# Patient Record
Sex: Female | Born: 1935 | Race: White | Hispanic: No | Smoking: Never smoker
Health system: Southern US, Community
[De-identification: ages and names within clinical notes are randomized; demographics above are authoritative.]

## PROBLEM LIST (undated history)

## (undated) DIAGNOSIS — S32592A Other specified fracture of left pubis, initial encounter for closed fracture: Secondary | ICD-10-CM

## (undated) DIAGNOSIS — H3530 Unspecified macular degeneration: Secondary | ICD-10-CM

## (undated) DIAGNOSIS — N390 Urinary tract infection, site not specified: Secondary | ICD-10-CM

## (undated) DIAGNOSIS — M1990 Unspecified osteoarthritis, unspecified site: Secondary | ICD-10-CM

## (undated) DIAGNOSIS — F419 Anxiety disorder, unspecified: Secondary | ICD-10-CM

## (undated) DIAGNOSIS — I10 Essential (primary) hypertension: Secondary | ICD-10-CM

## (undated) DIAGNOSIS — S43006A Unspecified dislocation of unspecified shoulder joint, initial encounter: Secondary | ICD-10-CM

## (undated) DIAGNOSIS — F40240 Claustrophobia: Secondary | ICD-10-CM

## (undated) DIAGNOSIS — B029 Zoster without complications: Secondary | ICD-10-CM

## (undated) DIAGNOSIS — E039 Hypothyroidism, unspecified: Secondary | ICD-10-CM

## (undated) HISTORY — PX: FRACTURE SURGERY: SHX138

## (undated) HISTORY — PX: TONSILLECTOMY: SUR1361

## (undated) HISTORY — PX: APPENDECTOMY: SHX54

## (undated) HISTORY — PX: THYROIDECTOMY: SHX17

## (undated) HISTORY — PX: COLONOSCOPY: SHX174

## (undated) HISTORY — PX: CATARACT EXTRACTION W/ INTRAOCULAR LENS  IMPLANT, BILATERAL: SHX1307

## (undated) HISTORY — PX: CATARACT EXTRACTION, BILATERAL: SHX1313

## (undated) HISTORY — PX: CHOLECYSTECTOMY: SHX55

---

## 1997-11-23 ENCOUNTER — Ambulatory Visit (HOSPITAL_COMMUNITY): Admission: RE | Admit: 1997-11-23 | Discharge: 1997-11-23 | Payer: Self-pay | Admitting: Endocrinology

## 1998-05-06 ENCOUNTER — Other Ambulatory Visit: Admission: RE | Admit: 1998-05-06 | Discharge: 1998-05-06 | Payer: Self-pay | Admitting: Endocrinology

## 1999-05-19 ENCOUNTER — Other Ambulatory Visit: Admission: RE | Admit: 1999-05-19 | Discharge: 1999-05-19 | Payer: Self-pay | Admitting: Endocrinology

## 2000-05-24 ENCOUNTER — Other Ambulatory Visit: Admission: RE | Admit: 2000-05-24 | Discharge: 2000-05-24 | Payer: Self-pay | Admitting: Endocrinology

## 2001-10-27 HISTORY — PX: HIP FRACTURE SURGERY: SHX118

## 2001-10-30 ENCOUNTER — Encounter: Payer: Self-pay | Admitting: Emergency Medicine

## 2001-10-30 ENCOUNTER — Inpatient Hospital Stay (HOSPITAL_COMMUNITY): Admission: EM | Admit: 2001-10-30 | Discharge: 2001-11-04 | Payer: Self-pay | Admitting: Emergency Medicine

## 2001-10-30 ENCOUNTER — Encounter: Payer: Self-pay | Admitting: Orthopedic Surgery

## 2001-11-04 ENCOUNTER — Inpatient Hospital Stay
Admission: AD | Admit: 2001-11-04 | Discharge: 2001-11-15 | Payer: Self-pay | Admitting: Physical Medicine & Rehabilitation

## 2002-06-26 ENCOUNTER — Other Ambulatory Visit: Admission: RE | Admit: 2002-06-26 | Discharge: 2002-06-26 | Payer: Self-pay | Admitting: Endocrinology

## 2002-12-09 ENCOUNTER — Ambulatory Visit (HOSPITAL_COMMUNITY): Admission: RE | Admit: 2002-12-09 | Discharge: 2002-12-09 | Payer: Self-pay | Admitting: *Deleted

## 2002-12-09 ENCOUNTER — Encounter (INDEPENDENT_AMBULATORY_CARE_PROVIDER_SITE_OTHER): Payer: Self-pay | Admitting: Specialist

## 2006-04-12 ENCOUNTER — Ambulatory Visit (HOSPITAL_COMMUNITY): Admission: RE | Admit: 2006-04-12 | Discharge: 2006-04-12 | Payer: Self-pay | Admitting: *Deleted

## 2007-07-13 ENCOUNTER — Emergency Department (HOSPITAL_COMMUNITY): Admission: EM | Admit: 2007-07-13 | Discharge: 2007-07-13 | Payer: Self-pay | Admitting: Emergency Medicine

## 2008-10-05 ENCOUNTER — Emergency Department (HOSPITAL_COMMUNITY): Admission: EM | Admit: 2008-10-05 | Discharge: 2008-10-05 | Payer: Self-pay | Admitting: Emergency Medicine

## 2010-08-14 ENCOUNTER — Emergency Department (HOSPITAL_COMMUNITY)
Admission: EM | Admit: 2010-08-14 | Discharge: 2010-08-14 | Disposition: A | Discharge status: Home / Self Care | Payer: Medicare Other | Attending: Emergency Medicine | Admitting: Emergency Medicine

## 2010-08-14 ENCOUNTER — Emergency Department (HOSPITAL_COMMUNITY): Payer: Medicare Other

## 2010-08-14 DIAGNOSIS — S42213A Unspecified displaced fracture of surgical neck of unspecified humerus, initial encounter for closed fracture: Secondary | ICD-10-CM | POA: Insufficient documentation

## 2010-08-14 DIAGNOSIS — E039 Hypothyroidism, unspecified: Secondary | ICD-10-CM | POA: Insufficient documentation

## 2010-08-14 DIAGNOSIS — W1809XA Striking against other object with subsequent fall, initial encounter: Secondary | ICD-10-CM | POA: Insufficient documentation

## 2010-08-14 DIAGNOSIS — M25519 Pain in unspecified shoulder: Secondary | ICD-10-CM | POA: Insufficient documentation

## 2010-08-14 DIAGNOSIS — I10 Essential (primary) hypertension: Secondary | ICD-10-CM | POA: Insufficient documentation

## 2010-10-14 NOTE — Discharge Summary (Signed)
[place]. [facility]  Patient:    Misty Shah, Misty Shah Visit Number: [id number] MRN: [id number]          Service Type: ECR Location: SACU 4524 01 Attending Physician:  Herold Harms Dictated by:   Jamelle Rushing, P.A. Admit Date:  11/04/2001 Discharge Date: 11/15/2001                             Discharge Summary  ADMISSION DIAGNOSIS: 1. Right intertrochanteric femur fracture. 2. Hypertension. 3. Osteoporosis. 4. Hypothyroidism. 5. Anxiety, claustrophobia. 6. Diverticulosis. 7. Hemorrhoids.  DISCHARGE DIAGNOSES: 1. Right hip intertrochanteric fracture repair with compression screw and    plate. 2. Postoperative blood loss anemia. 3. Hypertension. 4. Anxiety, claustrophobia. 5. Diverticulosis. 6. Osteoporosis.  HISTORY OF PRESENT ILLNESS:  The patient is an 77 year old white female who was sleeping on the couch on the evening of the injury and tried to get up. She lost her balance and fell, landing on her right side. The patient complained of immediate right groin pain. She denied any chest pain, shortness of breath, vertigo or loss of consciousness. She was unable to stand. She crawled to get help via the phone. She was brought to the emergency room by EMS for evaluation.  ALLERGIES:  NOVOCAINE WITH EPINEPHRINE CAUSING PALPITATIONS AND HOT FLASHES.  CURRENT MEDICATIONS:  1. Demodex 20 mg p.o. q.d.  2. Lotrel 10/20 mg p.o. q.d.  3. Synthroid 100 mcg p.o. q.d.  4. Toprol XL 100 mg p.o. q.d.  5. Aspirin 81 mg p.o. q.d.  6. Vitamin C 500 mg p.o. q.d.  7. Multivitamins 1 tablet p.o. q.d.  8. Valium 2 mg p.o. q.d. p.r.n.  9. Allegra 180 mg p.o. q.d. p.r.n. 10. Hydrocortisone cream p.r. p.r.n.  SURGICAL PROCEDURE:  On October 30, 2001, the patient was taken to the operating room by Dr. Jonny Ruiz L. Rendall, assisted by Maple Hudson, P.A.-S. Under general anesthesia the patient underwent a manipulation and compression screw fixation of her right hip  fracture. The patient tolerated the procedure well. The operative time was approximately 52 minutes with approximately 250 cc of blood loss. The patient tolerated the procedure well and was returned to the recovery room in good condition.  CONSULTS: 1. Routine physical therapy. 2. Occupational therapy. 3. Case Production designer, theatre/television/film. 4. Rehabilitation consult was requested. 5. Medical consult with Dr. Renne Crigler was requested for evaluation of the    patients hypertension status.  HOSPITAL COURSE:  On October 30, 2001, the patient was admitted to [facility] through the emergency room and was taken to the operating room for a manipulation and compression screw fixation of her right intertrochanteric fracture. The patient tolerated the procedure well and was transferred to the PACU and then to the orthopedic floor for routine postoperative care. The patient then incurred some postoperative blood loss anemia with low blood pressures and feeling tired and worn out. She was transfused 2 units of packed red blood cells and her hemoglobin and hematocrit jumped from a 7.6 and 21.9 to a 10.7 and 31.0. The patient received a total of 2 units of packed red blood cells.  The patients blood pressure medications were held and she still continued to have low 130s to 100s systolic pressure, so the patients primary care physician was consulted for evaluation of her blood pressure medications. This was done. The medications were adjusted and continued follow up on an outpatient basis was recommended.  Otherwise the patients wound  remained benign. Her other vital signs remained stable. Her labs also remained stable. She worked well with physical therapy and it was felt that due to her progression and her needs postoperatively that a stay on the subacute care unit would be beneficial prior to being discharged to home. Recommendations were made and she was in fact accepted on postoperative day #5 and  transferred.  LABORATORY DATA:  Electrocardiogram on admission was normal sinus rhythm, ST abnormality, possible digitalis effect at 85 beats per minute, PRT axis 77, 10 and 66. Chest x-ray showed COPD, no acute disease.  A CBC on November 03, 2001, WBCs 9.5, hemoglobin 10.1, hematocrit 29.4, platelets 184. November 01, 2001, routine chemistries, sodium 137, potassium 3.4, glucose 137 down from a high of 219, BUN 11, creatinine 0.8.  DISCHARGE MEDICATIONS:  On discharge to the subacute care unit.  1. Lotensin 20 mg p.o. q.d.  2. Synthroid 100 mcg p.o. q.d.  3. Toprol XL 100 mg p.o. q.d.  4. Multivitamins with minerals 1 capsule p.o. q.d.  5. Ascorbic acid 500 mg p.o. q.d.  6. Arixtra 2.5 mg subcu q.24h.  7. Colace 100 mg p.o. b.i.d.  8. Reglan 10 mg p.o. q.4-6h. p.r.n.  9. Valium 2 mg p.o. q.d. p.r.n. 10. Claritin 10 mg p.o. q.d. p.r.n. 11. Anusol p.r.n. 12. Laxative or enema or choice p.r.n. 13. Tylenol 325 mg p.o. q.4-6h. p.r.n. 14. Percocet 1 to 2 p.o. q.4-6h. p.r.n. 15. Robaxin 500 mg p.o. q.4-6h. p.r.n. 16. Ambien 5 to 10 mg p.o. q.h.s. p.r.n. 17. Vicodin 1 to 2 tablets q.4-6h. p.r.n.  DISCHARGE INSTRUCTIONS:  On discharge to the SACU.  Medications, the patient is to continue medications as dispensed on the orthopedics floor. Arixtra is to be continued for a total of 10 days. Diet no restrictions. Activity, the patient may be weightbearing as tolerated with close supervision and the use of a walker. Wound care, the patient should have her wound checked daily for any signs of infection. Staples are to be removed on postoperative day #14.  FOLLOWUP:  The patient should have a followup appointment in one week from the day of discharge from the rehabilitation unit with Dr. Priscille Kluver.  The patient is to call for an appointment.  CONDITION ON DISCHARGE:  On discharge to the subacute care unit is listed as good. Dictated by:   Jamelle Rushing, P.A. Attending Physician:  Herold Harms DD:  12/01/01 TD:  12/03/01 Job: [id number]

## 2010-10-14 NOTE — Op Note (Signed)
   NAME:  Misty Shah, Misty Shah                            ACCOUNT NO.:  [id number]   MEDICAL RECORD NO.:  [id number]                   PATIENT TYPE:  AMB   LOCATION:  ENDO                                 FACILITY:  [facility]   PHYSICIAN:  Georgiana Spinner, M.D.                 DATE OF BIRTH:  21-Jan-1916   DATE OF PROCEDURE:  DATE OF DISCHARGE:                                 OPERATIVE REPORT   PROCEDURE:  Colonoscopy with biopsy.   INDICATIONS:  Polyp.   ANESTHESIA:  Fentanyl 50 micrograms, Versed 5 mg.   DESCRIPTION OF PROCEDURE:  With the patient mildly sedated in the left  lateral decubitus position, the Olympus videoscopic colonoscope was inserted  in the rectum and passed under direct vision to the cecum, identified by the  crow's foot of the cecum and ileocecal valve, both of which were  photographed.  Near the cecum, was an area that was raised that might have  bee a flat polyp.  It was photographed and was removed using the hot biopsy  forceps technique, setting of 20:20 blended current.  The endoscope then was  withdrawn taking circumferential views of the remaining colonic mucosa  stopping to photograph diverticula along the way, and a very tortuous type  sigmoid colon until it reached the rectum which appeared normal on direct  and showed hemorrhoids on retroflexed view.  The endoscope was straightened  and withdrawn.  The patient's vital signs and pulse oximeter remained  stable.  The patient tolerated the procedure well with no apparent  complications.   FINDINGS:  Diverticulosis of the sigmoid colon, small internal hemorrhoids,  question of polyp at the cecum.   PLAN:  Await pathology report.  The patient will call in for results and  follow up with me as an outpatient.                                               Georgiana Spinner, M.D.    GMO/MEDQ  D:  12/09/2002  T:  12/09/2002  Job:  [id number]

## 2010-10-14 NOTE — Discharge Summary (Signed)
[place]. [facility]  Patient:    Misty Shah, Misty Shah Visit Number: [id number] MRN: [id number]          Service Type: ECR Location: SACU 4524 01 Attending Physician:  Herold Harms Dictated by:   Mcarthur Rossetti. Angiulli, P.A. Admit Date:  11/04/2001 Discharge Date: 11/15/2001   CC:         Misty Shah, M.D.  Misty Shah, M.D.  Misty Shah, M.D.   Discharge Summary  DISCHARGE DIAGNOSES: 1. Right displaced intertrochanteric hip fracture, status post compression    screw fixation, October 30, 2001. 2. Anemia, orthostatic blood pressure resolved. 3. Anxiety. 4. Hypothyroidism. 5. Diverticulosis.  HISTORY OF PRESENT ILLNESS:  [name] admitted on June 4, after a fall without loss of consciousness, sustaining a right displaced intertrochanteric hip fracture.  Underwent compression screw fixation on June 4, per Dr. Priscille Kluver.  Placed on Arixtra for deep venous thrombosis prophylaxis and 50% partial weightbearing.  Postoperative anemia and transfused.  Foley catheter tube removed on November 02, 2001.  Blood pressures with some decrease in changes, orthostatic.  Her Norvasc and Demadex were held per Dr. Juleen China. There was no chest pain or shortness of breath, minimal assistance for ambulation. Latest hemoglobin 10.1, chemistries unremarkable.  Admitted for comprehensive rehabilitation program.  PAST MEDICAL HISTORY:  See discharge diagnoses.  PAST SURGICAL HISTORY:  Cholecystectomy, thyroid surgery, bladder tacking, tonsillectomy, cataract surgery.  ALLERGIES:  NOVOCAINE, EPINEPHRINE.  PRIMARY M.D.:  W. Adela Lank, M.D.  SOCIAL HISTORY:  No alcohol or tobacco.  MEDICATIONS:  Prior to admission medications were Demadex, Lotrelle, Synthroid, Toprol XL, aspirin, vitamin C, Valium, and Allegra.  SOCIAL HISTORY:  Widowed, lives alone in two-level home, one to two steps to entry.  Bedroom is downstairs.  Family in area.  HOSPITAL  COURSE:  The patient did well while on rehabilitation services with therapies initiated daily.  The following issues were followed during the patients rehabilitation course.  Pertaining to [name] right hip intertrochanteric hip fracture, she had undergone compression screw fixation on October 30, 2001, per Dr. Priscille Kluver.  She was 50% partial weightbearing, surgical site healing nicely and no signs of infection.  She had completed her course of Arixtra for deep venous thrombosis prophylaxis.  Venous Doppler studies were negative.  Postoperative anemia is stable at 9.4, hematocrit 27.  Early on hospital course with orthostatic blood pressure changes, her Norvasc and Demadex were held.  She now continued on her Toprol as prior to hospital admission as well as Lotensin.  The patient had been on Lotrel prior to hospital admission and all details had been discussed with the patient.  She would follow up with her primary M.D.  Her blood pressures had remained well controlled with no signs of fluid overload and no headache or dizziness. Diastolic pressure is 55 to 61.  She had a long history of anxiety.  She used Valium as needed on a very limited basis.  Hormone supplement ongoing for hypothyroidism.  She had no gastrointestinal complaints.  She had been on aspirin prior to hospital admission.  This has been resumed at the time of discharge.  Overall for her functional mobility, she was standby assistance for transfers, minimal assistance for steps, ambulating 150 feet at supervision level, simple setup for upper body activities of daily living and lower body dressing.  Home Health physical and occupational therapy would be ongoing.  Latest labs showed a sodium of 135, potassium 4.0, BUN 8, creatinine  0.7, hemoglobin 9.4, hematocrit 27.7.  DISCHARGE MEDICATIONS: 1. Lotensin 20 mg daily. 2. Synthroid 100 mcg daily. 3. Toprol XL 100 mg daily. 4. Multivitamin daily. 5. Vitamin C 500 mg daily. 6.  Aspirin 81 mg daily. 7. Trinsicon twice daily. 8. Valium 2 mg daily as needed. 9. Tylox as needed for pain.  ACTIVITY:  Partial weightbearing with walker.  DIET:  Regular.  WOUND CARE:  Cleanse incision daily with warm soap and water.  DISCHARGE INSTRUCTIONS:  Home Health physical and occupational therapy.  FOLLOW-UP:  The patient should follow up with Dr. Juleen China for medical management, adjustments in blood pressure medications at his discretion. Dictated by:   Mcarthur Rossetti. Angiulli, P.A. Attending Physician:  Herold Harms DD:  11/14/01 TD:  11/15/01 Job: [id number]

## 2010-10-14 NOTE — Op Note (Signed)
[place]. [facility]  Patient:    Misty Shah, DEARMOND Visit Number: [id number] MRN: [id number]          Service Type: MED Location: [id number] Attending Physician:  Carolan Shiver Ii Dictated by:   Carlisle Beers. Dorothyann Gibbs, M.D. Proc. Date: 10/30/01 Admit Date:  10/30/2001                             Operative Report  PREOPERATIVE DIAGNOSIS:  Intertrochanteric fracture, right hip, displaced.  POSTOPERATIVE DIAGNOSIS:  Intertrochanteric fracture, right hip, displaced.  PROCEDURE:  Manipulation and compression screw fixation, right hip fracture.  SURGEON:  John L. Dorothyann Gibbs, M.D.  Threasa HeadsLorin Picket, P.A.-S.  ANESTHESIA:  General.  PATHOLOGY:  The patient is an 77 year old with a displaced intertrochanteric fracture, right hip, a relatively valgus hip.  DESCRIPTION OF PROCEDURE:  Under general anesthesia, the patient is placed on the [facility] table and the right hip is brought into traction and gently manipulated.  C-arm reveals much improved position and alignment.  The hip is then prepared with Duraprep and draped with a vertical hanging plastic field. A guidewire is placed along the femoral shaft for assistance in positioning the skin incision with the C-arm, and then a six-inch lateral hip incision is then made vertically.  Dissection is carried through subcutaneous tissues, splitting the IT band in the line of its fibers.  The vastus lateralis is released posteriorly and elevated from the cortex of the lateral femur, exposing the area just beneath the greater trochanter.  A guidewire is placed up the femoral neck into the femoral head and on the second try, good position and alignment were obtained on the AP and lateral views.  This was then measured for depth and an 85 mm reaming was then done and an 85 mm, five-hole, 145 degree side plate was attached to the femoral shaft.  It was approximated to the shaft with a [place] claw, and five screws were  placed.  AP and lateral x-rays revealed excellent fit and alignment of the device in place.  The wound was then irrigated and closed in layers with #1 Vicryl, 0 Vicryl, and 2-0 Vicryl and skin clips.  Operative time approximately 52 minutes.  Blood loss estimated at 250 cc.  A sterile compression bandage was applied, and the patient returned to recovery in good condition. Dictated by:   Carlisle Beers. Dorothyann Gibbs, M.D. Attending Physician:  Carolan Shiver Ii DD:  10/30/01 TD:  11/01/01 Job: [id number]

## 2010-10-14 NOTE — Op Note (Signed)
NAMEKIELY, COUSAR                  ACCOUNT NO.:  [id number]   MEDICAL RECORD NO.:  [id number]          PATIENT TYPE:  AMB   LOCATION:  ENDO                         FACILITY:  [facility]   PHYSICIAN:  Georgiana Spinner, M.D.    DATE OF BIRTH:  11-07-1915   DATE OF PROCEDURE:  DATE OF DISCHARGE:                                 OPERATIVE REPORT   PROCEDURE:  Colonoscopy.   INDICATIONS:  Rectal bleeding.   ANESTHESIA:  Fentanyl 60 mg, Versed 6 mg.   DESCRIPTION OF PROCEDURE:  With patient mildly sedated in the left lateral  decubitus position, the Olympus video colonoscope was inserted into the  rectum and passed under direct digital pressure, whereupon we reached the  cecum, identified by ileocecal valve and base of cecum, both of which were  photographed.  From this point, the colonoscope was slowly withdrawn, taking  circumferential views of colonic mucosa, stopping in the rectum which  appeared normal and showed hemorrhoids on retroflexed view.  The endoscope  was straightened and withdrawn.  The patient's vital signs and pulse  oximeter remained stable.  The patient tolerated the procedure well with no  apparent complications.   FINDINGS:  Occasional diverticula and internal hemorrhoids, otherwise  unremarkable exam.   PLAN:  Have the patient follow up with me on as needed basis only.           ______________________________  Georgiana Spinner, M.D.     GMO/MEDQ  D:  04/12/2006  T:  04/13/2006  Job:  [id number]

## 2011-09-25 ENCOUNTER — Inpatient Hospital Stay (HOSPITAL_COMMUNITY)
Admission: EM | Admit: 2011-09-25 | Discharge: 2011-09-28 | DRG: 481 | Disposition: A | Discharge status: Rehabilitation Facility | Payer: Medicare Other | Attending: Internal Medicine | Admitting: Internal Medicine

## 2011-09-25 ENCOUNTER — Inpatient Hospital Stay (HOSPITAL_COMMUNITY): Payer: Medicare Other | Admitting: Anesthesiology

## 2011-09-25 ENCOUNTER — Emergency Department (HOSPITAL_COMMUNITY): Payer: Medicare Other

## 2011-09-25 ENCOUNTER — Inpatient Hospital Stay (HOSPITAL_COMMUNITY): Payer: Medicare Other

## 2011-09-25 ENCOUNTER — Encounter (HOSPITAL_COMMUNITY): Payer: Self-pay | Admitting: Anesthesiology

## 2011-09-25 ENCOUNTER — Encounter (HOSPITAL_COMMUNITY): Payer: Self-pay | Admitting: *Deleted

## 2011-09-25 ENCOUNTER — Encounter (HOSPITAL_COMMUNITY)
Admission: EM | Disposition: A | Discharge status: Rehabilitation Facility | Payer: Self-pay | Source: Home / Self Care | Attending: Internal Medicine

## 2011-09-25 DIAGNOSIS — E039 Hypothyroidism, unspecified: Secondary | ICD-10-CM | POA: Diagnosis present

## 2011-09-25 DIAGNOSIS — S72143A Displaced intertrochanteric fracture of unspecified femur, initial encounter for closed fracture: Principal | ICD-10-CM | POA: Diagnosis present

## 2011-09-25 DIAGNOSIS — B961 Klebsiella pneumoniae [K. pneumoniae] as the cause of diseases classified elsewhere: Secondary | ICD-10-CM | POA: Diagnosis present

## 2011-09-25 DIAGNOSIS — S72009A Fracture of unspecified part of neck of unspecified femur, initial encounter for closed fracture: Secondary | ICD-10-CM

## 2011-09-25 DIAGNOSIS — Z9181 History of falling: Secondary | ICD-10-CM

## 2011-09-25 DIAGNOSIS — N390 Urinary tract infection, site not specified: Secondary | ICD-10-CM | POA: Diagnosis present

## 2011-09-25 DIAGNOSIS — E876 Hypokalemia: Secondary | ICD-10-CM | POA: Diagnosis not present

## 2011-09-25 DIAGNOSIS — W010XXA Fall on same level from slipping, tripping and stumbling without subsequent striking against object, initial encounter: Secondary | ICD-10-CM | POA: Diagnosis present

## 2011-09-25 DIAGNOSIS — I10 Essential (primary) hypertension: Secondary | ICD-10-CM | POA: Diagnosis present

## 2011-09-25 DIAGNOSIS — M810 Age-related osteoporosis without current pathological fracture: Secondary | ICD-10-CM | POA: Diagnosis present

## 2011-09-25 DIAGNOSIS — Y92009 Unspecified place in unspecified non-institutional (private) residence as the place of occurrence of the external cause: Secondary | ICD-10-CM

## 2011-09-25 DIAGNOSIS — D62 Acute posthemorrhagic anemia: Secondary | ICD-10-CM | POA: Diagnosis not present

## 2011-09-25 HISTORY — DX: Anxiety disorder, unspecified: F41.9

## 2011-09-25 HISTORY — DX: Claustrophobia: F40.240

## 2011-09-25 HISTORY — PX: FEMUR IM NAIL: SHX1597

## 2011-09-25 HISTORY — DX: Urinary tract infection, site not specified: N39.0

## 2011-09-25 HISTORY — DX: Essential (primary) hypertension: I10

## 2011-09-25 HISTORY — DX: Zoster without complications: B02.9

## 2011-09-25 HISTORY — DX: Unspecified dislocation of unspecified shoulder joint, initial encounter: S43.006A

## 2011-09-25 HISTORY — DX: Hypothyroidism, unspecified: E03.9

## 2011-09-25 HISTORY — DX: Unspecified osteoarthritis, unspecified site: M19.90

## 2011-09-25 LAB — URINALYSIS, ROUTINE W REFLEX MICROSCOPIC
Bilirubin Urine: NEGATIVE
Nitrite: NEGATIVE
Protein, ur: NEGATIVE mg/dL
Specific Gravity, Urine: 1.016 (ref 1.005–1.030)
Urobilinogen, UA: 0.2 mg/dL (ref 0.0–1.0)

## 2011-09-25 LAB — CBC
HCT: 34.8 % — ABNORMAL LOW (ref 36.0–46.0)
HCT: 31.1 % — ABNORMAL LOW (ref 36.0–46.0)
Hemoglobin: 10.8 g/dL — ABNORMAL LOW (ref 12.0–15.0)
MCH: 30.5 pg (ref 26.0–34.0)
MCHC: 34.7 g/dL (ref 30.0–36.0)
MCHC: 34.3 g/dL (ref 30.0–36.0)
Platelets: 178 10*3/uL (ref 150–400)
RDW: 12.2 % (ref 11.5–15.5)
RDW: 12.5 % (ref 11.5–15.5)
RDW: 12.5 % (ref 11.5–15.5)
WBC: 17 10*3/uL — ABNORMAL HIGH (ref 4.0–10.5)
WBC: 11.4 10*3/uL — ABNORMAL HIGH (ref 4.0–10.5)

## 2011-09-25 LAB — COMPREHENSIVE METABOLIC PANEL
ALT: 8 U/L (ref 0–35)
AST: 24 U/L (ref 0–37)
Albumin: 4.2 g/dL (ref 3.5–5.2)
CO2: 26 mEq/L (ref 19–32)
Chloride: 92 mEq/L — ABNORMAL LOW (ref 96–112)
GFR calc non Af Amer: 71 mL/min — ABNORMAL LOW (ref >90)
Potassium: 4 mEq/L (ref 3.5–5.1)
Sodium: 131 mEq/L — ABNORMAL LOW (ref 135–145)
Total Bilirubin: 0.6 mg/dL (ref 0.3–1.2)

## 2011-09-25 LAB — DIFFERENTIAL
Basophils Absolute: 0 10*3/uL (ref 0.0–0.1)
Basophils Relative: 0 % (ref 0–1)
Lymphocytes Relative: 6 % — ABNORMAL LOW (ref 12–46)
Neutro Abs: 15.2 10*3/uL — ABNORMAL HIGH (ref 1.7–7.7)
Neutrophils Relative %: 89 % — ABNORMAL HIGH (ref 43–77)

## 2011-09-25 LAB — URINE MICROSCOPIC-ADD ON

## 2011-09-25 LAB — ABO/RH: ABO/RH(D): B POS

## 2011-09-25 LAB — MAGNESIUM: Magnesium: 1.3 mg/dL — ABNORMAL LOW (ref 1.5–2.5)

## 2011-09-25 SURGERY — INSERTION, INTRAMEDULLARY ROD, FEMUR
Anesthesia: General | Site: Hip | Laterality: Left | Wound class: Clean

## 2011-09-25 MED ORDER — ONDANSETRON HCL 4 MG/2ML IJ SOLN: 4.0000 mg | Freq: Four times a day (QID) | INTRAMUSCULAR | Status: DC | PRN

## 2011-09-25 MED ORDER — FENTANYL CITRATE 0.05 MG/ML IJ SOLN
INTRAMUSCULAR | Status: DC | PRN
  Administered 2011-09-25: 50 ug via INTRAVENOUS

## 2011-09-25 MED ORDER — LEVOTHYROXINE SODIUM 100 MCG PO TABS
100.0000 ug | ORAL_TABLET | Freq: Every day | ORAL | Status: DC
  Administered 2011-09-26 – 2011-09-28 (×3): 100 ug via ORAL
  Filled 2011-09-25 (×4): qty 1

## 2011-09-25 MED ORDER — PROMETHAZINE HCL 25 MG/ML IJ SOLN: 6.2500 mg | INTRAMUSCULAR | Status: DC | PRN

## 2011-09-25 MED ORDER — ONDANSETRON HCL 4 MG PO TABS: 4.0000 mg | ORAL_TABLET | Freq: Four times a day (QID) | ORAL | Status: DC | PRN

## 2011-09-25 MED ORDER — ONDANSETRON HCL 4 MG/2ML IJ SOLN
INTRAMUSCULAR | Status: DC | PRN
  Administered 2011-09-25: 4 mg via INTRAVENOUS

## 2011-09-25 MED ORDER — MENTHOL 3 MG MT LOZG
1.0000 | LOZENGE | OROMUCOSAL | Status: DC | PRN
  Filled 2011-09-25: qty 9

## 2011-09-25 MED ORDER — BENAZEPRIL-HYDROCHLOROTHIAZIDE 20-12.5 MG PO TABS: 1.0000 | ORAL_TABLET | Freq: Every day | ORAL | Status: DC

## 2011-09-25 MED ORDER — FENTANYL CITRATE 0.05 MG/ML IJ SOLN: 25.0000 ug | INTRAMUSCULAR | Status: DC | PRN

## 2011-09-25 MED ORDER — METOPROLOL TARTRATE 100 MG PO TABS
100.0000 mg | ORAL_TABLET | Freq: Two times a day (BID) | ORAL | Status: DC
  Administered 2011-09-25 – 2011-09-28 (×6): 100 mg via ORAL
  Filled 2011-09-25 (×7): qty 1

## 2011-09-25 MED ORDER — LEVALBUTEROL HCL 0.63 MG/3ML IN NEBU
0.6300 mg | INHALATION_SOLUTION | Freq: Four times a day (QID) | RESPIRATORY_TRACT | Status: DC
  Administered 2011-09-26: 0.63 mg via RESPIRATORY_TRACT
  Filled 2011-09-25 (×8): qty 3

## 2011-09-25 MED ORDER — DEXTROSE 5 % IV SOLN
1.0000 g | INTRAVENOUS | Status: DC
  Administered 2011-09-25 – 2011-09-27 (×3): 1 g via INTRAVENOUS
  Filled 2011-09-25 (×3): qty 10

## 2011-09-25 MED ORDER — ACETAMINOPHEN 650 MG RE SUPP: 650.0000 mg | Freq: Four times a day (QID) | RECTAL | Status: DC | PRN

## 2011-09-25 MED ORDER — BENAZEPRIL HCL 20 MG PO TABS
20.0000 mg | ORAL_TABLET | Freq: Every day | ORAL | Status: DC
  Administered 2011-09-26 – 2011-09-28 (×3): 20 mg via ORAL
  Filled 2011-09-25 (×3): qty 1

## 2011-09-25 MED ORDER — METOCLOPRAMIDE HCL 5 MG/ML IJ SOLN: 5.0000 mg | Freq: Three times a day (TID) | INTRAMUSCULAR | Status: DC | PRN

## 2011-09-25 MED ORDER — PHENOL 1.4 % MT LIQD
1.0000 | OROMUCOSAL | Status: DC | PRN
  Administered 2011-09-25 – 2011-09-26 (×2): 1 via OROMUCOSAL
  Filled 2011-09-25: qty 177

## 2011-09-25 MED ORDER — SENNA 8.6 MG PO TABS: 1.0000 | ORAL_TABLET | Freq: Two times a day (BID) | ORAL | Status: DC

## 2011-09-25 MED ORDER — ACETAMINOPHEN 10 MG/ML IV SOLN
INTRAVENOUS | Status: AC
  Filled 2011-09-25: qty 100

## 2011-09-25 MED ORDER — METHOCARBAMOL 100 MG/ML IJ SOLN
500.0000 mg | Freq: Four times a day (QID) | INTRAVENOUS | Status: DC | PRN
  Filled 2011-09-25: qty 5

## 2011-09-25 MED ORDER — DOCUSATE SODIUM 100 MG PO CAPS: 100.0000 mg | ORAL_CAPSULE | Freq: Two times a day (BID) | ORAL | Status: DC

## 2011-09-25 MED ORDER — OXYCODONE-ACETAMINOPHEN 5-325 MG PO TABS
1.0000 | ORAL_TABLET | Freq: Once | ORAL | Status: AC
  Administered 2011-09-25: 1 via ORAL
  Filled 2011-09-25: qty 1

## 2011-09-25 MED ORDER — SODIUM CHLORIDE 0.9 % IV SOLN
1000.0000 mL | INTRAVENOUS | Status: DC
  Administered 2011-09-25 (×2): via INTRAVENOUS
  Administered 2011-09-25: 1000 mL via INTRAVENOUS

## 2011-09-25 MED ORDER — METHOCARBAMOL 500 MG PO TABS: 500.0000 mg | ORAL_TABLET | Freq: Four times a day (QID) | ORAL | Status: DC | PRN

## 2011-09-25 MED ORDER — ENOXAPARIN SODIUM 40 MG/0.4ML ~~LOC~~ SOLN
40.0000 mg | SUBCUTANEOUS | Status: DC
  Administered 2011-09-25: 40 mg via SUBCUTANEOUS
  Filled 2011-09-25: qty 0.4

## 2011-09-25 MED ORDER — HYDROCHLOROTHIAZIDE 12.5 MG PO CAPS
12.5000 mg | ORAL_CAPSULE | Freq: Every day | ORAL | Status: DC
  Administered 2011-09-26 – 2011-09-28 (×3): 12.5 mg via ORAL
  Filled 2011-09-25 (×3): qty 1

## 2011-09-25 MED ORDER — ONDANSETRON HCL 4 MG/2ML IJ SOLN
4.0000 mg | Freq: Four times a day (QID) | INTRAMUSCULAR | Status: DC | PRN
  Administered 2011-09-26 – 2011-09-27 (×2): 4 mg via INTRAVENOUS
  Filled 2011-09-25 (×2): qty 2

## 2011-09-25 MED ORDER — BISACODYL 5 MG PO TBEC: 5.0000 mg | DELAYED_RELEASE_TABLET | Freq: Every day | ORAL | Status: DC | PRN

## 2011-09-25 MED ORDER — SUCCINYLCHOLINE CHLORIDE 20 MG/ML IJ SOLN
INTRAMUSCULAR | Status: DC | PRN
  Administered 2011-09-25: 80 mg via INTRAVENOUS

## 2011-09-25 MED ORDER — DOCUSATE SODIUM 100 MG PO CAPS
100.0000 mg | ORAL_CAPSULE | Freq: Two times a day (BID) | ORAL | Status: DC
  Administered 2011-09-25 – 2011-09-28 (×6): 100 mg via ORAL
  Filled 2011-09-25 (×7): qty 1

## 2011-09-25 MED ORDER — ACETAMINOPHEN 325 MG PO TABS: 650.0000 mg | ORAL_TABLET | Freq: Four times a day (QID) | ORAL | Status: DC | PRN

## 2011-09-25 MED ORDER — CEFAZOLIN SODIUM 1-5 GM-% IV SOLN
INTRAVENOUS | Status: DC | PRN
  Administered 2011-09-25: 1 g via INTRAVENOUS

## 2011-09-25 MED ORDER — METOCLOPRAMIDE HCL 10 MG PO TABS: 5.0000 mg | ORAL_TABLET | Freq: Three times a day (TID) | ORAL | Status: DC | PRN

## 2011-09-25 MED ORDER — FENTANYL CITRATE 0.05 MG/ML IJ SOLN
50.0000 ug | Freq: Once | INTRAMUSCULAR | Status: AC
  Administered 2011-09-25: 50 ug via INTRAVENOUS
  Filled 2011-09-25: qty 2

## 2011-09-25 MED ORDER — METOPROLOL TARTRATE 50 MG PO TABS
50.0000 mg | ORAL_TABLET | Freq: Two times a day (BID) | ORAL | Status: DC
  Filled 2011-09-25 (×2): qty 1

## 2011-09-25 MED ORDER — PROPOFOL 10 MG/ML IV BOLUS
INTRAVENOUS | Status: DC | PRN
  Administered 2011-09-25: 80 mg via INTRAVENOUS

## 2011-09-25 MED ORDER — ACETAMINOPHEN 10 MG/ML IV SOLN
1000.0000 mg | Freq: Four times a day (QID) | INTRAVENOUS | Status: DC
  Administered 2011-09-25: 1000 mg via INTRAVENOUS

## 2011-09-25 MED ORDER — HYDROMORPHONE HCL 1 MG/ML IJ SOLN
0.5000 mg | INTRAMUSCULAR | Status: DC | PRN
  Administered 2011-09-26 – 2011-09-28 (×4): 0.5 mg via INTRAVENOUS
  Filled 2011-09-25 (×4): qty 1

## 2011-09-25 MED ORDER — TRAMADOL HCL 50 MG PO TABS: 50.0000 mg | ORAL_TABLET | Freq: Four times a day (QID) | ORAL | Status: DC | PRN

## 2011-09-25 MED ORDER — EPHEDRINE SULFATE 50 MG/ML IJ SOLN
INTRAMUSCULAR | Status: DC | PRN
  Administered 2011-09-25: 5 mg via INTRAVENOUS

## 2011-09-25 MED ORDER — SODIUM CHLORIDE 0.9 % IV SOLN
INTRAVENOUS | Status: DC
  Administered 2011-09-25 – 2011-09-28 (×4): via INTRAVENOUS

## 2011-09-25 MED ORDER — ENOXAPARIN SODIUM 40 MG/0.4ML ~~LOC~~ SOLN
40.0000 mg | SUBCUTANEOUS | Status: DC
  Administered 2011-09-26 – 2011-09-28 (×3): 40 mg via SUBCUTANEOUS
  Filled 2011-09-25 (×3): qty 0.4

## 2011-09-25 MED ORDER — CEFAZOLIN SODIUM 1-5 GM-% IV SOLN
INTRAVENOUS | Status: AC
  Filled 2011-09-25: qty 50

## 2011-09-25 MED ORDER — LIDOCAINE HCL (CARDIAC) 20 MG/ML IV SOLN
INTRAVENOUS | Status: DC | PRN
  Administered 2011-09-25: 50 mg via INTRAVENOUS

## 2011-09-25 MED ORDER — AMLODIPINE BESYLATE 5 MG PO TABS
5.0000 mg | ORAL_TABLET | Freq: Every day | ORAL | Status: DC
  Administered 2011-09-26 – 2011-09-28 (×3): 5 mg via ORAL
  Filled 2011-09-25 (×3): qty 1

## 2011-09-25 SURGICAL SUPPLY — 39 items
BAG ZIPLOCK 12X15 (MISCELLANEOUS) ×2 IMPLANT
BANDAGE GAUZE ELAST BULKY 4 IN (GAUZE/BANDAGES/DRESSINGS) ×2 IMPLANT
BIT DRILL 4.3MMS DISTAL GRDTED (BIT) ×1 IMPLANT
BNDG COHESIVE 4X5 TAN STRL (GAUZE/BANDAGES/DRESSINGS) ×2 IMPLANT
CLOTH BEACON ORANGE TIMEOUT ST (SAFETY) ×2 IMPLANT
COVER MAYO STAND STRL (DRAPES) ×4 IMPLANT
DRAPE LG THREE QUARTER DISP (DRAPES) ×2 IMPLANT
DRAPE STERI IOBAN 125X83 (DRAPES) ×2 IMPLANT
DRILL 4.3MMS DISTAL GRADUATED (BIT) ×2
DRSG EMULSION OIL 3X16 NADH (GAUZE/BANDAGES/DRESSINGS) ×2 IMPLANT
DRSG MEPILEX BORDER 4X4 (GAUZE/BANDAGES/DRESSINGS) ×2 IMPLANT
DRSG MEPILEX BORDER 4X8 (GAUZE/BANDAGES/DRESSINGS) ×2 IMPLANT
DRSG PAD ABDOMINAL 8X10 ST (GAUZE/BANDAGES/DRESSINGS) ×4 IMPLANT
ELECT REM PT RETURN 9FT ADLT (ELECTROSURGICAL) ×2
ELECTRODE REM PT RTRN 9FT ADLT (ELECTROSURGICAL) ×1 IMPLANT
GLOVE BIO SURGEON STRL SZ7.5 (GLOVE) ×4 IMPLANT
GLOVE ECLIPSE 8.0 STRL XLNG CF (GLOVE) IMPLANT
GLOVE ORTHO TXT STRL SZ7.5 (GLOVE) ×4 IMPLANT
GLOVE SURG ORTHO 7.0 STRL STRW (GLOVE) ×2 IMPLANT
GUIDEPIN 3.2X17.5 THRD DISP (PIN) ×2 IMPLANT
GUIDEWIRE BALL NOSE 80CM (WIRE) ×2 IMPLANT
KIT BASIN OR (CUSTOM PROCEDURE TRAY) ×2 IMPLANT
MANIFOLD NEPTUNE II (INSTRUMENTS) ×2 IMPLANT
NAIL HIP FRA AFFIX 130X9X340 L (Nail) ×2 IMPLANT
NS IRRIG 1000ML POUR BTL (IV SOLUTION) ×2 IMPLANT
PACK GENERAL/GYN (CUSTOM PROCEDURE TRAY) ×2 IMPLANT
POSITIONER SURGICAL ARM (MISCELLANEOUS) ×2 IMPLANT
SCREW BONE CORTICAL 5.0X38 (Screw) ×2 IMPLANT
SCREW LAG HIP NAIL 10.5X95 (Screw) ×2 IMPLANT
SCREWDRIVER HEX TIP 3.5MM (MISCELLANEOUS) ×2 IMPLANT
SPONGE GAUZE 4X4 12PLY (GAUZE/BANDAGES/DRESSINGS) ×2 IMPLANT
STAPLER VISISTAT 35W (STAPLE) IMPLANT
STRIP CLOSURE SKIN 1/2X4 (GAUZE/BANDAGES/DRESSINGS) IMPLANT
SUT MNCRL AB 4-0 PS2 18 (SUTURE) ×2 IMPLANT
SUT VIC AB 1 CT1 27 (SUTURE) ×1
SUT VIC AB 1 CT1 27XBRD ANTBC (SUTURE) ×1 IMPLANT
SUT VIC AB 2-0 CT1 27 (SUTURE) ×1
SUT VIC AB 2-0 CT1 TAPERPNT 27 (SUTURE) ×1 IMPLANT
WATER STERILE IRR 1500ML POUR (IV SOLUTION) ×2 IMPLANT

## 2011-09-25 NOTE — ED Notes (Signed)
Attempted to call report Misty Shah, unable to take report at this time.

## 2011-09-25 NOTE — Transfer of Care (Signed)
Immediate Anesthesia Transfer of Care Note  Patient: Misty Shah  Procedure(s) Performed: Procedure(s) (LRB): INTRAMEDULLARY (IM) NAIL FEMORAL (Left)  Patient Location: PACU  Anesthesia Type: General  Level of Consciousness: sedated, patient cooperative and responds to stimulaton  Airway & Oxygen Therapy: Patient Spontanous Breathing and Patient connected to face mask oxgen  Post-op Assessment: Report given to PACU RN and Post -op Vital signs reviewed and stable  Post vital signs: Reviewed and stable  Complications: No apparent anesthesia complications

## 2011-09-25 NOTE — Anesthesia Preprocedure Evaluation (Signed)
Anesthesia Evaluation  Patient identified by MRN, date of birth, ID band Patient awake    Reviewed: Allergy & Precautions, H&P , NPO status , Patient's Chart, lab work & pertinent test results  Airway Mallampati: II TM Distance: <3 FB Neck ROM: Full    Dental No notable dental hx.    Pulmonary neg pulmonary ROS,  breath sounds clear to auscultation  Pulmonary exam normal       Cardiovascular hypertension, Pt. on medications negative cardio ROS  Rhythm:Regular Rate:Normal     Neuro/Psych negative neurological ROS  negative psych ROS   GI/Hepatic negative GI ROS, Neg liver ROS,   Endo/Other  negative endocrine ROSHypothyroidism   Renal/GU negative Renal ROS  negative genitourinary   Musculoskeletal negative musculoskeletal ROS (+)   Abdominal   Peds negative pediatric ROS (+)  Hematology negative hematology ROS (+)   Anesthesia Other Findings   Reproductive/Obstetrics negative OB ROS                           Anesthesia Physical Anesthesia Plan  ASA: III  Anesthesia Plan: General   Post-op Pain Management:    Induction: Intravenous  Airway Management Planned: Oral ETT  Additional Equipment:   Intra-op Plan:   Post-operative Plan: Extubation in OR  Informed Consent: I have reviewed the patients History and Physical, chart, labs and discussed the procedure including the risks, benefits and alternatives for the proposed anesthesia with the patient or authorized representative who has indicated his/her understanding and acceptance.   Dental advisory given  Plan Discussed with: CRNA  Anesthesia Plan Comments:         Anesthesia Quick Evaluation

## 2011-09-25 NOTE — ED Notes (Signed)
Per EMS pt bent down at home and fell on to left leg and has pain 5/10. Pt unable to ambulate. Swelling to left thigh. No obvious deformity. Vital in normal limits.

## 2011-09-25 NOTE — Anesthesia Postprocedure Evaluation (Signed)
  Anesthesia Post-op Note  Patient: Misty Shah  Procedure(s) Performed: Procedure(s) (LRB): INTRAMEDULLARY (IM) NAIL FEMORAL (Left)  Patient Location: PACU  Anesthesia Type: General  Level of Consciousness: awake and alert   Airway and Oxygen Therapy: Patient Spontanous Breathing  Post-op Pain: mild  Post-op Assessment: Post-op Vital signs reviewed, Patient's Cardiovascular Status Stable, Respiratory Function Stable, Patent Airway and No signs of Nausea or vomiting  Post-op Vital Signs: stable  Complications: No apparent anesthesia complications

## 2011-09-25 NOTE — Consult Note (Signed)
Norlene Campbell, MD                  Jacqualine Code, PA-C   [address] [place], [place], [place]  [address]   ORTHOPAEDIC CONSULTATION  Misty Shah            MRN:  [id number] DOB/SEX:  June 16, 1915/female    REQUESTING PHYSICIAN:  Emergency Room Physician  CHIEF COMPLAINT:  Painful left hip.  HISTORY: Misty Shah a 78 y.o. female had fallen  Fallen this morning at about 7 am. She denies any chest pain palpitations lightheadedness prior to the fall. She states her carpet was rolled over next to the fireplace and she tried to bend over and fix it. She then fell injuring her left hip.  She laid there for some time before help arrived.  Brought to [facility] where x-rays revealed an intertrochanteric hip fracture.  We were asked to consult.     PAST MEDICAL HISTORY: Patient Active Problem List  Diagnoses Date Noted  . Hip fracture 09/25/2011  . Hypertension 09/25/2011  . Osteoporosis 09/25/2011  . Hypothyroidism 09/25/2011  . UTI (lower urinary tract infection) 09/25/2011   Past Medical History  Diagnosis Date  . Hypertension   . Arthritis   . Hypothyroidism   . UTI (lower urinary tract infection)   . Anxiety   . Claustrophobia   . Dislocated shoulder     hx bilateral shoulders - had therapy on them  . Shingles    Past Surgical History  Procedure Date  . Cholecystectomy   . Thyroidectomy   . Tonsillectomy   . Appendectomy   . Hip surgery     right hip  . Colonoscopy   . Cataract extraction, bilateral      MEDICATIONS:  Current facility-administered medications:0.9 %  sodium chloride infusion, 1,000 mL, Intravenous, Continuous, Hilario Quarry, MD, Last Rate: 125 mL/hr at 09/25/11 1310, 1,000 mL at 09/25/11 1310;  acetaminophen (TYLENOL) suppository 650 mg, 650 mg, Rectal, Q6H PRN, Richarda Overlie, MD;  acetaminophen (TYLENOL) tablet 650 mg, 650 mg, Oral, Q6H PRN, Richarda Overlie, MD cefTRIAXone (ROCEPHIN) 1 g in dextrose 5 % 50 mL IVPB, 1 g, Intravenous, Q24H, Richarda Overlie, MD,  1 g at 09/25/11 1722;  docusate sodium (COLACE) capsule 100 mg, 100 mg, Oral, BID, Richarda Overlie, MD;  enoxaparin (LOVENOX) injection 40 mg, 40 mg, Subcutaneous, Q24H, Richarda Overlie, MD, 40 mg at 09/25/11 1848;  fentaNYL (SUBLIMAZE) injection 50 mcg, 50 mcg, Intravenous, Once, Hilario Quarry, MD, 50 mcg at 09/25/11 1503 levalbuterol (XOPENEX) nebulizer solution 0.63 mg, 0.63 mg, Nebulization, Q6H, Richarda Overlie, MD;  metoprolol tartrate (LOPRESSOR) tablet 50 mg, 50 mg, Oral, BID, Richarda Overlie, MD;  ondansetron (ZOFRAN) injection 4 mg, 4 mg, Intravenous, Q6H PRN, Richarda Overlie, MD;  ondansetron (ZOFRAN) tablet 4 mg, 4 mg, Oral, Q6H PRN, Richarda Overlie, MD oxyCODONE-acetaminophen (PERCOCET) 5-325 MG per tablet 1 tablet, 1 tablet, Oral, Once, Hilario Quarry, MD, 1 tablet at 09/25/11 1231;  senna (SENOKOT) tablet 8.6 mg, 1 tablet, Oral, BID, Richarda Overlie, MD Current outpatient prescriptions:amLODipine (NORVASC) 5 MG tablet, Take 5 mg by mouth daily., Disp: , Rfl: ;  aspirin EC 81 MG tablet, Take 81 mg by mouth daily., Disp: , Rfl: ;  levothyroxine (SYNTHROID, LEVOTHROID) 100 MCG tablet, Take 100 mcg by mouth daily., Disp: , Rfl: ;  metoprolol (LOPRESSOR) 100 MG tablet, Take 100 mg by mouth 2 (two) times daily., Disp: , Rfl:  traMADol (ULTRAM) 50 MG tablet, Take 50  mg by mouth every 6 (six) hours as needed. For pain relief, Disp: , Rfl:   ALLERGIES:   Allergies  Allergen Reactions  . Codeine   . Epinephrine   . Sulfur   . Tetanus Toxoids     REVIEW OF SYSTEMS: REVIEWED IN DETAIL IN CHART  Review of Systems complete review of systems was done with pertinent positives listed in history of present illness  Cardiovascular: Negative for chest pain.  All other systems reviewed and are negative   FAMILY HISTORY:  History reviewed. No pertinent family history.  SOCIAL HISTORY:   History  Substance Use Topics  . Smoking status: Never Smoker   . Smokeless tobacco: Never Used  . Alcohol Use: No       EXAMINATION: Vital signs in last 24 hours: Temp:  [97.6 F (36.4 C)-98.5 F (36.9 C)] 98.5 F (36.9 C) 10-17-2022 1704) Pulse Rate:  [65-71] 71  10/17/2022 1704) Resp:  [23-26] 23  10/17/2022 1704) BP: (135-158)/(81-89) 135/81 mmHg 2022-10-17 1704) SpO2:  [96 %-98 %] 98 % 10/17/2022 1704)  General appearance: alert, cooperative, appears stated age and moderate distress Head: Normocephalic, without obvious abnormality Eyes: negative findings: pupils equal, round, reactive to light and accomodation Lungs: clear to auscultation bilaterally Heart: regular rate and rhythm Abdomen: soft, non-tender; bowel sounds normal; no masses,  no organomegaly Extremities: left LE externally rotated and shortened.  Pulse 1+ DP.  EHL, FHL, Ant/post tib intact    DIAGNOSTIC STUDIES: Recent laboratory studies:  Basename 10/17/2011 1720 10/17/2011 1305  WBC 11.4* 17.0*  HGB 10.8* 11.9*  HCT 31.1* 34.8*  PLT 152 178    Basename 17-Oct-2011 1305  NA 131*  K 4.0  CL 92*  CO2 26  BUN 20  CREATININE 0.73  GLUCOSE 171*  CALCIUM 9.5   Lab Results  Component Value Date   INR 1.08 17-Oct-2011     Recent Radiographic Studies :  Dg Hip Complete Left  10-17-11  *RADIOLOGY REPORT*  Clinical Data: Fall.  Left hip pain.  LEFT HIP - COMPLETE 2+ VIEW  Comparison: None.  Findings: Intertrochanteric left femur fracture with mild varus deformity is noted.  Mild displacement of the lesser trochanter. Hardware is present in the proximal right femur with subtrochanteric deformity.  No breakage or loosening of the hardware.  Osteopenia.  IMPRESSION: Acute intertrochanteric left femur fracture. Per CMS PQRS reporting requirements (PQRS Measure 24): Given the patient's age of greater than 50 and the fracture site (hip, distal radius, or spine), the patient should be tested for osteoporosis using DXA, and the appropriate treatment considered based on the DXA results.  Original Report Authenticated By: Donavan Burnet, M.D.   Dg Chest Port 1  View  10/17/2011  *RADIOLOGY REPORT*  Clinical Data: Preop  PORTABLE CHEST - 1 VIEW  Comparison: 12/12/2010  Findings: Cardiomediastinal silhouette is stable.  There is kyphosis and dextroscoliosis of thoracic spine.  Diffuse osteopenia is noted.  Chronic fracture deformity noted bilateral proximal humerus.  No acute infiltrate or pulmonary edema.  Stable chronic interstitial prominence.  IMPRESSION: No active disease.  Stable hyperinflation and chronic interstitial prominence.  There is kyphosis and dextroscoliosis of thoracic spine.  Chronic fracture deformity bilateral proximal humerus .  Original Report Authenticated By: Natasha Mead, M.D.    ASSESSMENT: Left intertrochanteric hip fracture   PLAN:  IM Hip screw left hip   Procedure, risks and benefits explained and understood.   Misty Shah 10-17-2011, 7:20 PM

## 2011-09-25 NOTE — H&P (Signed)
Misty Shah MRN: [id number] DOB/AGE: 04-Feb-1916 78 y.o. Primary Care Dot Been, MD, MD Admit date: 09/25/2011 Chief Complaint: Fall HPI: 78 year-old female presents with a mechanical fall. She denies any chest pain palpitations lightheadedness prior to the fall. She states her car was rolled over next 2 the fireplace, and she tried to bend over and fix it. She denies any previous cardiac history. She has had multiple falls in the past, and all of them have been mechanical falls . She ambulates in the house without a walker at baseline and is fairly independent. She denies any urinary urgency frequency or dysuria but is found to have a urinary tract infection during this admission.   Past Medical History  Diagnosis Date  . Hypertension   . Arthritis   . Hypothyroidism   . UTI (lower urinary tract infection)   . Anxiety   . Claustrophobia   . Dislocated shoulder     hx bilateral shoulders - had therapy on them    Past Surgical History  Procedure Date  . Cholecystectomy   . Thyroidectomy   . Tonsillectomy   . Appendectomy   . Hip surgery     right hip  . Colonoscopy   . Cataract extraction, bilateral     Prior to Admission medications   Medication Sig Start Date End Date Taking? Authorizing Provider  amLODipine (NORVASC) 5 MG tablet Take 5 mg by mouth daily.   Yes Historical Provider, MD  aspirin EC 81 MG tablet Take 81 mg by mouth daily.   Yes Historical Provider, MD  levothyroxine (SYNTHROID, LEVOTHROID) 100 MCG tablet Take 100 mcg by mouth daily.   Yes Historical Provider, MD  metoprolol (LOPRESSOR) 100 MG tablet Take 100 mg by mouth 2 (two) times daily.   Yes Historical Provider, MD  traMADol (ULTRAM) 50 MG tablet Take 50 mg by mouth every 6 (six) hours as needed. For pain relief   Yes Historical Provider, MD    Allergies:  Allergies  Allergen Reactions  . Codeine   . Epinephrine   . Sulfur   . Tetanus Toxoids     History reviewed. No pertinent  family history.  Social History:  reports that she has never smoked. She has never used smokeless tobacco. She reports that she does not drink alcohol or use illicit drugs.         NWG:NFAOZH of Systems complete review of systems was done with pertinent positives listed in history of present illness Cardiovascular: Negative for chest pain.  All other systems reviewed and are negative  PHYSICAL EXAM: Blood pressure 135/81, pulse 71, temperature 98.5 F (36.9 C), temperature source Oral, resp. rate 23, SpO2 98.00%. Nursing note and vitals reviewed.  Constitutional: She appears well-developed and well-nourished.  HENT:  Head: Normocephalic and atraumatic.  Eyes: Conjunctivae and EOM are normal. Pupils are equal, round, and reactive to light.  Neck: Normal range of motion. Neck supple.  Cardiovascular: Normal rate, regular rhythm, normal heart sounds and intact distal pulses.  Pulmonary/Chest: Effort normal and breath sounds normal.  Abdominal: Soft. Bowel sounds are normal.  Musculoskeletal: Normal range of motion.  The left thigh and tenderness over her left hip. Left dated and shortened. The dorsal pedalis pulses are present. Toes are pink with capillary refill less than 2 seconds and sensation is intact. There is no tenderness noted over the ankle lower leg knee or lower thigh.  Neurological: She is alert.  Skin: Skin is warm and dry.  Psychiatric: She has a  normal mood and affect. Thought content normal.     No results found for this or any previous visit (from the past 240 hour(s)).   Results for orders placed during the hospital encounter of 09/25/11 (from the past 48 hour(s))  URINALYSIS, ROUTINE W REFLEX MICROSCOPIC     Status: Abnormal   Collection Time   09/25/11  1:00 PM      Component Value Range Comment   Color, Urine YELLOW  YELLOW     APPearance CLOUDY (*) CLEAR     Specific Gravity, Urine 1.016  1.005 - 1.030     pH 6.5  5.0 - 8.0     Glucose, UA NEGATIVE   NEGATIVE (mg/dL)    Hgb urine dipstick SMALL (*) NEGATIVE     Bilirubin Urine NEGATIVE  NEGATIVE     Ketones, ur TRACE (*) NEGATIVE (mg/dL)    Protein, ur NEGATIVE  NEGATIVE (mg/dL)    Urobilinogen, UA 0.2  0.0 - 1.0 (mg/dL)    Nitrite NEGATIVE  NEGATIVE     Leukocytes, UA NEGATIVE  NEGATIVE    URINE MICROSCOPIC-ADD ON     Status: Abnormal   Collection Time   09/25/11  1:00 PM      Component Value Range Comment   WBC, UA 3-6  <3 (WBC/hpf)    RBC / HPF 0-2  <3 (RBC/hpf)    Bacteria, UA MANY (*) RARE    CBC     Status: Abnormal   Collection Time   09/25/11  1:05 PM      Component Value Range Comment   WBC 17.0 (*) 4.0 - 10.5 (K/uL)    RBC 3.93  3.87 - 5.11 (MIL/uL)    Hemoglobin 11.9 (*) 12.0 - 15.0 (g/dL)    HCT 40.9 (*) 81.1 - 46.0 (%)    MCV 88.5  78.0 - 100.0 (fL)    MCH 30.3  26.0 - 34.0 (pg)    MCHC 34.2  30.0 - 36.0 (g/dL)    RDW 91.4  78.2 - 95.6 (%)    Platelets 178  150 - 400 (K/uL)   DIFFERENTIAL     Status: Abnormal   Collection Time   09/25/11  1:05 PM      Component Value Range Comment   Neutrophils Relative 89 (*) 43 - 77 (%)    Neutro Abs 15.2 (*) 1.7 - 7.7 (K/uL)    Lymphocytes Relative 6 (*) 12 - 46 (%)    Lymphs Abs 1.0  0.7 - 4.0 (K/uL)    Monocytes Relative 5  3 - 12 (%)    Monocytes Absolute 0.8  0.1 - 1.0 (K/uL)    Eosinophils Relative 0  0 - 5 (%)    Eosinophils Absolute 0.0  0.0 - 0.7 (K/uL)    Basophils Relative 0  0 - 1 (%)    Basophils Absolute 0.0  0.0 - 0.1 (K/uL)   COMPREHENSIVE METABOLIC PANEL     Status: Abnormal   Collection Time   09/25/11  1:05 PM      Component Value Range Comment   Sodium 131 (*) 135 - 145 (mEq/L)    Potassium 4.0  3.5 - 5.1 (mEq/L)    Chloride 92 (*) 96 - 112 (mEq/L)    CO2 26  19 - 32 (mEq/L)    Glucose, Bld 171 (*) 70 - 99 (mg/dL)    BUN 20  6 - 23 (mg/dL)    Creatinine, Ser 2.13  0.50 - 1.10 (mg/dL)  Calcium 9.5  8.4 - 10.5 (mg/dL)    Total Protein 7.4  6.0 - 8.3 (g/dL)    Albumin 4.2  3.5 - 5.2 (g/dL)     AST 24  0 - 37 (U/L)    ALT 8  0 - 35 (U/L)    Alkaline Phosphatase 64  39 - 117 (U/L)    Total Bilirubin 0.6  0.3 - 1.2 (mg/dL)    GFR calc non Af Amer 71 (*) >90 (mL/min)    GFR calc Af Amer 83 (*) >90 (mL/min)   PROTIME-INR     Status: Normal   Collection Time   09/25/11  1:05 PM      Component Value Range Comment   Prothrombin Time 14.2  11.6 - 15.2 (seconds)    INR 1.08  0.00 - 1.49    APTT     Status: Abnormal   Collection Time   09/25/11  1:05 PM      Component Value Range Comment   aPTT 23 (*) 24 - 37 (seconds)   TYPE AND SCREEN     Status: Normal   Collection Time   09/25/11  1:05 PM      Component Value Range Comment   ABO/RH(D) B POS      Antibody Screen NEG      Sample Expiration 09/28/2011     ABO/RH     Status: Normal   Collection Time   09/25/11  1:15 PM      Component Value Range Comment   ABO/RH(D) B POS       Dg Hip Complete Left  09/25/2011  *RADIOLOGY REPORT*  Clinical Data: Fall.  Left hip pain.  LEFT HIP - COMPLETE 2+ VIEW  Comparison: None.  Findings: Intertrochanteric left femur fracture with mild varus deformity is noted.  Mild displacement of the lesser trochanter. Hardware is present in the proximal right femur with subtrochanteric deformity.  No breakage or loosening of the hardware.  Osteopenia.  IMPRESSION: Acute intertrochanteric left femur fracture. Per CMS PQRS reporting requirements (PQRS Measure 24): Given the patient's age of greater than 50 and the fracture site (hip, distal radius, or spine), the patient should be tested for osteoporosis using DXA, and the appropriate treatment considered based on the DXA results.  Original Report Authenticated By: Donavan Burnet, M.D.   Dg Chest Port 1 View  09/25/2011  *RADIOLOGY REPORT*  Clinical Data: Preop  PORTABLE CHEST - 1 VIEW  Comparison: 12/12/2010  Findings: Cardiomediastinal silhouette is stable.  There is kyphosis and dextroscoliosis of thoracic spine.  Diffuse osteopenia is noted.  Chronic fracture  deformity noted bilateral proximal humerus.  No acute infiltrate or pulmonary edema.  Stable chronic interstitial prominence.  IMPRESSION: No active disease.  Stable hyperinflation and chronic interstitial prominence.  There is kyphosis and dextroscoliosis of thoracic spine.  Chronic fracture deformity bilateral proximal humerus .  Original Report Authenticated By: Natasha Mead, M.D.    Impression: #1 left intertrochanteric fracture, Dr. Marisa Severin has been consulted by EDP, I have repaged him from the ER again. Given the patient's long-standing history of hypertension, the patient is probably at moderate risk in the perioperative setting entrant postoperative complications. We will try to obtain a 2-D echo today. The patient does not give me any history of any arrhythmias, CHF. Will start low-dose beta blocker in the perioperative setting #2 UTI start the patient on Rocephin #3 leukocytosis probably stress margination could also be secondary UTI #4 hypertension continue Norvasc and metoprolol She is  a full code          [facility] 09/25/2011, 5:19 PM

## 2011-09-25 NOTE — Progress Notes (Signed)
CM noted cm consult from admission RN for possible home health or inpatient rehab services.  Cm spoke with pt and son in ED RM #17.  Pt believed [facility] (OT/PT).  CM spoke with Bridgette at [phone] to confirm pt was previously active with [facility].  Pt informed and chose [facility] to assist if services needed upon d/c  Copy of list of home health agencies including [facility] given to son at bedside.  No questions about inpatient rehab services from pt nor son.  [facility] coordinator left a message informing of Admission.

## 2011-09-25 NOTE — Progress Notes (Signed)
pt confirms this is her pcp is [facility] office # is [phone] Pt states she has an appointment on 09/26/11 in which she wishes to re schedule CM contacted the office and spoke with omega, receptionist to cancel the appointment Pt to call office to re schedule appointment as needed per pt request

## 2011-09-25 NOTE — ED Provider Notes (Signed)
History     CSN: [id number]  Arrival date & time 09/25/11  1133   First MD Initiated Contact with Patient 09/25/11 1154      No chief complaint on file.   (Consider location/radiation/quality/duration/timing/severity/associated sxs/prior treatment) Patient is a 78 y.o. female presenting with hip pain. The history is provided by the patient.  Hip Pain This is a new problem. The current episode started 3 to 5 hours ago (S. morning at 07 100. She went on her left hip. She was unable to get up. She denies any other injury. She did not strike her head or lose consciousness. Her son came to check on her about 3 hours later. He called EMS and she was brought to the emergency d). The problem occurs constantly. Pertinent negatives include no chest pain.   states that she has injured her right hip previously and Dr. Priscille Kluver has operated on.  No past medical history on file.  No past surgical history on file.  No family history on file.  History  Substance Use Topics  . Smoking status: Not on file  . Smokeless tobacco: Not on file  . Alcohol Use: Not on file    OB History    No data available      Review of Systems  Cardiovascular: Negative for chest pain.  All other systems reviewed and are negative.    Allergies  Codeine; Epinephrine; Sulfur; and Tetanus toxoids  Home Medications   Current Outpatient Rx  Name Route Sig Dispense Refill  . AMLODIPINE BESYLATE 5 MG PO TABS Oral Take 5 mg by mouth daily.    . ASPIRIN EC 81 MG PO TBEC Oral Take 81 mg by mouth daily.    Marland Kitchen BENAZEPRIL-HYDROCHLOROTHIAZIDE 20-12.5 MG PO TABS Oral Take 1 tablet by mouth daily.    . CELECOXIB 200 MG PO CAPS Oral Take 200 mg by mouth daily.    Marland Kitchen DIAZEPAM 2 MG PO TABS Oral Take 2 mg by mouth every 8 (eight) hours as needed. for anxiety    . LEVOTHYROXINE SODIUM 100 MCG PO TABS Oral Take 100 mcg by mouth daily.    Marland Kitchen METOPROLOL TARTRATE 100 MG PO TABS Oral Take 100 mg by mouth 2 (two) times daily.    Marland Kitchen  NAPROXEN 375 MG PO TABS Oral Take 375 mg by mouth 2 (two) times daily with a meal.    . TRAMADOL HCL 50 MG PO TABS Oral Take 50 mg by mouth every 6 (six) hours as needed. For pain relief      BP 158/89  Pulse 65  Temp(Src) 97.6 F (36.4 C) (Oral)  Resp 26  SpO2 96%  Physical Exam  Nursing note and vitals reviewed. Constitutional: She appears well-developed and well-nourished.  HENT:  Head: Normocephalic and atraumatic.  Eyes: Conjunctivae and EOM are normal. Pupils are equal, round, and reactive to light.  Neck: Normal range of motion. Neck supple.  Cardiovascular: Normal rate, regular rhythm, normal heart sounds and intact distal pulses.   Pulmonary/Chest: Effort normal and breath sounds normal.  Abdominal: Soft. Bowel sounds are normal.  Musculoskeletal: Normal range of motion.       The left thigh and tenderness over her left hip. Left dated and shortened. The dorsal pedalis pulses are present. Toes are pink with capillary refill less than 2 seconds and sensation is intact. There is no tenderness noted over the ankle lower leg knee or lower thigh.  Neurological: She is alert.  Skin: Skin is warm and dry.  Psychiatric: She has a normal mood and affect. Thought content normal.    ED Course  Procedures (including critical care time)  Labs Reviewed  CBC - Abnormal; Notable for the following:    WBC 17.0 (*)    Hemoglobin 11.9 (*)    HCT 34.8 (*)    All other components within normal limits  DIFFERENTIAL - Abnormal; Notable for the following:    Neutrophils Relative 89 (*)    Neutro Abs 15.2 (*)    Lymphocytes Relative 6 (*)    All other components within normal limits  APTT - Abnormal; Notable for the following:    aPTT 23 (*)    All other components within normal limits  URINALYSIS, ROUTINE W REFLEX MICROSCOPIC - Abnormal; Notable for the following:    APPearance CLOUDY (*)    Hgb urine dipstick SMALL (*)    Ketones, ur TRACE (*)    All other components within normal  limits  URINE MICROSCOPIC-ADD ON - Abnormal; Notable for the following:    Bacteria, UA MANY (*)    All other components within normal limits  PROTIME-INR  TYPE AND SCREEN  COMPREHENSIVE METABOLIC PANEL  URINE CULTURE  ABO/RH   Dg Hip Complete Left  09/25/2011  *RADIOLOGY REPORT*  Clinical Data: Fall.  Left hip pain.  LEFT HIP - COMPLETE 2+ VIEW  Comparison: None.  Findings: Intertrochanteric left femur fracture with mild varus deformity is noted.  Mild displacement of the lesser trochanter. Hardware is present in the proximal right femur with subtrochanteric deformity.  No breakage or loosening of the hardware.  Osteopenia.  IMPRESSION: Acute intertrochanteric left femur fracture. Per CMS PQRS reporting requirements (PQRS Measure 24): Given the patient's age of greater than 50 and the fracture site (hip, distal radius, or spine), the patient should be tested for osteoporosis using DXA, and the appropriate treatment considered based on the DXA results.  Original Report Authenticated By: Donavan Burnet, M.D.   Dg Chest Port 1 View  09/25/2011  *RADIOLOGY REPORT*  Clinical Data: Preop  PORTABLE CHEST - 1 VIEW  Comparison: 12/12/2010  Findings: Cardiomediastinal silhouette is stable.  There is kyphosis and dextroscoliosis of thoracic spine.  Diffuse osteopenia is noted.  Chronic fracture deformity noted bilateral proximal humerus.  No acute infiltrate or pulmonary edema.  Stable chronic interstitial prominence.  IMPRESSION: No active disease.  Stable hyperinflation and chronic interstitial prominence.  There is kyphosis and dextroscoliosis of thoracic spine.  Chronic fracture deformity bilateral proximal humerus .  Original Report Authenticated By: Natasha Mead, M.D.     No diagnosis found.  Dg Hip Complete Left  09/25/2011  *RADIOLOGY REPORT*  Clinical Data: Fall.  Left hip pain.  LEFT HIP - COMPLETE 2+ VIEW  Comparison: None.  Findings: Intertrochanteric left femur fracture with mild varus deformity  is noted.  Mild displacement of the lesser trochanter. Hardware is present in the proximal right femur with subtrochanteric deformity.  No breakage or loosening of the hardware.  Osteopenia.  IMPRESSION: Acute intertrochanteric left femur fracture. Per CMS PQRS reporting requirements (PQRS Measure 24): Given the patient's age of greater than 50 and the fracture site (hip, distal radius, or spine), the patient should be tested for osteoporosis using DXA, and the appropriate treatment considered based on the DXA results.  Original Report Authenticated By: Donavan Burnet, M.D.   D  Results for orders placed during the hospital encounter of 09/25/11  CBC      Component Value Range   WBC 17.0 (*)  4.0 - 10.5 (K/uL)   RBC 3.93  3.87 - 5.11 (MIL/uL)   Hemoglobin 11.9 (*) 12.0 - 15.0 (g/dL)   HCT 40.9 (*) 81.1 - 46.0 (%)   MCV 88.5  78.0 - 100.0 (fL)   MCH 30.3  26.0 - 34.0 (pg)   MCHC 34.2  30.0 - 36.0 (g/dL)   RDW 91.4  78.2 - 95.6 (%)   Platelets 178  150 - 400 (K/uL)  DIFFERENTIAL      Component Value Range   Neutrophils Relative 89 (*) 43 - 77 (%)   Neutro Abs 15.2 (*) 1.7 - 7.7 (K/uL)   Lymphocytes Relative 6 (*) 12 - 46 (%)   Lymphs Abs 1.0  0.7 - 4.0 (K/uL)   Monocytes Relative 5  3 - 12 (%)   Monocytes Absolute 0.8  0.1 - 1.0 (K/uL)   Eosinophils Relative 0  0 - 5 (%)   Eosinophils Absolute 0.0  0.0 - 0.7 (K/uL)   Basophils Relative 0  0 - 1 (%)   Basophils Absolute 0.0  0.0 - 0.1 (K/uL)  COMPREHENSIVE METABOLIC PANEL      Component Value Range   Sodium 131 (*) 135 - 145 (mEq/L)   Potassium 4.0  3.5 - 5.1 (mEq/L)   Chloride 92 (*) 96 - 112 (mEq/L)   CO2 26  19 - 32 (mEq/L)   Glucose, Bld 171 (*) 70 - 99 (mg/dL)   BUN 20  6 - 23 (mg/dL)   Creatinine, Ser 2.13  0.50 - 1.10 (mg/dL)   Calcium 9.5  8.4 - 08.6 (mg/dL)   Total Protein 7.4  6.0 - 8.3 (g/dL)   Albumin 4.2  3.5 - 5.2 (g/dL)   AST 24  0 - 37 (U/L)   ALT 8  0 - 35 (U/L)   Alkaline Phosphatase 64  39 - 117 (U/L)   Total  Bilirubin 0.6  0.3 - 1.2 (mg/dL)   GFR calc non Af Amer 71 (*) >90 (mL/min)   GFR calc Af Amer 83 (*) >90 (mL/min)  PROTIME-INR      Component Value Range   Prothrombin Time 14.2  11.6 - 15.2 (seconds)   INR 1.08  0.00 - 1.49   APTT      Component Value Range   aPTT 23 (*) 24 - 37 (seconds)  TYPE AND SCREEN      Component Value Range   ABO/RH(D) B POS     Antibody Screen PENDING     Sample Expiration 09/28/2011    URINALYSIS, ROUTINE W REFLEX MICROSCOPIC      Component Value Range   Color, Urine YELLOW  YELLOW    APPearance CLOUDY (*) CLEAR    Specific Gravity, Urine 1.016  1.005 - 1.030    pH 6.5  5.0 - 8.0    Glucose, UA NEGATIVE  NEGATIVE (mg/dL)   Hgb urine dipstick SMALL (*) NEGATIVE    Bilirubin Urine NEGATIVE  NEGATIVE    Ketones, ur TRACE (*) NEGATIVE (mg/dL)   Protein, ur NEGATIVE  NEGATIVE (mg/dL)   Urobilinogen, UA 0.2  0.0 - 1.0 (mg/dL)   Nitrite NEGATIVE  NEGATIVE    Leukocytes, UA NEGATIVE  NEGATIVE   URINE MICROSCOPIC-ADD ON      Component Value Range   WBC, UA 3-6  <3 (WBC/hpf)   RBC / HPF 0-2  <3 (RBC/hpf)   Bacteria, UA MANY (*) RARE    Date: 09/25/2011  Rate: 67  Rhythm: normal sinus rhythm  QRS Axis: normal  Intervals:borderline prolonged qt at 490 ms  ST/T Wave abnormalities: nonspecific ST changes  Conduction Disutrbances:none  Narrative Interpretation:   Old EKG Reviewed: had normal qtat    Dr. Priscille Kluver paged.   Patient care discussed with hospitalist and patient admitted for       Hilario Quarry, MD 09/28/11 1556

## 2011-09-25 NOTE — ED Notes (Signed)
Pt states at home trying to straight out a rug in front of the fire place when she lost balance and fell from standing position onto left side also hit back of head on the brick walling of fire place.

## 2011-09-25 NOTE — Brief Op Note (Signed)
09/25/2011  9:10 PM  PATIENT:  Misty Shah  78 y.o. female  PRE-OPERATIVE DIAGNOSIS:  Displaced intertrochanteric fracture left hip  POST-OPERATIVE DIAGNOSIS:  same PROCEDURE:  Procedure(s) (LRB):CLOSED REDUCTION,INTRAMEDULLARY (IM) NAIL FEMORAL (Left)  SURGEON:  Surgeon(s) and Role:    * Valeria Batman, MD - Primary  PHYSICIAN ASSISTANT: Jacqualine Code, PA-C ASSISTANTS: none   ANESTHESIA:   general  EBL:  Total I/O In: -  Out: 150 [Blood:150]  BLOOD ADMINISTERED:none  DRAINS: none   LOCAL MEDICATIONS USED:  NONE  SPECIMEN:  No Specimen  DISPOSITION OF SPECIMEN:  N/A  COUNTS:  YES  TOURNIQUET:  * No tourniquets in log *  DICTATION: .Other Dictation: Dictation Number [id number]  PLAN OF CARE: Admit to inpatient   PATIENT DISPOSITION:  PACU - hemodynamically stable.   Delay start of Pharmacological VTE agent (>24hrs) due to surgical blood loss or risk of bleeding: not applicable PATIENT ID:      Misty Shah  MRN:     [id number] DOB/AGE:    09-Jun-1915 / 78 y.o.       OPERATIVE REPORT    DATE OF PROCEDURE:  09/25/2011       PREOPERATIVE DIAGNOSIS:   fractured lef hip                                                       There is no height or weight on file to calculate BMI.     POSTOPERATIVE DIAGNOSIS:   left hip fracture                                                                     There is no height or weight on file to calculate BMI.     PROCEDURE:  Procedure(s): INTRAMEDULLARY (IM) NAIL FEMORAL     SURGEON: Adison Reifsteck Shah    ASSISTANT:   Jacqualine Code, PA-C   (Present and scrubbed throughout the case, critical for assistance with exposure, retraction, instrumentation, and closure.)          ANESTHESIA: General      DRAINS: none :      TOURNIQUET TIME: * No tourniquets in log *    COMPLICATIONS:  None   CONDITION:  stable  PROCEDURE IN DETAIL: [id number]   Misty Shah 09/25/2011, 9:10 PM

## 2011-09-25 NOTE — ED Notes (Signed)
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

## 2011-09-26 LAB — CARDIAC PANEL(CRET KIN+CKTOT+MB+TROPI)
CK, MB: 7.8 ng/mL (ref 0.3–4.0)
Troponin I: <0.3 ng/mL (ref <0.30)
Troponin I: <0.3 ng/mL (ref <0.30)

## 2011-09-26 LAB — COMPREHENSIVE METABOLIC PANEL
ALT: 8 U/L (ref 0–35)
AST: 24 U/L (ref 0–37)
Albumin: 2.7 g/dL — ABNORMAL LOW (ref 3.5–5.2)
CO2: 24 mEq/L (ref 19–32)
Calcium: 7.8 mg/dL — ABNORMAL LOW (ref 8.4–10.5)
Chloride: 98 mEq/L (ref 96–112)
GFR calc non Af Amer: 72 mL/min — ABNORMAL LOW (ref >90)
Sodium: 133 mEq/L — ABNORMAL LOW (ref 135–145)

## 2011-09-26 LAB — CBC
MCH: 30.2 pg (ref 26.0–34.0)
Platelets: 125 10*3/uL — ABNORMAL LOW (ref 150–400)
RBC: 2.55 MIL/uL — ABNORMAL LOW (ref 3.87–5.11)
WBC: 10.9 10*3/uL — ABNORMAL HIGH (ref 4.0–10.5)

## 2011-09-26 MED ORDER — POTASSIUM CHLORIDE 20 MEQ/15ML (10%) PO LIQD
20.0000 meq | Freq: Once | ORAL | Status: AC
  Administered 2011-09-26: 20 meq via ORAL
  Filled 2011-09-26: qty 15

## 2011-09-26 MED ORDER — FUROSEMIDE 10 MG/ML IJ SOLN
20.0000 mg | Freq: Once | INTRAMUSCULAR | Status: AC
  Administered 2011-09-26: 20 mg via INTRAVENOUS
  Filled 2011-09-26: qty 2

## 2011-09-26 MED ORDER — LEVALBUTEROL HCL 0.63 MG/3ML IN NEBU: 0.6300 mg | INHALATION_SOLUTION | RESPIRATORY_TRACT | Status: DC | PRN

## 2011-09-26 MED ORDER — ALUM & MAG HYDROXIDE-SIMETH 200-200-20 MG/5ML PO SUSP
30.0000 mL | ORAL | Status: DC | PRN
  Administered 2011-09-26: 30 mL via ORAL
  Filled 2011-09-26: qty 30

## 2011-09-26 NOTE — Evaluation (Addendum)
Physical Therapy Evaluation Patient Details Name: Misty Shah MRN: [id number] DOB: Jun 09, 1915 Today's Date: 09/26/2011 Time: 1125-1252 PT Time Calculation (min): 87 min  PT Assessment / Plan / Recommendation Clinical Impression  78 y.o. female who fell at home breaking her left femur.  She is now POD #1 s/p L leg IM Nail.  She presents today with low Hgb and low BP which is greatly affecting her performance on her first day of therapy.  She has decreased L leg ROM, decreased left and right leg strength, decreased activity tolerance, decreased mobility, balance and decreased ability to walk.  She would benefit from acute PT to maximize her independence, functional mobility and safety so that after extensive rehabilitation she may be able to return home to modified independent living environment safely.  I spoke at length with her son on the phone who would like to persue inpatient rehabilitation first and see if his mom qualifies for this level of therapy and then if not persue SNF for rehab.  I anticipate that after she recieves some blood today that her performance with therapy will be much improved tomorrow.      PT Assessment  Patient needs continued PT services    Follow Up Recommendations  Inpatient Rehab;Skilled nursing facility (depends on progress )    Equipment Recommendations  Defer to next venue    Frequency Min 5X/week    Precautions / Restrictions Precautions Precautions: Fall Restrictions LLE Weight Bearing: Weight bearing as tolerated   Pertinent Vitals/Pain Pain 5/10 at rest, BP in recliner 80s/30s, BP when supine back in bed 95/33, placed patient in trendelenburg and notified RN.  HR and O2 stable throughout.        Mobility  Bed Mobility Bed Mobility: Supine to Sit;Sit to Supine;Scooting to [facility];Sitting - Scoot to Edge of Bed Supine to Sit: HOB elevated;1: +2 Total assist Supine to Sit: Patient Percentage: 50% Sitting - Scoot to Edge of Bed: 2: Max assist Sit to  Supine: 1: +2 Total assist;HOB flat Sit to Supine: Patient Percentage: 30% Scooting to HOB: 1: +2 Total assist Scooting to [facility]: Patient Percentage:  (0%) Details for Bed Mobility Assistance: The patient did well getting to the side of the bed following commands to perform 1/2 bridge technique and initiating movement of both legs and her trunk to pull up on one therapist with her arms to get to sitting.  She helped less getting back into bed, but this was due to increased lethargy due to extremely low BP in sitting.  PT and technician wanted to get her back supine quickly to help with BP and arousal.    Transfers Transfers: Sit to Stand;Stand to Sit;Stand Pivot Transfers Sit to Stand: 1: +2 Total assist;With upper extremity assist;From bed;From elevated surface;From chair/3-in-1 Sit to Stand: Patient Percentage: 50% Stand to Sit: 1: +2 Total assist;To chair/3-in-1;To bed;With upper extremity assist Stand to Sit: Patient Percentage: 50% Stand Pivot Transfers: 1: +2 Total assist Stand Pivot Transfers: Patient Percentage: 60% Details for Transfer Assistance: the patient needed assist at her trunk to perform transfer, but once standing took good weight on her feet and was able to take some small shuffle steps around to the chair.  The patient became nauseated, hot and lightheaded during transfer, so we setteled her in the chair with her feet elevated and checked her BP.  It was low 80s/30s and would not increase even after 10 -15 mins with feet elevated in the chair, so we transferred her back to bed  and placed her in trendelenberg.  RN informed and repeat BPs were taken.  Max BP 95/33.    Ambulation/Gait Ambulation/Gait Assistance:  (not attempted today due to BP and low blood levels)    Exercises General Exercises - Lower Extremity Ankle Circles/Pumps: AROM;Both;10 reps Quad Sets: AROM;Left;5 reps Heel Slides: AAROM;Left;5 reps   PT Goals Acute Rehab PT Goals PT Goal Formulation: With  patient/family Time For Goal Achievement: 10/10/11 Potential to Achieve Goals: Good Pt will go Supine/Side to Sit: with min assist;with rail PT Goal: Supine/Side to Sit - Progress: Goal set today Pt will Sit at Edge of Bed: with modified independence;3-5 min;with bilateral upper extremity support PT Goal: Sit at Edge Of Bed - Progress: Goal set today Pt will go Sit to Supine/Side: with min assist;with rail PT Goal: Sit to Supine/Side - Progress: Goal set today Pt will go Sit to Stand: with min assist;from elevated surface;with upper extremity assist PT Goal: Sit to Stand - Progress: Goal set today Pt will go Stand to Sit: with min assist;with upper extremity assist PT Goal: Stand to Sit - Progress: Goal set today Pt will Transfer Bed to Chair/Chair to Bed: with min assist PT Transfer Goal: Bed to Chair/Chair to Bed - Progress: Goal set today Pt will Ambulate: 16 - 50 feet;with mod assist;with rolling walker PT Goal: Ambulate - Progress: Goal set today Pt will Go Up / Down Stairs: 1-2 stairs;with mod assist;with least restrictive assistive device PT Goal: Up/Down Stairs - Progress: Goal set today  Visit Information  Last PT Received On: 09/26/11 Assistance Needed: +2    Subjective Data  Subjective: The patient and family report that she was independent, only needing assist in and out of the house (stairs) and with buying groceries due to the fact that she doesn't drive, but she does light chores around the home and walks without an assitive device.   Patient Stated Goal: to return home to independent living.     Prior Functioning  Home Living Lives With: Alone Available Help at Discharge: Family;Available PRN/intermittently (not sure that 24 hour assist can be arranged.  ) Type of Home: House Home Access: Stairs to enter [employer] of Steps: 1 Entrance Stairs-Rails:  (has something to hold to there) Home Layout: Two level;Full bath on main level;Able to live on main  level with bedroom/bathroom (doesn't go upstairs.  ) Bathroom Shower/Tub: Tub/shower unit;Curtain (at times takes a sponge bath) Bathroom Toilet: Standard (with 3-in-1) Bathroom Accessibility: Yes How Accessible: Accessible via walker (sideways) Home Adaptive Equipment: Grab bars in shower;Hand-held shower hose;Shower chair with back;Walker - rolling;Straight cane;Bedside commode/3-in-1 Prior Function Level of Independence: Needs assistance Needs Assistance: Light Housekeeping (yardwork and housework, son gets groceries.  ) Artist:  (needs assist with heavy housework like vacuuming and moping.) Able to Take Stairs?: No (not without assistance from family) Driving: No (has never driven) Communication Communication: HOH (reports left ear is her best ear)    Cognition  Overall Cognitive Status: Appears within functional limits for tasks assessed/performed Arousal/Alertness: Lethargic Orientation Level: Appears intact for tasks assessed Behavior During Session: Lethargic Cognition - Other Comments: cognition not formally assessed, but history and conversation flow was normal and confirmed by family.      Extremity/Trunk Assessment Right Lower Extremity Assessment RLE ROM/Strength/Tone: Deficits RLE ROM/Strength/Tone Deficits: grossly 4/5 per functional assessment.   Left Lower Extremity Assessment LLE ROM/Strength/Tone: Deficits LLE ROM/Strength/Tone Deficits: decreased left hip and knee ROM due to post-op status (~85 degrees at both hip and  knee seated EOB, ankle WNL ROM, strength 3-/5 ankle, 3-/5 knee, 2+/5 hip   Balance Balance Balance Assessed: Yes Static Sitting Balance Static Sitting - Balance Support: Right upper extremity supported;Left upper extremity supported;Feet supported Static Sitting - Level of Assistance: 4: Min assist;5: Stand by assistance Static Sitting - Comment/# of Minutes: 8 mins.  Upon first sitting min assist to prevent LOB posteriorly and patient  was leaning right off of painful left leg.  Once positioned a little better with hips squared up to the EOB the patient was able to get to supervision level for ~5 mins.   End of Session PT - End of Session Equipment Utilized During Treatment: Gait belt Activity Tolerance: Patient limited by fatigue;Patient limited by pain Patient left: in bed;with call bell/phone within reach Nurse Communication:  ( low BPs and d/c recs)   Lurena Joiner B. Prince Olivier, PT, DPT [phone] 09/26/2011, 1:49 PM

## 2011-09-26 NOTE — Op Note (Signed)
[name], Shah                  ACCOUNT NO.:  [id number]  MEDICAL RECORD NO.:  [id number]  LOCATION:  [facility]                         FACILITY:  [facility]  PHYSICIAN:  Claude Manges. Lavinia Mcneely, M.D.DATE OF BIRTH:  1916-01-12  DATE OF PROCEDURE:  09/25/2011 DATE OF DISCHARGE:                              OPERATIVE REPORT   PREOPERATIVE DIAGNOSIS:  Displaced intertrochanteric fracture, left hip.  POSTOPERATIVE DIAGNOSIS:  Displaced intertrochanteric fracture, left hip.  PROCEDURE:  Closed reduction and internal fixation with intramedullary nail, left femur.  SURGEON:  Claude Manges. Cleophas Dunker, M.D.  ASSISTANT:  Arlys John D. Petrarca, P.A.-C.  ANESTHESIA:  General.  COMPLICATIONS:  None.  PROCEDURE IN DETAIL:  [name] was identified in the holding area.  I marked her left leg as the appropriate operative site.  She was then transported to room #11 and placed under general anesthesia on the bed. She was then carefully transported to the fracture table.  The right lower extremity was padded and placed in 90:90.  The left lower extremity was placed in distal traction, well-padded and a reduction maneuver was performed.  Image intensification revealed anatomic position of the fracture.  Left hip was then prepped with DuraPrep from above the iliac crest to below the knee.  Sterile draping was performed.  We elected to use the Affixus DePuy intramedullary nail.  I made about an inch and a half incision along the mid lateral line above the greater trochanter as noted by image intensification.  Via sharp dissection, incision was carried down to the subcutaneous tissue.  Small bleeders were Bovie coagulated.  Deep fascia was then incised.  The tip of the greater trochanter was identified.  Using the fracture  awl, we determined the correct position with image intensification and inserted a guide pin.  We re-positioned it on 2 occasions checking with both AP and lateral projection and then we  drilled the guide pin and placed and again checked it to be sure it was anatomic.  Reaming was performed proximally to 16.5-mm to accept the nail measuring 15.5 at its tip.  The ball-tipped guide pin was then inserted through the reamed hole and placed into the metaphyseal region of the distal femur and checked position in both AP and lateral projections.  We elected to use a 9-mm nail and reamed to a 11.  It was minimal chatter at 10.5 and then an 11.  We checked position and felt we were perfectly intact.  The guide pin never left the femoral canal.  The Affixus 9-mm wide nail by 344-mm length, 130-degree angle intramedullary nail was then placed over the guide pin and inserted without difficulty. We positioned the lag screw holes such that we would enter either of the center of the femoral neck or just inferior to it.  With the external guide in place, a small incision was made along the lateral femur and then the guide assembly was then inserted.  A guide pin was placed through the lateral femoral cortex through the center of the hole into the center of the femoral neck and was noted to be in excellent position in both AP and lateral projections.  Reaming was then performed  to insert the 10.5 mm x 95-mm hip fracture nail lag screw.  This fit very nicely up along the lateral femoral cortex.  Maintaining position, we then performed a compression maneuver and then fixed the nail with the proximal locking device.  The distal nail was also affixed to the transverse screw measuring 38 mm x 5 mm after appropriate drilling and measuring, checking our position with AP and lateral projection. Further films were obtained.  We thought we had anatomic position with a perfectly stable construct.  The wounds were then irrigated with saline solution.  We closed the middle incision with 3-0 Monocryl and skin clips, distal small puncture site for the screw with a single skin clip and then the more  proximal incision in several layers with #1 2-0 Vicryl and then Monocryl with skin clips.  Sterile bulky dressing was applied. All the traction devices were removed.  The patient was then carefully transported to the operating room bed and sent to recovery room without difficulty.  There were no problems intraoperatively with anesthesia or during the surgery.  Patient tolerated the procedure without complications.     Claude Manges. Cleophas Dunker, M.D.     PWW/MEDQ  D:  09/25/2011  T:  09/26/2011  Job:  [id number]

## 2011-09-26 NOTE — Progress Notes (Signed)
Patient ID: Misty Shah, female   DOB: Dec 12, 1915, 78 y.o.   MRN: [id number] PATIENT ID: Misty Shah        MRN:  [id number]          DOB/AGE: 1915-08-21 / 78 y.o.  Norlene Campbell, MD   Jacqualine Code, PA-C [name] [place], [place], [place]  [address]                             [phone]   PROGRESS NOTE  Subjective:  negative for Chest Pain  negative for Shortness of Breath  negative for Nausea/Vomiting   negative for Calf Pain  negative for Bowel Movement   Tolerating Diet: yes         Patient reports pain as mild.    Objective: Vital signs in last 24 hours:   Patient Vitals for the past 24 hrs:  BP Temp Temp src Pulse Resp SpO2 Height Weight  09/26/11 1610 102/48 mmHg 98.5 F (36.9 C) Oral 69  19  - - -  09/26/11 1545 123/43 mmHg 97.7 F (36.5 C) Oral 58  20  - - -  09/26/11 1408 102/70 mmHg 98.1 F (36.7 C) Oral 68  20  100 % - -  09/26/11 1201 95/33 mmHg - - - - - - -  09/26/11 1200 - - - - 20  100 % - -  09/26/11 1140 81/33 mmHg - - - - - - -  09/26/11 1016 110/40 mmHg 98.5 F (36.9 C) Oral 67  22  100 % - -  09/26/11 0755 - - - - 18  100 % - -  09/26/11 0553 97/44 mmHg 97.9 F (36.6 C) Oral 62  17  100 % - -  09/26/11 0229 126/55 mmHg 97.4 F (36.3 C) Oral 72  16  99 % - -  09/26/11 0217 - - - - - 99 % - -  09/25/11 2309 - - - - - - 5\' 1"  (1.549 m) 54.432 kg (120 lb)  09/25/11 2300 - - - 77  - - - -  09/25/11 2257 126/55 mmHg 97.4 F (36.3 C) - 101  - - - -  09/25/11 2245 - - - 72  18  100 % - -  09/25/11 2230 136/53 mmHg 97.6 F (36.4 C) - 66  17  100 % - -  09/25/11 2215 145/70 mmHg - - 64  19  97 % - -  09/25/11 2200 148/58 mmHg 97.6 F (36.4 C) - 74  18  100 % - -  09/25/11 2145 149/69 mmHg - - 77  18  100 % - -  09/25/11 2130 132/52 mmHg 97 F (36.1 C) - 74  18  100 % - -  09/25/11 1704 135/81 mmHg 98.5 F (36.9 C) Oral 71  23  98 % - -      Intake/Output from previous day:   04/29 0701 - 04/30 0700 In: 1847.5 [I.V.:1747.5] Out: 750  [Urine:600]   Intake/Output this shift:   04/30 0701 - 04/30 1900 In: 340 [P.O.:240; I.V.:100] Out: -    Intake/Output      04/29 0701 - 04/30 0700 04/30 0701 - 05/01 0700   P.O.  240   I.V. (mL/kg) 1747.5 (32.1) 100 (1.8)   IV Piggyback 100    Total Intake(mL/kg) 1847.5 (33.9) 340 (6.2)   Urine (mL/kg/hr) 600 (0.5)    Blood 150  Total Output 750    Net +1097.5 +340           LABORATORY DATA:  Basename 09/26/11 0515 09/25/11 2242 09/25/11 1720 09/25/11 1305  WBC 10.9* 12.0* 11.4* 17.0*  HGB 7.7* 9.4* 10.8* 11.9*  HCT 22.9* 27.4* 31.1* 34.8*  PLT 125* 142* 152 178    Basename 09/26/11 0515 09/25/11 1305  NA 133* 131*  K 4.0 4.0  CL 98 92*  CO2 24 26  BUN 17 20  CREATININE 0.72 0.73  GLUCOSE 118* 171*  CALCIUM 7.8* 9.5   Lab Results  Component Value Date   INR 1.08 09/25/2011    Examination:  General appearance: alert, cooperative and no distress  Wound Exam: clean, dry, intact   Drainage:  None: wound tissue dry  Motor Exam: EHL, FHL, Anterior Tibial and Posterior Tibial Intact  Sensory Exam: Superficial Peroneal, Deep Peroneal and Tibial normal  Vascular Exam: Normal  Assessment:    1 Day Post-Op  Procedure(s) (LRB): INTRAMEDULLARY (IM) NAIL FEMORAL (Left)  ADDITIONAL DIAGNOSIS:  Principal Problem:  *Hip fracture Active Problems:  Hypertension  Osteoporosis  Hypothyroidism  UTI (lower urinary tract infection)  Acute Blood Loss Anemia-receiving transfusion  Plan: Physical Therapy as ordered Weight Bearing as Tolerated (WBAT)  DVT Prophylaxis:  Lovenox  DISCHARGE PLAN: Skilled Nursing Facility/Rehab  DISCHARGE NEEDS: Walker and 3-in-1 comode seat   comfortable this pm,transfusion hanging,OOB with PT today-good effort   Verne Cove W 09/26/2011, 5:02 PM

## 2011-09-26 NOTE — Progress Notes (Signed)
Nutrition Brief Note  - Pt screened for trouble swallowing pills. Pt reports eating well PTA with stable weight and denies any difficulty chewing or swallowing foods/liquids. Pt cooks for herself at home and family report she is a good cook. Pt currently working on lunch. No nutrition diagnosis or nutritional needs. Will monitor.   Dietitian # [phone]

## 2011-09-26 NOTE — Progress Notes (Addendum)
Tele order unclear if needed to be continued after surgery.  A 2.05 second pause was noted on the monitor. Ortho and MD on call agree to keep pt on telemetry at this time. Will continue to monitor pt who is currently asymptomatic and resting at this time.   0230 MD also notified of recent 2.15 sec pause and 2.10 sec pause.  K+ replacement ordered.

## 2011-09-26 NOTE — Progress Notes (Signed)
Foley DC per MD order.  Patient tolerated procedure well. Patient instructed to call for assist when she needs to use the restroom.  Patient verbalized understanding.

## 2011-09-26 NOTE — Progress Notes (Addendum)
Subjective: Hemodynamically stable post surgery  Objective: Vital signs in last 24 hours: Filed Vitals:   09/26/11 0229 09/26/11 0553 09/26/11 0755 09/26/11 1016  BP: 126/55 97/44  110/40  Pulse: 72 62  67  Temp: 97.4 F (36.3 C) 97.9 F (36.6 C)  98.5 F (36.9 C)  TempSrc: Oral Oral  Oral  Resp: 16 17 18 22   Height:      Weight:      SpO2: 99% 100% 100% 100%    Intake/Output Summary (Last 24 hours) at 09/26/11 1206 Last data filed at 09/26/11 0801  Gross per 24 hour  Intake 2087.5 ml  Output    750 ml  Net 1337.5 ml    Weight change:   Nursing note and vitals reviewed.  Constitutional: She appears well-developed and well-nourished.  HENT:  Head: Normocephalic and atraumatic.  Eyes: Conjunctivae and EOM are normal. Pupils are equal, round, and reactive to light.  Neck: Normal range of motion. Neck supple.  Cardiovascular: Normal rate, regular rhythm, normal heart sounds and intact distal pulses.  Pulmonary/Chest: Effort normal and breath sounds normal.  Abdominal: Soft. Bowel sounds are normal.  Musculoskeletal: Normal range of motion.  The left thigh and tenderness over her left hip. Left dated and shortened. The dorsal pedalis pulses are present. Toes are pink with capillary refill less than 2 seconds and sensation is intact. There is no tenderness noted over the ankle lower leg knee or lower thigh.  Neurological: She is alert.  Skin: Skin is warm and dry.  Psychiatric: She has a normal mood and affect. Thought content normal.    Lab Results: Results for orders placed during the hospital encounter of 09/25/11 (from the past 24 hour(s))  URINALYSIS, ROUTINE W REFLEX MICROSCOPIC     Status: Abnormal   Collection Time   09/25/11  1:00 PM      Component Value Range   Color, Urine YELLOW  YELLOW    APPearance CLOUDY (*) CLEAR    Specific Gravity, Urine 1.016  1.005 - 1.030    pH 6.5  5.0 - 8.0    Glucose, UA NEGATIVE  NEGATIVE (mg/dL)   Hgb urine dipstick SMALL  (*) NEGATIVE    Bilirubin Urine NEGATIVE  NEGATIVE    Ketones, ur TRACE (*) NEGATIVE (mg/dL)   Protein, ur NEGATIVE  NEGATIVE (mg/dL)   Urobilinogen, UA 0.2  0.0 - 1.0 (mg/dL)   Nitrite NEGATIVE  NEGATIVE    Leukocytes, UA NEGATIVE  NEGATIVE   URINE MICROSCOPIC-ADD ON     Status: Abnormal   Collection Time   09/25/11  1:00 PM      Component Value Range   WBC, UA 3-6  <3 (WBC/hpf)   RBC / HPF 0-2  <3 (RBC/hpf)   Bacteria, UA MANY (*) RARE   CBC     Status: Abnormal   Collection Time   09/25/11  1:05 PM      Component Value Range   WBC 17.0 (*) 4.0 - 10.5 (K/uL)   RBC 3.93  3.87 - 5.11 (MIL/uL)   Hemoglobin 11.9 (*) 12.0 - 15.0 (g/dL)   HCT 09.8 (*) 11.9 - 46.0 (%)   MCV 88.5  78.0 - 100.0 (fL)   MCH 30.3  26.0 - 34.0 (pg)   MCHC 34.2  30.0 - 36.0 (g/dL)   RDW 14.7  82.9 - 56.2 (%)   Platelets 178  150 - 400 (K/uL)  DIFFERENTIAL     Status: Abnormal   Collection Time  09/25/11  1:05 PM      Component Value Range   Neutrophils Relative 89 (*) 43 - 77 (%)   Neutro Abs 15.2 (*) 1.7 - 7.7 (K/uL)   Lymphocytes Relative 6 (*) 12 - 46 (%)   Lymphs Abs 1.0  0.7 - 4.0 (K/uL)   Monocytes Relative 5  3 - 12 (%)   Monocytes Absolute 0.8  0.1 - 1.0 (K/uL)   Eosinophils Relative 0  0 - 5 (%)   Eosinophils Absolute 0.0  0.0 - 0.7 (K/uL)   Basophils Relative 0  0 - 1 (%)   Basophils Absolute 0.0  0.0 - 0.1 (K/uL)  COMPREHENSIVE METABOLIC PANEL     Status: Abnormal   Collection Time   09/25/11  1:05 PM      Component Value Range   Sodium 131 (*) 135 - 145 (mEq/L)   Potassium 4.0  3.5 - 5.1 (mEq/L)   Chloride 92 (*) 96 - 112 (mEq/L)   CO2 26  19 - 32 (mEq/L)   Glucose, Bld 171 (*) 70 - 99 (mg/dL)   BUN 20  6 - 23 (mg/dL)   Creatinine, Ser 4.54  0.50 - 1.10 (mg/dL)   Calcium 9.5  8.4 - 09.8 (mg/dL)   Total Protein 7.4  6.0 - 8.3 (g/dL)   Albumin 4.2  3.5 - 5.2 (g/dL)   AST 24  0 - 37 (U/L)   ALT 8  0 - 35 (U/L)   Alkaline Phosphatase 64  39 - 117 (U/L)   Total Bilirubin 0.6  0.3 -  1.2 (mg/dL)   GFR calc non Af Amer 71 (*) >90 (mL/min)   GFR calc Af Amer 83 (*) >90 (mL/min)  PROTIME-INR     Status: Normal   Collection Time   09/25/11  1:05 PM      Component Value Range   Prothrombin Time 14.2  11.6 - 15.2 (seconds)   INR 1.08  0.00 - 1.49   APTT     Status: Abnormal   Collection Time   09/25/11  1:05 PM      Component Value Range   aPTT 23 (*) 24 - 37 (seconds)  TYPE AND SCREEN     Status: Normal   Collection Time   09/25/11  1:05 PM      Component Value Range   ABO/RH(D) B POS     Antibody Screen NEG     Sample Expiration 09/28/2011    ABO/RH     Status: Normal   Collection Time   09/25/11  1:15 PM      Component Value Range   ABO/RH(D) B POS    CBC     Status: Abnormal   Collection Time   09/25/11  5:20 PM      Component Value Range   WBC 11.4 (*) 4.0 - 10.5 (K/uL)   RBC 3.52 (*) 3.87 - 5.11 (MIL/uL)   Hemoglobin 10.8 (*) 12.0 - 15.0 (g/dL)   HCT 11.9 (*) 14.7 - 46.0 (%)   MCV 88.4  78.0 - 100.0 (fL)   MCH 30.7  26.0 - 34.0 (pg)   MCHC 34.7  30.0 - 36.0 (g/dL)   RDW 82.9  56.2 - 13.0 (%)   Platelets 152  150 - 400 (K/uL)  MAGNESIUM     Status: Abnormal   Collection Time   09/25/11  5:20 PM      Component Value Range   Magnesium 1.3 (*) 1.5 - 2.5 (mg/dL)  TSH  Status: Normal   Collection Time   09/25/11  5:20 PM      Component Value Range   TSH 1.806  0.350 - 4.500 (uIU/mL)  CARDIAC PANEL(CRET KIN+CKTOT+MB+TROPI)     Status: Abnormal   Collection Time   09/25/11  5:20 PM      Component Value Range   Total CK 547 (*) 7 - 177 (U/L)   CK, MB 4.6 (*) 0.3 - 4.0 (ng/mL)   Troponin I <0.30  <0.30 (ng/mL)   Relative Index 0.8  0.0 - 2.5   CBC     Status: Abnormal   Collection Time   09/25/11 10:42 PM      Component Value Range   WBC 12.0 (*) 4.0 - 10.5 (K/uL)   RBC 3.08 (*) 3.87 - 5.11 (MIL/uL)   Hemoglobin 9.4 (*) 12.0 - 15.0 (g/dL)   HCT 47.8 (*) 29.5 - 46.0 (%)   MCV 89.0  78.0 - 100.0 (fL)   MCH 30.5  26.0 - 34.0 (pg)   MCHC 34.3  30.0  - 36.0 (g/dL)   RDW 62.1  30.8 - 65.7 (%)   Platelets 142 (*) 150 - 400 (K/uL)  CARDIAC PANEL(CRET KIN+CKTOT+MB+TROPI)     Status: Abnormal   Collection Time   09/26/11 12:35 AM      Component Value Range   Total CK 764 (*) 7 - 177 (U/L)   CK, MB 7.8 (*) 0.3 - 4.0 (ng/mL)   Troponin I <0.30  <0.30 (ng/mL)   Relative Index 1.0  0.0 - 2.5   CBC     Status: Abnormal   Collection Time   09/26/11  5:15 AM      Component Value Range   WBC 10.9 (*) 4.0 - 10.5 (K/uL)   RBC 2.55 (*) 3.87 - 5.11 (MIL/uL)   Hemoglobin 7.7 (*) 12.0 - 15.0 (g/dL)   HCT 84.6 (*) 96.2 - 46.0 (%)   MCV 89.8  78.0 - 100.0 (fL)   MCH 30.2  26.0 - 34.0 (pg)   MCHC 33.6  30.0 - 36.0 (g/dL)   RDW 95.2  84.1 - 32.4 (%)   Platelets 125 (*) 150 - 400 (K/uL)  COMPREHENSIVE METABOLIC PANEL     Status: Abnormal   Collection Time   09/26/11  5:15 AM      Component Value Range   Sodium 133 (*) 135 - 145 (mEq/L)   Potassium 4.0  3.5 - 5.1 (mEq/L)   Chloride 98  96 - 112 (mEq/L)   CO2 24  19 - 32 (mEq/L)   Glucose, Bld 118 (*) 70 - 99 (mg/dL)   BUN 17  6 - 23 (mg/dL)   Creatinine, Ser 4.01  0.50 - 1.10 (mg/dL)   Calcium 7.8 (*) 8.4 - 10.5 (mg/dL)   Total Protein 5.0 (*) 6.0 - 8.3 (g/dL)   Albumin 2.7 (*) 3.5 - 5.2 (g/dL)   AST 24  0 - 37 (U/L)   ALT 8  0 - 35 (U/L)   Alkaline Phosphatase 40  39 - 117 (U/L)   Total Bilirubin 0.3  0.3 - 1.2 (mg/dL)   GFR calc non Af Amer 72 (*) >90 (mL/min)   GFR calc Af Amer 83 (*) >90 (mL/min)  CARDIAC PANEL(CRET KIN+CKTOT+MB+TROPI)     Status: Abnormal   Collection Time   09/26/11  8:35 AM      Component Value Range   Total CK 706 (*) 7 - 177 (U/L)   CK, MB 7.4 (*) 0.3 -  4.0 (ng/mL)   Troponin I <0.30  <0.30 (ng/mL)   Relative Index 1.0  0.0 - 2.5     Micro: No results found for this or any previous visit (from the past 240 hour(s)).  Studies/Results: Dg Hip Complete Left  09/25/2011  *RADIOLOGY REPORT*  Clinical Data: Fall.  Left hip pain.  LEFT HIP - COMPLETE 2+ VIEW   Comparison: None.  Findings: Intertrochanteric left femur fracture with mild varus deformity is noted.  Mild displacement of the lesser trochanter. Hardware is present in the proximal right femur with subtrochanteric deformity.  No breakage or loosening of the hardware.  Osteopenia.  IMPRESSION: Acute intertrochanteric left femur fracture. Per CMS PQRS reporting requirements (PQRS Measure 24): Given the patient's age of greater than 50 and the fracture site (hip, distal radius, or spine), the patient should be tested for osteoporosis using DXA, and the appropriate treatment considered based on the DXA results.  Original Report Authenticated By: Donavan Burnet, M.D.   Dg Chest Port 1 View  09/25/2011  *RADIOLOGY REPORT*  Clinical Data: Preop  PORTABLE CHEST - 1 VIEW  Comparison: 12/12/2010  Findings: Cardiomediastinal silhouette is stable.  There is kyphosis and dextroscoliosis of thoracic spine.  Diffuse osteopenia is noted.  Chronic fracture deformity noted bilateral proximal humerus.  No acute infiltrate or pulmonary edema.  Stable chronic interstitial prominence.  IMPRESSION: No active disease.  Stable hyperinflation and chronic interstitial prominence.  There is kyphosis and dextroscoliosis of thoracic spine.  Chronic fracture deformity bilateral proximal humerus .  Original Report Authenticated By: Natasha Mead, M.D.   Dg C-arm 61-120 Min-no Report  09/25/2011  CLINICAL DATA: surgery   C-ARM 61-120 MINUTES  Fluoroscopy was utilized by the requesting physician.  No radiographic  interpretation.      Medications:  Scheduled Meds:   . amLODipine  5 mg Oral Daily  . benazepril  20 mg Oral Daily   And  . hydrochlorothiazide  12.5 mg Oral Daily  . cefTRIAXone (ROCEPHIN)  IV  1 g Intravenous Q24H  . docusate sodium  100 mg Oral BID  . enoxaparin  40 mg Subcutaneous Q24H  . fentaNYL  50 mcg Intravenous Once  . levothyroxine  100 mcg Oral QAC breakfast  . metoprolol  100 mg Oral BID  .  oxyCODONE-acetaminophen  1 tablet Oral Once  . potassium chloride  20 mEq Oral Once  . DISCONTD: acetaminophen  1,000 mg Intravenous Q6H  . DISCONTD: benazepril-hydrochlorthiazide  1 tablet Oral Daily  . DISCONTD: docusate sodium  100 mg Oral BID  . DISCONTD: enoxaparin  40 mg Subcutaneous Q24H  . DISCONTD: levalbuterol  0.63 mg Nebulization Q6H  . DISCONTD: metoprolol tartrate  50 mg Oral BID  . DISCONTD: senna  1 tablet Oral BID   Continuous Infusions:   . sodium chloride 75 mL/hr at 09/26/11 0418  . DISCONTD: sodium chloride     PRN Meds:.acetaminophen, acetaminophen, alum & mag hydroxide-simeth, bisacodyl, HYDROmorphone, levalbuterol, menthol-cetylpyridinium, methocarbamol (ROBAXIN) IV, methocarbamol, metoCLOPramide (REGLAN) injection, metoCLOPramide, ondansetron (ZOFRAN) IV, ondansetron, phenol, traMADol, DISCONTD: acetaminophen, DISCONTD: acetaminophen, DISCONTD: fentaNYL, DISCONTD: ondansetron (ZOFRAN) IV, DISCONTD: ondansetron, DISCONTD: promethazine   Assessment: Principal Problem:  *Hip fracture Active Problems:  Hypertension  Osteoporosis  Hypothyroidism  UTI (lower urinary tract infection)   Plan: #1 left intertrochanteric fracture, Valeria Batman, MD  been consulted by EDP, she is status post closed reduction and intramedullary nail on the left. Given the patient's long-standing history of hypertension, the patient is probably at moderate risk  in the perioperative setting and for  postoperative complications and this was explained to the family. 2-D echo is pending The patient does not give me any history of any arrhythmias, CHF. Will start low-dose beta blocker in the perioperative setting   #2 UTI start the patient on Rocephin  #3 leukocytosis probably stress margination could also be secondary UTI  #4 hypertension continue Norvasc and metoprolol  #5 anemia continue to monitor, hemoglobin dropped to 7.7, we will transfuse 2 units of packed blood cells She is a  full code    LOS: 1 day   [facility] 09/26/2011, 12:06 PM

## 2011-09-26 NOTE — Progress Notes (Signed)
UR completed 

## 2011-09-26 NOTE — Progress Notes (Signed)
  Echocardiogram 2D Echocardiogram has been performed.  Misty Shah L 09/26/2011, 9:52 AM

## 2011-09-26 NOTE — Progress Notes (Signed)
   CARE MANAGEMENT NOTE 09/26/2011  Patient:  STINA, GANE   Account Number:  [id number]  Date Initiated:  09/26/2011  Documentation initiated by:  Raiford Noble  Subjective/Objective Assessment:   pt adm s/p mechanical fall, s/p im nailing, uti     Action/Plan:   lived alone pta indep-- dcp home w/hhc vs cir vs snf   Anticipated DC Date:  09/28/2011   Anticipated DC Plan:  HOME W HOME HEALTH SERVICES  In-house referral  NA      DC Planning Services  CM consult      [facility] Choice  HOME HEALTH   Choice offered to / List presented to:  NA   DME arranged  NA      DME agency  NA        [facility] agency  [facility]   Status of service:  In process, will continue to follow Medicare Important Message given?   (If response is "NO", the following Medicare IM given date fields will be blank) Date Medicare IM given:   Date Additional Medicare IM given:    Discharge Disposition:  HOME W HOME HEALTH SERVICES  Per UR Regulation:  Reviewed for med. necessity/level of care/duration of stay  If discussed at Long Length of Stay Meetings, dates discussed:    Comments:  09-26-11 Raiford Noble, RN,BSN,CM [phone] Pt s/p IM nailing surgery. Will await PT/OT evals. Spoke with Norberta Keens with [facility] to confirm whether [facility] can accept patient or not. Darl Pikes states [facility] can accept patient. Also left vm for Sherrie with CIR to see if patient would be a candidate for CIR. Will cont to follow to see what therapy recommendations are.

## 2011-09-26 NOTE — Progress Notes (Signed)
CRITICAL VALUE ALERT  Critical value received:  CK, MB 7.8  Date of notification:  09/26/11  Time of notification:  0035  Critical value read back:yes  Nurse who received alert:  Lucillie Garfinkel  MD notified (1st page):  Merdis Delay  Time of first page:  0055  MD notified (2nd page):  Time of second page: 0220  Responding MD:  Merdis Delay  Time MD responded:  [phone]

## 2011-09-27 DIAGNOSIS — E119 Type 2 diabetes mellitus without complications: Secondary | ICD-10-CM

## 2011-09-27 DIAGNOSIS — W19XXXA Unspecified fall, initial encounter: Secondary | ICD-10-CM

## 2011-09-27 DIAGNOSIS — I10 Essential (primary) hypertension: Secondary | ICD-10-CM

## 2011-09-27 DIAGNOSIS — S72143A Displaced intertrochanteric fracture of unspecified femur, initial encounter for closed fracture: Secondary | ICD-10-CM

## 2011-09-27 DIAGNOSIS — E782 Mixed hyperlipidemia: Secondary | ICD-10-CM

## 2011-09-27 DIAGNOSIS — S72009A Fracture of unspecified part of neck of unspecified femur, initial encounter for closed fracture: Secondary | ICD-10-CM

## 2011-09-27 LAB — TYPE AND SCREEN
Antibody Screen: NEGATIVE
Unit division: 0

## 2011-09-27 LAB — URINE CULTURE

## 2011-09-27 LAB — CBC
HCT: 31.4 % — ABNORMAL LOW (ref 36.0–46.0)
MCV: 86 fL (ref 78.0–100.0)
RBC: 3.65 MIL/uL — ABNORMAL LOW (ref 3.87–5.11)
RDW: 14.6 % (ref 11.5–15.5)
WBC: 10.5 10*3/uL (ref 4.0–10.5)

## 2011-09-27 LAB — BASIC METABOLIC PANEL
BUN: 18 mg/dL (ref 6–23)
CO2: 26 mEq/L (ref 19–32)
Chloride: 98 mEq/L (ref 96–112)
Creatinine, Ser: 0.79 mg/dL (ref 0.50–1.10)

## 2011-09-27 NOTE — Evaluation (Signed)
Occupational Therapy Evaluation Patient Details Name: Misty Shah MRN: [id number] DOB: February 08, 1916 Today's Date: 09/27/2011 Time: 0940-1001 OT Time Calculation (min): 21 min  OT Assessment / Plan / Recommendation Clinical Impression  This 78 year old female was admitted with L hip fx and is s/p IM nail with WBAT.  Prior to admission, pt lived alone and was independent with BADLs and light housekeeping.  She now requires mod A to stand, min A to ambulate short distances and mod to max A for LB ADLs.  She is limited by pain and activity tolerance, but she is very motivated and will benefit from continued OT    OT Assessment  Patient needs continued OT Services    Follow Up Recommendations  Inpatient Rehab    Equipment Recommendations  Defer to next venue    Frequency Min 2X/week    Precautions / Restrictions Precautions Precautions: Fall Restrictions LLE Weight Bearing: Weight bearing as tolerated   Pertinent Vitals/Pain BP sitting 153/107; pain 2--increases with weight bearing    ADL  Eating/Feeding: Simulated;Independent Where Assessed - Eating/Feeding: Chair Grooming: Simulated;Set up Where Assessed - Grooming: Unsupported sitting Upper Body Bathing: Simulated;Set up Where Assessed - Upper Body Bathing: Unsupported;Sitting, chair Lower Body Bathing: Performed;Moderate assistance Where Assessed - Lower Body Bathing: Sit to stand from chair Upper Body Dressing: Simulated;Minimal assistance;Other (comment) (iv) Where Assessed - Upper Body Dressing: Sitting, chair;Unsupported Lower Body Dressing: Simulated;Maximal assistance Where Assessed - Lower Body Dressing: Sit to stand from chair Toilet Transfer: Performed;Minimal assistance;Other (comment) (mod A to stand; mod sequencing cues) Toilet Transfer Method: Proofreader: Bedside commode Toileting - Clothing Manipulation: Simulated;Minimal assistance Where Assessed - Toileting Clothing Manipulation: Sit  to stand from 3-in-1 or toilet Toileting - Hygiene: Performed;Minimal assistance Where Assessed - Toileting Hygiene: Sit to stand from 3-in-1 or toilet Tub/Shower Transfer: Not assessed Equipment Used: Reacher Ambulation Related to ADLs: cotx with PT. Ambulated 7 feet to bsc:  Mod vcs for safety and sequence ADL Comments: adl was done this am.  simulated reach    OT Goals Acute Rehab OT Goals OT Goal Formulation: With patient Time For Goal Achievement: 10/04/11 Potential to Achieve Goals: Good ADL Goals Pt Will Perform Grooming: with supervision;Standing at sink;Other (comment) (3 tasks) ADL Goal: Grooming - Progress: Goal set today Pt Will Perform Lower Body Bathing: with supervision;Sit to stand from chair;with adaptive equipment;Other (comment) (min A for sit to stand) ADL Goal: Lower Body Bathing - Progress: Goal set today Pt Will Perform Lower Body Dressing: with min assist;with adaptive equipment;Sit to stand from chair ADL Goal: Lower Body Dressing - Progress: Goal set today Pt Will Transfer to Toilet: with supervision;Ambulation;3-in-1;Other (comment) (min A for sit to stand) ADL Goal: Toilet Transfer - Progress: Goal set today Pt Will Perform Toileting - Clothing Manipulation: with supervision;Standing (min A for sit to stand) ADL Goal: Toileting - Clothing Manipulation - Progress: Goal set today Pt Will Perform Toileting - Hygiene: with supervision;Standing at 3-in-1/toilet (min A for sit to stand) ADL Goal: Toileting - Hygiene - Progress: Goal set today  Visit Information  Last OT Received On: 09/27/11 Assistance Needed: +1    Subjective Data  Subjective: "I could try (to use the bathroom)" Patient Stated Goal: get back to being independent   Prior Functioning  Home Living Lives With: Alone Available Help at Discharge: Family;Available PRN/intermittently Type of Home: House Home Access: Stairs to enter Entrance Stairs-Number of Steps: 1 Home Layout: Two level;Full  bath on main level;Able to live  on main level with bedroom/bathroom Bathroom Shower/Tub: Tub/shower unit;Curtain Bathroom Toilet: Standard How Accessible: Accessible via walker Home Adaptive Equipment: Grab bars in shower;Hand-held shower hose;Shower chair with back;Walker - rolling;Straight cane;Bedside commode/3-in-1 Prior Function Level of Independence: Independent Needs Assistance:  (does light housework; son does Presenter, broadcasting and gets food) Driving: No Communication Communication: No difficulties Dominant Hand: Right    Cognition  Overall Cognitive Status: Appears within functional limits for tasks assessed/performed Arousal/Alertness: Awake/alert Orientation Level: Appears intact for tasks assessed Behavior During Session: [facility] for tasks performed    Extremity/Trunk Assessment Right Upper Extremity Assessment RUE ROM/Strength/Tone: Deficits RUE ROM/Strength/Tone Deficits: h/o bil fx'd shoulders. can lift approximately 60 degrees Left Upper Extremity Assessment LUE ROM/Strength/Tone: Deficits LUE ROM/Strength/Tone Deficits: can lift to 90 flexion, AROM   Mobility Bed Mobility Bed Mobility: Sit to Supine Sit to Supine: 2: Max assist Details for Bed Mobility Assistance: mod vcs Transfers Sit to Stand: 3: Mod assist;With upper extremity assist;With armrests;From chair/3-in-1 Stand to Sit: 3: Mod assist;To chair/3-in-1;To bed;With armrests;With upper extremity assist (sat prematurely when getting back to bed) Details for Transfer Assistance: VCs safety, technique, hand placement, posture. Assist to rise, stabilize, control descent. Assist to stabilize and navigate with RW. Increased time. Difficulty WBing on L LE.    Exercise    Balance Static Sitting Balance Static Sitting - Level of Assistance: 7: Independent  End of Session OT - End of Session Activity Tolerance: Patient limited by fatigue;Patient limited by pain Patient left: in bed;with call bell/phone within reach  [facility], OTR/L [phone] 09/27/2011 [name] 09/27/2011, 10:33 AM

## 2011-09-27 NOTE — Progress Notes (Signed)
PATIENT ID: Misty Shah        MRN:  [id number]          DOB/AGE: 1916/02/17 / 78 y.o.  Norlene Campbell, MD   Jacqualine Code, PA-C [address] [place], [place], [place]  [address]                             [phone]   PROGRESS NOTE  Subjective:  negative for Chest Pain  negative for Shortness of Breath  positive for Nausea/Vomiting   Nausea only no emesis  negative for Calf Pain  negative for Bowel Movement   Tolerating Diet: yes         Patient reports pain as mild.     Very comfortable and talkative today.  Apparently tolerated OOB to chair.  Denies any numbness or tingling.  Objective: Vital signs in last 24 hours:   Patient Vitals for the past 24 hrs:  BP Temp Temp src Pulse Resp SpO2  09/27/11 1549 - - - - 18  98 %  09/27/11 1351 130/50 mmHg 97.9 F (36.6 C) Oral 84  20  97 %  09/27/11 1200 - - - - 18  98 %  09/27/11 1046 127/77 mmHg 98.5 F (36.9 C) Oral 78  20  98 %  09/27/11 1000 148/71 mmHg - - 92  18  87 %  09/27/11 0940 153/107 mmHg - - - - -  09/27/11 0743 - - - - 20  88 %  09/27/11 0607 119/82 mmHg 97.8 F (36.6 C) Oral 78  19  97 %  09/27/11 0236 149/67 mmHg 97.5 F (36.4 C) Oral 83  16  95 %  09/26/11 2335 118/48 mmHg 97.3 F (36.3 C) Oral 80  18  -  09/26/11 2215 135/50 mmHg 98.2 F (36.8 C) Oral 71  18  -  09/26/11 2115 118/61 mmHg 98.5 F (36.9 C) Oral 77  18  -  09/26/11 2100 144/71 mmHg 98.5 F (36.9 C) Oral 83  17  -  09/26/11 1815 117/40 mmHg 99 F (37.2 C) Oral 72  19  -  09/26/11 1710 110/43 mmHg 99.1 F (37.3 C) Oral 70  19  -      Intake/Output from previous day:   04/30 0701 - 05/01 0700 In: 1456 [P.O.:480; I.V.:350] Out: 1150 [Urine:1150]   Intake/Output this shift:   05/01 0701 - 05/01 1900 In: 120 [P.O.:120] Out: -    Intake/Output      04/30 0701 - 05/01 0700 05/01 0701 - 05/02 0700   P.O. 480 120   I.V. (mL/kg) 350 (6.4)    Blood 626    IV Piggyback     Total Intake(mL/kg) 1456 (26.7) 120 (2.2)   Urine (mL/kg/hr)  1150 (0.9)    Blood     Total Output 1150    Net +306 +120        Urine Occurrence 1 x 2 x   Stool Occurrence 1 x       LABORATORY DATA:  Basename 09/27/11 0456 09/26/11 0515 09/25/11 2242 09/25/11 1720 09/25/11 1305  WBC 10.5 10.9* 12.0* 11.4* 17.0*  HGB 10.8* 7.7* 9.4* 10.8* 11.9*  HCT 31.4* 22.9* 27.4* 31.1* 34.8*  PLT 96* 125* 142* 152 178    Basename 09/27/11 0456 09/26/11 0515 09/25/11 1305  NA 132* 133* 131*  K 3.3* 4.0 4.0  CL 98 98 92*  CO2 26 24 26   BUN  18 17 20   CREATININE 0.79 0.72 0.73  GLUCOSE 136* 118* 171*  CALCIUM 7.6* 7.8* 9.5   Lab Results  Component Value Date   INR 1.08 09/25/2011    Examination:  General appearance: alert, cooperative and mild distress  Wound Exam: clean, dry, intact   Drainage:  None: wound tissue dry  Motor Exam: EHL, FHL, Anterior Tibial and Posterior Tibial Intact  Sensory Exam: Superficial Peroneal, Deep Peroneal and Tibial normal  Vascular Exam: Left dorsalis pedis artery has trace pulse  Assessment:    2 Days Post-Op  Procedure(s) (LRB): INTRAMEDULLARY (IM) NAIL FEMORAL (Left)  ADDITIONAL DIAGNOSIS:  Principal Problem:  *Hip fracture Active Problems:  Hypertension  Osteoporosis  Hypothyroidism  UTI (lower urinary tract infection)  Acute Blood Loss Anemia   Plan: Physical Therapy as ordered Weight Bearing as Tolerated (WBAT)  DVT Prophylaxis:  Lovenox  DISCHARGE PLAN: possible Home vs CIR.  Awaiting CIR evaluation  DISCHARGE NEEDS: HHPT, Walker and 3-in-1 comode seat         Laprecious Austill 09/27/2011, 4:42 PM

## 2011-09-27 NOTE — Progress Notes (Signed)
Physical Therapy Treatment Patient Details Name: Misty Shah MRN: [id number] DOB: Jan 12, 1916 Today's Date: 09/27/2011 Time: 0940-1001 PT Time Calculation (min): 21 min  PT Assessment / Plan / Recommendation Comments on Treatment Session  Improved performance this session. Pt c/o some nausea with mobility. Limited by pain primarily but pt forth maximal effort. Fatigues easily.     Follow Up Recommendations  Inpatient Rehab;Skilled nursing facility    Equipment Recommendations  Defer to next venue    Frequency Min 5X/week   Plan Discharge plan remains appropriate    Precautions / Restrictions Precautions Precautions: Fall Restrictions LLE Weight Bearing: Weight bearing as tolerated   Pertinent Vitals/Pain     Mobility  Bed Mobility Bed Mobility: Sit to Supine Sit to Supine: 2: Max assist Details for Bed Mobility Assistance: VCs safety, technique, hand placement. Increased time. Assist for bil LEs onto bed and trunk to supine.  Transfers Transfers: Sit to Stand;Stand to Sit Sit to Stand: 3: Mod assist;With upper extremity assist;With armrests;From chair/3-in-1 Stand to Sit: 3: Mod assist;To chair/3-in-1;To bed;With armrests;With upper extremity assist Details for Transfer Assistance: VCs safety, technique, hand placement, posture. Assist to rise, stabilize, control descent. Assist to stabilize and navigate with RW. Increased time. Difficulty WBing on L LE.  Ambulation/Gait Ambulation Distance (Feet): Mod Assist. 7 Feet (x 2. in room.) Assistive device: Rolling walker Ambulation/Gait Assistance Details: VCs safety, technique, sequence, posture. Assist to stabilze and navigate with RW. Difficulty WBing on L LE. Increased time.  Gait Pattern: Step-to pattern;Trunk flexed;Antalgic;Decreased stride length;Decreased step length - right;Decreased step length - left    Exercises     PT Goals Acute Rehab PT Goals PT Goal: Sit to Stand - Progress: Progressing toward goal PT Goal:  Stand to Sit - Progress: Progressing toward goal PT Goal: Ambulate - Progress: Progressing toward goal  Visit Information  Last PT Received On: 09/27/11 Assistance Needed: +1    Subjective Data  Subjective: "I'm cold" Patient Stated Goal: Get better   Cognition  Overall Cognitive Status: Appears within functional limits for tasks assessed/performed Arousal/Alertness: Awake/alert Orientation Level: Appears intact for tasks assessed Behavior During Session: [facility] for tasks performed    Balance     End of Session PT - End of Session Equipment Utilized During Treatment: Gait belt Activity Tolerance: Patient limited by pain Patient left: in bed;with call bell/phone within reach    Rebeca Alert Shemere 09/27/2011, 10:12 AM [phone]

## 2011-09-27 NOTE — Progress Notes (Signed)
Subjective: Patient states that she feels well today. She has deferred decision making about disposition to her son. The plan is for the patient be discharged to inpatient rehabilitation.  Objective: Filed Vitals:   09/27/11 1000 09/27/11 1046 09/27/11 1200 09/27/11 1351  BP: 148/71 127/77  130/50  Pulse: 92 78  84  Temp:  98.5 F (36.9 C)  97.9 F (36.6 C)  TempSrc:  Oral  Oral  Resp: 18 20 18 20   Height:      Weight:      SpO2: 87% 98% 98% 97%   Weight change:   Intake/Output Summary (Last 24 hours) at 09/27/11 1440 Last data filed at 09/27/11 1222  Gross per 24 hour  Intake   1336 ml  Output   1150 ml  Net    186 ml    General: Alert, awake, oriented x3, in no acute distress.  HEENT: [place]/AT PEERL, EOMI Neck: Trachea midline,  no masses, no thyromegal,y no JVD, no carotid bruit OROPHARYNX:  Moist, No exudate/ erythema/lesions.  Heart: Regular rate and rhythm, without murmurs, rubs, gallops, PMI non-displaced, no heaves or thrills on palpation.  Lungs: Clear to auscultation, no wheezing or rhonchi noted. No increased vocal fremitus resonant to percussion  Abdomen: Soft, nontender, nondistended, positive bowel sounds, no masses no hepatosplenomegaly noted..  Neuro: No focal neurological deficits noted cranial nerves II through XII grossly intact. DTRs 2+ bilaterally upper and lower extremities. Strength 5 out of 5 in bilateral upper and lower extremities. Musculoskeletal: No warm swelling or erythema around joints, no spinal tenderness noted. Psychiatric: Patient alert and oriented x3, good insight and cognition, good recent to remote recall. Lymph node survey: No cervical axillary or inguinal lymphadenopathy noted.     Lab Results:  Basename 09/27/11 0456 09/26/11 0515 09/25/11 1720  NA 132* 133* --  K 3.3* 4.0 --  CL 98 98 --  CO2 26 24 --  GLUCOSE 136* 118* --  BUN 18 17 --  CREATININE 0.79 0.72 --  CALCIUM 7.6* 7.8* --  MG -- -- 1.3*  PHOS -- -- --     Basename 09/26/11 0515 09/25/11 1305  AST 24 24  ALT 8 8  ALKPHOS 40 64  BILITOT 0.3 0.6  PROT 5.0* 7.4  ALBUMIN 2.7* 4.2   No results found for this basename: LIPASE:2,AMYLASE:2 in the last 72 hours  Basename 09/27/11 0456 09/26/11 0515 09/25/11 1305  WBC 10.5 10.9* --  NEUTROABS -- -- 15.2*  HGB 10.8* 7.7* --  HCT 31.4* 22.9* --  MCV 86.0 89.8 --  PLT 96* 125* --    Basename 09/26/11 0835 09/26/11 0035 09/25/11 1720  CKTOTAL 706* 764* 547*  CKMB 7.4* 7.8* 4.6*  CKMBINDEX -- -- --  TROPONINI <0.30 <0.30 <0.30   No components found with this basename: POCBNP:3 No results found for this basename: DDIMER:2 in the last 72 hours No results found for this basename: HGBA1C:2 in the last 72 hours No results found for this basename: CHOL:2,HDL:2,LDLCALC:2,TRIG:2,CHOLHDL:2,LDLDIRECT:2 in the last 72 hours  Basename 09/25/11 1720  TSH 1.806  T4TOTAL --  T3FREE --  THYROIDAB --   No results found for this basename: VITAMINB12:2,FOLATE:2,FERRITIN:2,TIBC:2,IRON:2,RETICCTPCT:2 in the last 72 hours  Micro Results: Recent Results (from the past 240 hour(s))  URINE CULTURE     Status: Normal   Collection Time   09/25/11  1:00 PM      Component Value Range Status Comment   Specimen Description URINE, CATHETERIZED   Final    Special  Requests NONE   Final    Culture  Setup Time [id number]   Final    Colony Count >=100,000 COLONIES/ML   Final    Culture KLEBSIELLA PNEUMONIAE   Final    Report Status 09/27/2011 FINAL   Final    Organism ID, Bacteria KLEBSIELLA PNEUMONIAE   Final     Studies/Results: Dg Hip Complete Left  09/25/2011  *RADIOLOGY REPORT*  Clinical Data: Fall.  Left hip pain.  LEFT HIP - COMPLETE 2+ VIEW  Comparison: None.  Findings: Intertrochanteric left femur fracture with mild varus deformity is noted.  Mild displacement of the lesser trochanter. Hardware is present in the proximal right femur with subtrochanteric deformity.  No breakage or loosening of the  hardware.  Osteopenia.  IMPRESSION: Acute intertrochanteric left femur fracture. Per CMS PQRS reporting requirements (PQRS Measure 24): Given the patient's age of greater than 50 and the fracture site (hip, distal radius, or spine), the patient should be tested for osteoporosis using DXA, and the appropriate treatment considered based on the DXA results.  Original Report Authenticated By: Donavan Burnet, M.D.   Dg Femur Left  09/26/2011  *RADIOLOGY REPORT*  Clinical Data:  78 year old female undergoing left hip ORIF.    DG C-ARM 61-120 MIN - NRPT [facility], LEFT FEMUR - 2 VIEW  Technique:  five intraoperative fluoroscopic views of the left femur.  Fluoroscopy time of 1.0 minutes was utilized.     Comparison:  preoperative study 09/25/2011.  Findings:  left femur intramedullary rod in place.  Proximal interlocking dynamic hip screw.  Distal interlocking cortical screw.  These traverse the intertrochanteric fracture.  Alignment appears near anatomic.  IMPRESSION:  ORIF left femur intertrochanteric fracture with no adverse features.  Original Report Authenticated By: Harley Hallmark, M.D.   Dg Chest Port 1 View  09/25/2011  *RADIOLOGY REPORT*  Clinical Data: Preop  PORTABLE CHEST - 1 VIEW  Comparison: 12/12/2010  Findings: Cardiomediastinal silhouette is stable.  There is kyphosis and dextroscoliosis of thoracic spine.  Diffuse osteopenia is noted.  Chronic fracture deformity noted bilateral proximal humerus.  No acute infiltrate or pulmonary edema.  Stable chronic interstitial prominence.  IMPRESSION: No active disease.  Stable hyperinflation and chronic interstitial prominence.  There is kyphosis and dextroscoliosis of thoracic spine.  Chronic fracture deformity bilateral proximal humerus .  Original Report Authenticated By: Natasha Mead, M.D.   Dg C-arm 61-120 Min-no Report  09/25/2011  CLINICAL DATA: surgery   C-ARM 61-120 MINUTES  Fluoroscopy was utilized by the requesting physician.  No radiographic   interpretation.      Medications: I have reviewed the patient's current medications. Scheduled Meds:   . amLODipine  5 mg Oral Daily  . benazepril  20 mg Oral Daily   And  . hydrochlorothiazide  12.5 mg Oral Daily  . cefTRIAXone (ROCEPHIN)  IV  1 g Intravenous Q24H  . docusate sodium  100 mg Oral BID  . enoxaparin  40 mg Subcutaneous Q24H  . furosemide  20 mg Intravenous Once  . levothyroxine  100 mcg Oral QAC breakfast  . metoprolol  100 mg Oral BID   Continuous Infusions:   . sodium chloride 75 mL/hr at 09/26/11 0418   PRN Meds:.acetaminophen, acetaminophen, alum & mag hydroxide-simeth, bisacodyl, HYDROmorphone, levalbuterol, menthol-cetylpyridinium, methocarbamol (ROBAXIN) IV, methocarbamol, metoCLOPramide (REGLAN) injection, metoCLOPramide, ondansetron (ZOFRAN) IV, ondansetron, phenol, traMADol Assessment/Plan:  Problem List: UTI (lower urinary tract infection) (09/25/2011)   Assessment: Urine culture shows Klebsiella pneumonia sensitive to Rocephin. Patient on day #2  of Rocephin. We'll continue for a total of 7 days. Please pathogen also sensitive to the ciprofloxacin which can be used as an option for outpatient treatment    Hip fracture (09/25/2011)   Assessment: Patient is postop day 2 of ORIF of the left femur. Patient has started working with physical therapy weightbearing as tolerated and the plan is to go to inpatient rehabilitation for further therapy before going home     Hypertension (09/25/2011)   Assessment: Blood pressure adequately control   Osteoporosis (09/25/2011)   Assessment: Patient has been started on calcium and vitamin D    Hypothyroidism (09/25/2011)   Assessment: A normal TSH of 1.806 shows adequate replacement. We'll continue with current dose of Synthroid      LOS: 2 days

## 2011-09-27 NOTE — Consult Note (Addendum)
Physical Medicine and Rehabilitation Consult Reason for Consult: Left displaced intertrochanteric hip fracture Referring Phsyician: Dr. Evelena Leyden Misty Shah is an 78 y.o. female.   HPI: 78 year old right-handed white female admitted 09/25/2011 after a fall. Patient lives alone and uses a walker at times. Patient with multiple falls in the past. Sustained a left displaced intertrochanteric hip fracture. Underwent closed reduction and internal fixation with intramedullary nail 09/25/2011 per Dr. Cleophas Dunker. Weightbearing as tolerated and placed on subcutaneous Lovenox for deep vein thrombosis prophylaxis. Postoperative anemia 7.7 she was transfused with followup hemoglobin 10.8. Physical therapy evaluation completed 09/26/2011 with recommendations for physical medicine rehabilitation consult to consider inpatient rehabilitation services  Review of Systems  HENT: Positive for hearing loss.   Gastrointestinal: Positive for constipation.  Musculoskeletal: Positive for falls.  Psychiatric/Behavioral:       Anxiety  All other systems reviewed and are negative.   Past Medical History  Diagnosis Date  . Hypertension   . Arthritis   . Hypothyroidism   . UTI (lower urinary tract infection)   . Anxiety   . Claustrophobia   . Dislocated shoulder     hx bilateral shoulders - had therapy on them  . Shingles    Past Surgical History  Procedure Date  . Cholecystectomy   . Thyroidectomy   . Tonsillectomy   . Appendectomy   . Hip surgery     right hip  . Colonoscopy   . Cataract extraction, bilateral    History reviewed. No pertinent family history. Social History:  reports that she has never smoked. She has never used smokeless tobacco. She reports that she does not drink alcohol or use illicit drugs. Allergies:  Allergies  Allergen Reactions  . Codeine   . Epinephrine   . Sulfur   . Tetanus Toxoids    Medications Prior to Admission  Medication Sig Dispense Refill  . amLODipine  (NORVASC) 5 MG tablet Take 5 mg by mouth daily.      Marland Kitchen aspirin EC 81 MG tablet Take 81 mg by mouth daily.      Marland Kitchen levothyroxine (SYNTHROID, LEVOTHROID) 100 MCG tablet Take 100 mcg by mouth daily.      . metoprolol (LOPRESSOR) 100 MG tablet Take 100 mg by mouth 2 (two) times daily.      . traMADol (ULTRAM) 50 MG tablet Take 50 mg by mouth every 6 (six) hours as needed. For pain relief        Home: Home Living Lives With: Alone Available Help at Discharge: Family;Available PRN/intermittently (not sure that 24 hour assist can be arranged.  ) Type of Home: House Home Access: Stairs to enter [employer] of Steps: 1 Entrance Stairs-Rails:  (has something to hold to there) Home Layout: Two level;Full bath on main level;Able to live on main level with bedroom/bathroom (doesn't go upstairs.  ) Bathroom Shower/Tub: Tub/shower unit;Curtain (at times takes a sponge bath) Bathroom Toilet: Standard (with 3-in-1) Bathroom Accessibility: Yes How Accessible: Accessible via walker (sideways) Home Adaptive Equipment: Grab bars in shower;Hand-held shower hose;Shower chair with back;Walker - rolling;Straight cane;Bedside commode/3-in-1  Functional History: Prior Function Light Housekeeping:  (needs assist with heavy housework like vacuuming and moping.) Able to Take Stairs?: No (not without assistance from family) Driving: No (has never driven) Functional Status:  Mobility: Bed Mobility Bed Mobility: Supine to Sit;Sit to Supine;Scooting to [facility];Sitting - Scoot to Edge of Bed Supine to Sit: HOB elevated;1: +2 Total assist Supine to Sit: Patient Percentage: 50% Sitting - Scoot to [place] of  Bed: 2: Max assist Sit to Supine: 1: +2 Total assist;HOB flat Sit to Supine: Patient Percentage: 30% Scooting to HOB: 1: +2 Total assist Scooting to [facility]: Patient Percentage:  (0%) Transfers Transfers: Sit to Stand;Stand to Sit;Stand Pivot Transfers Sit to Stand: 1: +2 Total assist;With upper extremity  assist;From bed;From elevated surface;From chair/3-in-1 Sit to Stand: Patient Percentage: 50% Stand to Sit: 1: +2 Total assist;To chair/3-in-1;To bed;With upper extremity assist Stand to Sit: Patient Percentage: 50% Stand Pivot Transfers: 1: +2 Total assist Stand Pivot Transfers: Patient Percentage: 60% Ambulation/Gait Ambulation/Gait Assistance:  (not attempted today due to BP and low blood levels)    ADL:    Cognition: Cognition Arousal/Alertness: Lethargic Orientation Level: Oriented X4 Cognition Overall Cognitive Status: Appears within functional limits for tasks assessed/performed Arousal/Alertness: Lethargic Orientation Level: Appears intact for tasks assessed Behavior During Session: Lethargic Cognition - Other Comments: cognition not formally assessed, but history and conversation flow was normal and confirmed by family.    Blood pressure 149/67, pulse 83, temperature 97.5 F (36.4 C), temperature source Oral, resp. rate 16, height 5\' 1"  (1.549 m), weight 54.432 kg (120 lb), SpO2 95.00%. Physical Exam  Vitals reviewed. Constitutional: She is oriented to person, place, and time. She appears well-developed.  HENT:  Head: Normocephalic.  Eyes: Conjunctivae and EOM are normal. Pupils are equal, round, and reactive to light.  Neck: Neck supple. No thyromegaly present.  Cardiovascular: Regular rhythm.   Pulmonary/Chest: Effort normal and breath sounds normal. She has no wheezes.  Abdominal: She exhibits no distension. There is no tenderness.  Musculoskeletal: She exhibits no edema.       Left hip clean and intact. Appropriately tender.   Arthritic changes in hands bilaterally.   Neurological: She is alert and oriented to person, place, and time. No cranial nerve deficit or sensory deficit.       Follows three-step commands. Difficulty moving left leg at all in bed while supine. Can lift right leg 2/5. Otherwise patients movement 2-3/5.   Skin:       Hip incision is  dressed  Psychiatric: She has a normal mood and affect. Her behavior is normal. Judgment and thought content normal.    Results for orders placed during the hospital encounter of 09/25/11 (from the past 24 hour(s))  CARDIAC PANEL(CRET KIN+CKTOT+MB+TROPI)     Status: Abnormal   Collection Time   09/26/11  8:35 AM      Component Value Range   Total CK 706 (*) 7 - 177 (U/L)   CK, MB 7.4 (*) 0.3 - 4.0 (ng/mL)   Troponin I <0.30  <0.30 (ng/mL)   Relative Index 1.0  0.0 - 2.5   PREPARE RBC (CROSSMATCH)     Status: Normal   Collection Time   09/26/11 12:30 PM      Component Value Range   Order Confirmation ORDER PROCESSED BY BLOOD BANK    CBC     Status: Abnormal (Preliminary result)   Collection Time   09/27/11  4:56 AM      Component Value Range   WBC 10.5  4.0 - 10.5 (K/uL)   RBC 3.65 (*) 3.87 - 5.11 (MIL/uL)   Hemoglobin 10.8 (*) 12.0 - 15.0 (g/dL)   HCT 16.1 (*) 09.6 - 46.0 (%)   MCV 86.0  78.0 - 100.0 (fL)   MCH 29.6  26.0 - 34.0 (pg)   MCHC 34.4  30.0 - 36.0 (g/dL)   RDW 04.5  40.9 - 81.1 (%)   Platelets PENDING  150 - 400 (K/uL)  BASIC METABOLIC PANEL     Status: Abnormal (Preliminary result)   Collection Time   09/27/11  4:56 AM      Component Value Range   Sodium 132 (*) 135 - 145 (mEq/L)   Potassium PENDING  3.5 - 5.1 (mEq/L)   Chloride 98  96 - 112 (mEq/L)   CO2 26  19 - 32 (mEq/L)   Glucose, Bld 136 (*) 70 - 99 (mg/dL)   BUN 18  6 - 23 (mg/dL)   Creatinine, Ser 4.54  0.50 - 1.10 (mg/dL)   Calcium 7.6 (*) 8.4 - 10.5 (mg/dL)   GFR calc non Af Amer 70 (*) >90 (mL/min)   GFR calc Af Amer 81 (*) >90 (mL/min)   Dg Hip Complete Left  09/25/2011  *RADIOLOGY REPORT*  Clinical Data: Fall.  Left hip pain.  LEFT HIP - COMPLETE 2+ VIEW  Comparison: None.  Findings: Intertrochanteric left femur fracture with mild varus deformity is noted.  Mild displacement of the lesser trochanter. Hardware is present in the proximal right femur with subtrochanteric deformity.  No breakage or  loosening of the hardware.  Osteopenia.  IMPRESSION: Acute intertrochanteric left femur fracture. Per CMS PQRS reporting requirements (PQRS Measure 24): Given the patient's age of greater than 50 and the fracture site (hip, distal radius, or spine), the patient should be tested for osteoporosis using DXA, and the appropriate treatment considered based on the DXA results.  Original Report Authenticated By: Donavan Burnet, M.D.   Dg Femur Left  09/26/2011  *RADIOLOGY REPORT*  Clinical Data:  78 year old female undergoing left hip ORIF.    DG C-ARM 61-120 MIN - NRPT [facility], LEFT FEMUR - 2 VIEW  Technique:  five intraoperative fluoroscopic views of the left femur.  Fluoroscopy time of 1.0 minutes was utilized.     Comparison:  preoperative study 09/25/2011.  Findings:  left femur intramedullary rod in place.  Proximal interlocking dynamic hip screw.  Distal interlocking cortical screw.  These traverse the intertrochanteric fracture.  Alignment appears near anatomic.  IMPRESSION:  ORIF left femur intertrochanteric fracture with no adverse features.  Original Report Authenticated By: Harley Hallmark, M.D.   Dg Chest Port 1 View  09/25/2011  *RADIOLOGY REPORT*  Clinical Data: Preop  PORTABLE CHEST - 1 VIEW  Comparison: 12/12/2010  Findings: Cardiomediastinal silhouette is stable.  There is kyphosis and dextroscoliosis of thoracic spine.  Diffuse osteopenia is noted.  Chronic fracture deformity noted bilateral proximal humerus.  No acute infiltrate or pulmonary edema.  Stable chronic interstitial prominence.  IMPRESSION: No active disease.  Stable hyperinflation and chronic interstitial prominence.  There is kyphosis and dextroscoliosis of thoracic spine.  Chronic fracture deformity bilateral proximal humerus .  Original Report Authenticated By: Natasha Mead, M.D.   Dg C-arm 61-120 Min-no Report  09/25/2011  CLINICAL DATA: surgery   C-ARM 61-120 MINUTES  Fluoroscopy was utilized by the requesting physician.  No  radiographic  interpretation.      Assessment/Plan: Diagnosis: left intertroch hip fracture s/p IMN. 1. Does the need for close, 24 hr/day medical supervision in concert with the patient's rehab needs make it unreasonable for this patient to be served in a less intensive setting? Potentially 2. Co-Morbidities requiring supervision/potential complications: htn, abla, pain 3. Due to bladder management, bowel management, safety, skin/wound care, disease management, medication administration, pain management and patient education, does the patient require 24 hr/day rehab nursing? Yes/potentially 4. Does the patient require coordinated care of a physician, rehab  nurse, PT (1-2 hrs/day, 5 days/week) and OT (1-2 hrs/day, 5 days/week) to address physical and functional deficits in the context of the above medical diagnosis(es)? Yes/potentially Addressing deficits in the following areas: balance, endurance, locomotion, strength, transferring, bowel/bladder control, bathing, dressing, feeding, grooming and toileting 5. Can the patient actively participate in an intensive therapy program of at least 3 hrs of therapy per day at least 5 days per week? Yes/potentially 6. The potential for patient to make measurable gains while on inpatient rehab is Fair to good 7. Anticipated functional outcomes upon discharge from inpatient rehab are supervision to occasional min assist with PT, supervision to min asssist with OT. 8. Estimated rehab length of stay to reach the above functional goals is: 2 weeks 9. Does the patient have adequate social supports to accommodate these discharge functional goals? YES 10. Anticipated D/C setting: eventually home 11. Anticipated post D/C treatments: HH therapy 12. Overall Rehab/Functional Prognosis: excellent  RECOMMENDATIONS: This patient's condition is appropriate for continued rehabilitative care in the following setting: CIR Patient has agreed to participate in recommended  program. Yes Note that insurance prior authorization may be required for reimbursement for recommended care.  Comment: Pt is rather frail, but willing to work. Fairly active at home.  I spoke with a son who promises they will provide the care she needs at home and believes that she can tolerate our rehab intensity. Rehab RN to follow up.   Ivory Broad, MD 09/27/2011

## 2011-09-27 NOTE — Progress Notes (Signed)
Per Rehab MD, patient would likely be a good CIR candidate. Discussed with patient who states she has been to CIR before and would like to go back. Discussed also with her son, who would be primary CG after short CIR stay. Will f/u in am for medical readiness. Thanks for consult. [name] [phone].

## 2011-09-28 ENCOUNTER — Inpatient Hospital Stay (HOSPITAL_COMMUNITY)
Admission: RE | Admit: 2011-09-28 | Discharge: 2011-10-13 | DRG: 945 | Disposition: A | Discharge status: Home Health | Payer: Medicare Other | Source: Ambulatory Visit | Attending: Physical Medicine & Rehabilitation | Admitting: Physical Medicine & Rehabilitation

## 2011-09-28 ENCOUNTER — Encounter (HOSPITAL_COMMUNITY): Payer: Self-pay | Admitting: *Deleted

## 2011-09-28 DIAGNOSIS — B961 Klebsiella pneumoniae [K. pneumoniae] as the cause of diseases classified elsewhere: Secondary | ICD-10-CM

## 2011-09-28 DIAGNOSIS — W19XXXA Unspecified fall, initial encounter: Secondary | ICD-10-CM

## 2011-09-28 DIAGNOSIS — E782 Mixed hyperlipidemia: Secondary | ICD-10-CM

## 2011-09-28 DIAGNOSIS — S72143A Displaced intertrochanteric fracture of unspecified femur, initial encounter for closed fracture: Secondary | ICD-10-CM | POA: Diagnosis present

## 2011-09-28 DIAGNOSIS — Z79899 Other long term (current) drug therapy: Secondary | ICD-10-CM

## 2011-09-28 DIAGNOSIS — I10 Essential (primary) hypertension: Secondary | ICD-10-CM

## 2011-09-28 DIAGNOSIS — Z5189 Encounter for other specified aftercare: Secondary | ICD-10-CM

## 2011-09-28 DIAGNOSIS — Z882 Allergy status to sulfonamides status: Secondary | ICD-10-CM

## 2011-09-28 DIAGNOSIS — S72142A Displaced intertrochanteric fracture of left femur, initial encounter for closed fracture: Secondary | ICD-10-CM

## 2011-09-28 DIAGNOSIS — Z9849 Cataract extraction status, unspecified eye: Secondary | ICD-10-CM

## 2011-09-28 DIAGNOSIS — K5900 Constipation, unspecified: Secondary | ICD-10-CM

## 2011-09-28 DIAGNOSIS — F411 Generalized anxiety disorder: Secondary | ICD-10-CM

## 2011-09-28 DIAGNOSIS — M129 Arthropathy, unspecified: Secondary | ICD-10-CM

## 2011-09-28 DIAGNOSIS — N390 Urinary tract infection, site not specified: Secondary | ICD-10-CM

## 2011-09-28 DIAGNOSIS — E039 Hypothyroidism, unspecified: Secondary | ICD-10-CM

## 2011-09-28 DIAGNOSIS — Z888 Allergy status to other drugs, medicaments and biological substances status: Secondary | ICD-10-CM

## 2011-09-28 DIAGNOSIS — E876 Hypokalemia: Secondary | ICD-10-CM

## 2011-09-28 DIAGNOSIS — D649 Anemia, unspecified: Secondary | ICD-10-CM

## 2011-09-28 DIAGNOSIS — E119 Type 2 diabetes mellitus without complications: Secondary | ICD-10-CM

## 2011-09-28 DIAGNOSIS — S72009A Fracture of unspecified part of neck of unspecified femur, initial encounter for closed fracture: Secondary | ICD-10-CM

## 2011-09-28 LAB — CBC
HCT: 26.9 % — ABNORMAL LOW (ref 36.0–46.0)
MCH: 29.4 pg (ref 26.0–34.0)
MCHC: 33.8 g/dL (ref 30.0–36.0)
MCV: 87.1 fL (ref 78.0–100.0)
RDW: 14.8 % (ref 11.5–15.5)

## 2011-09-28 LAB — POTASSIUM
Potassium: 4.4 mEq/L (ref 3.5–5.1)
Potassium: 4 mEq/L (ref 3.5–5.1)

## 2011-09-28 LAB — BASIC METABOLIC PANEL
BUN: 11 mg/dL (ref 6–23)
Creatinine, Ser: 0.67 mg/dL (ref 0.50–1.10)
GFR calc Af Amer: 85 mL/min — ABNORMAL LOW (ref >90)
GFR calc non Af Amer: 73 mL/min — ABNORMAL LOW (ref >90)

## 2011-09-28 MED ORDER — POTASSIUM CHLORIDE 10 MEQ/100ML IV SOLN
10.0000 meq | INTRAVENOUS | Status: AC
  Administered 2011-09-28 (×2): 10 meq via INTRAVENOUS
  Filled 2011-09-28 (×3): qty 100

## 2011-09-28 MED ORDER — METOPROLOL TARTRATE 100 MG PO TABS
100.0000 mg | ORAL_TABLET | Freq: Two times a day (BID) | ORAL | Status: DC
  Administered 2011-09-28 – 2011-10-13 (×30): 100 mg via ORAL
  Filled 2011-09-28 (×32): qty 1

## 2011-09-28 MED ORDER — BENAZEPRIL HCL 20 MG PO TABS
20.0000 mg | ORAL_TABLET | Freq: Every day | ORAL | Status: DC
  Filled 2011-09-28: qty 1

## 2011-09-28 MED ORDER — POTASSIUM CHLORIDE 20 MEQ/15ML (10%) PO LIQD
40.0000 meq | Freq: Once | ORAL | Status: AC
  Administered 2011-09-28: 40 meq via ORAL
  Filled 2011-09-28 (×2): qty 30

## 2011-09-28 MED ORDER — POTASSIUM CHLORIDE 20 MEQ/15ML (10%) PO LIQD
40.0000 meq | Freq: Every day | ORAL | Status: DC
Start: 2011-09-28 — End: 2011-09-28
  Filled 2011-09-28: qty 30

## 2011-09-28 MED ORDER — CIPROFLOXACIN HCL 500 MG PO TABS: 500.0000 mg | ORAL_TABLET | Freq: Two times a day (BID) | ORAL | Status: AC

## 2011-09-28 MED ORDER — DSS 100 MG PO CAPS: 100.0000 mg | ORAL_CAPSULE | Freq: Two times a day (BID) | ORAL | Status: AC

## 2011-09-28 MED ORDER — CIPROFLOXACIN HCL 500 MG PO TABS
500.0000 mg | ORAL_TABLET | Freq: Two times a day (BID) | ORAL | Status: DC
  Filled 2011-09-28 (×2): qty 1

## 2011-09-28 MED ORDER — ACETAMINOPHEN 325 MG PO TABS
325.0000 mg | ORAL_TABLET | ORAL | Status: DC | PRN
  Administered 2011-09-29 – 2011-10-09 (×4): 650 mg via ORAL
  Filled 2011-09-28 (×3): qty 2

## 2011-09-28 MED ORDER — METHOCARBAMOL 500 MG PO TABS: 500.0000 mg | ORAL_TABLET | Freq: Four times a day (QID) | ORAL | Status: DC | PRN

## 2011-09-28 MED ORDER — ASPIRIN EC 81 MG PO TBEC
81.0000 mg | DELAYED_RELEASE_TABLET | Freq: Every day | ORAL | Status: DC
  Administered 2011-09-29 – 2011-10-13 (×15): 81 mg via ORAL
  Filled 2011-09-28 (×18): qty 1

## 2011-09-28 MED ORDER — SENNOSIDES-DOCUSATE SODIUM 8.6-50 MG PO TABS
1.0000 | ORAL_TABLET | Freq: Every evening | ORAL | Status: DC | PRN
  Administered 2011-09-30: 1 via ORAL
  Filled 2011-09-28: qty 1

## 2011-09-28 MED ORDER — ONDANSETRON HCL 4 MG/2ML IJ SOLN: 4.0000 mg | Freq: Four times a day (QID) | INTRAMUSCULAR | Status: DC | PRN

## 2011-09-28 MED ORDER — TRAMADOL HCL 50 MG PO TABS
50.0000 mg | ORAL_TABLET | Freq: Four times a day (QID) | ORAL | Status: DC | PRN
  Administered 2011-09-28 – 2011-10-12 (×21): 50 mg via ORAL
  Filled 2011-09-28 (×21): qty 1

## 2011-09-28 MED ORDER — BISACODYL 5 MG PO TBEC: 5.0000 mg | DELAYED_RELEASE_TABLET | Freq: Every day | ORAL | Status: DC | PRN

## 2011-09-28 MED ORDER — ENOXAPARIN SODIUM 40 MG/0.4ML ~~LOC~~ SOLN
40.0000 mg | SUBCUTANEOUS | Status: DC
  Administered 2011-09-29 – 2011-10-12 (×14): 40 mg via SUBCUTANEOUS
  Filled 2011-09-28 (×16): qty 0.4

## 2011-09-28 MED ORDER — METHOCARBAMOL 500 MG PO TABS
500.0000 mg | ORAL_TABLET | Freq: Four times a day (QID) | ORAL | Status: DC | PRN
  Administered 2011-10-01 – 2011-10-08 (×2): 500 mg via ORAL
  Filled 2011-09-28 (×2): qty 1

## 2011-09-28 MED ORDER — MAGNESIUM SULFATE 40 MG/ML IJ SOLN
2.0000 g | Freq: Once | INTRAMUSCULAR | Status: DC
  Filled 2011-09-28: qty 50

## 2011-09-28 MED ORDER — SORBITOL 70 % SOLN
30.0000 mL | Freq: Every day | Status: DC | PRN
  Administered 2011-09-30: 30 mL via ORAL
  Filled 2011-09-28: qty 30

## 2011-09-28 MED ORDER — ONDANSETRON HCL 4 MG PO TABS: 4.0000 mg | ORAL_TABLET | Freq: Four times a day (QID) | ORAL | Status: DC | PRN

## 2011-09-28 MED ORDER — AMLODIPINE BESYLATE 5 MG PO TABS
5.0000 mg | ORAL_TABLET | Freq: Every day | ORAL | Status: DC
  Administered 2011-09-29 – 2011-10-13 (×15): 5 mg via ORAL
  Filled 2011-09-28 (×16): qty 1

## 2011-09-28 MED ORDER — CIPROFLOXACIN HCL 500 MG PO TABS
500.0000 mg | ORAL_TABLET | Freq: Two times a day (BID) | ORAL | Status: DC
  Administered 2011-09-28: 500 mg via ORAL
  Filled 2011-09-28 (×4): qty 1

## 2011-09-28 MED ORDER — ALUM & MAG HYDROXIDE-SIMETH 200-200-20 MG/5ML PO SUSP
30.0000 mL | ORAL | Status: DC | PRN
  Administered 2011-10-08: 30 mL via ORAL
  Filled 2011-09-28: qty 30

## 2011-09-28 MED ORDER — CIPROFLOXACIN HCL 250 MG PO TABS
250.0000 mg | ORAL_TABLET | Freq: Two times a day (BID) | ORAL | Status: DC
  Administered 2011-09-28 – 2011-10-04 (×12): 250 mg via ORAL
  Filled 2011-09-28 (×14): qty 1

## 2011-09-28 MED ORDER — POTASSIUM CHLORIDE CRYS ER 20 MEQ PO TBCR
40.0000 meq | EXTENDED_RELEASE_TABLET | Freq: Once | ORAL | Status: AC
  Administered 2011-09-28: 40 meq via ORAL
  Filled 2011-09-28 (×2): qty 2

## 2011-09-28 MED ORDER — ENOXAPARIN SODIUM 40 MG/0.4ML ~~LOC~~ SOLN: 40.0000 mg | SUBCUTANEOUS | Status: DC

## 2011-09-28 MED ORDER — CIPROFLOXACIN HCL 250 MG PO TABS: 250.0000 mg | ORAL_TABLET | Freq: Two times a day (BID) | ORAL | Status: DC

## 2011-09-28 MED ORDER — HYDROCHLOROTHIAZIDE 12.5 MG PO CAPS
12.5000 mg | ORAL_CAPSULE | Freq: Every day | ORAL | Status: DC
  Filled 2011-09-28: qty 1

## 2011-09-28 MED ORDER — LEVOTHYROXINE SODIUM 100 MCG PO TABS
100.0000 ug | ORAL_TABLET | Freq: Every day | ORAL | Status: DC
  Administered 2011-09-29 – 2011-10-13 (×15): 100 ug via ORAL
  Filled 2011-09-28 (×16): qty 1

## 2011-09-28 NOTE — Progress Notes (Signed)
Occupational Therapy Treatment Patient Details Name: Misty Shah MRN: [id number] DOB: 1916/04/21 Today's Date: 09/28/2011 Time: 4098-1191 OT Time Calculation (min): 22 min  OT Assessment / Plan / Recommendation Comments on Treatment Session Pt initially unsteady but balance improved as tx progressed.      Follow Up Recommendations  Inpatient Rehab    Equipment Recommendations  Defer to next venue    Frequency Min 2X/week   Plan      Precautions / Restrictions Precautions Precautions: Fall Restrictions Weight Bearing Restrictions: Yes LLE Weight Bearing: Weight bearing as tolerated   Pertinent Vitals/Pain 0 in bed and 5/10 L hip with weight bearing    ADL  Toilet Transfer: Performed;Other (comment) (min to mod A for steps due to LOB; max A to stand from bed) Toilet Transfer Method: Ambulating (3 steps) Toilet Transfer Equipment: Bedside commode Toileting - Hygiene: Performed;Moderate assistance;Other (comment) (pt unable to release hand in standing:  assists from seated) Where Assessed - Toileting Hygiene: Sit to stand from 3-in-1 or toilet Ambulation Related to ADLs: pt initially unsteady:  required A to sit controlled onto 3:1 secondary to LOB.  Initially leaning posteriorly during hygiene but improved.   ADL Comments: pt did not want to do full adl this am, but she had urgency so cleaned up as needed.  Able to assist with hygiene from sitting but not from standing    OT Goals Acute Rehab OT Goals Time For Goal Achievement: 10/04/11 ADL Goals Pt Will Transfer to Toilet: with supervision;Ambulation;3-in-1;Other (comment) ADL Goal: Toilet Transfer - Progress: Progressing toward goals Pt Will Perform Toileting - Hygiene: with supervision;Standing at 3-in-1/toilet ADL Goal: Toileting - Hygiene - Progress: Progressing toward goals  Visit Information  Last OT Received On: 09/28/11    Subjective Data      Prior Functioning       Cognition  Overall Cognitive Status:  Appears within functional limits for tasks assessed/performed    Mobility Bed Mobility Supine to Sit: 2: Max assist (pt 40%) Sitting - Scoot to Edge of Bed: 2: Max assist Transfers Sit to Stand: 3: Mod assist;2: Max assist;Other (comment) (max A from bed; mod A from 3:1) Details for Transfer Assistance: vcs for safety and hand placement   Exercises    Balance    End of Session OT - End of Session Activity Tolerance: Patient tolerated treatment well Patient left: Other (comment) (PT took over after toilet hygiene)   Lacrisha Bielicki 09/28/2011, 10:39 AM Marica Otter, OTR/L [phone] 09/28/2011

## 2011-09-28 NOTE — Progress Notes (Signed)
On call MD made aware that pt's potassium is 2.7.

## 2011-09-28 NOTE — Discharge Summary (Addendum)
Misty Shah MRN: [id number] DOB/AGE: [birth year] 76 y.o.  Admit date: 09/25/2011 Discharge date: 09/28/2011  Primary Care Physician:  Michiel Sites, MD, MD   Discharge Diagnoses:   Patient Active Problem List  Diagnoses  . Hip fracture  . Hypertension  . Osteoporosis  . Hypothyroidism  . UTI (lower urinary tract infection)    DISCHARGE MEDICATION: Medication List  As of 09/28/2011  4:48 PM   STOP taking these medications         benazepril-hydrochlorthiazide 20-12.5 MG per tablet      celecoxib 200 MG capsule      diazepam 2 MG tablet      naproxen 375 MG tablet         TAKE these medications         amLODipine 5 MG tablet   Commonly known as: NORVASC   Take 5 mg by mouth daily.      aspirin EC 81 MG tablet   Take 81 mg by mouth daily.      bisacodyl 5 MG EC tablet   Commonly known as: DULCOLAX   Take 1 tablet (5 mg total) by mouth daily as needed.      ciprofloxacin 500 MG tablet   Commonly known as: CIPRO   Take 1 tablet (500 mg total) by mouth 2 (two) times daily.      DSS 100 MG Caps   Take 100 mg by mouth 2 (two) times daily.      enoxaparin 40 MG/0.4ML injection   Commonly known as: LOVENOX   Inject 0.4 mLs (40 mg total) into the skin daily.      levothyroxine 100 MCG tablet   Commonly known as: SYNTHROID, LEVOTHROID   Take 100 mcg by mouth daily.      methocarbamol 500 MG tablet   Commonly known as: ROBAXIN   Take 1 tablet (500 mg total) by mouth every 6 (six) hours as needed.      metoprolol 100 MG tablet   Commonly known as: LOPRESSOR   Take 100 mg by mouth 2 (two) times daily.      traMADol 50 MG tablet   Commonly known as: ULTRAM   Take 50 mg by mouth every 6 (six) hours as needed. For pain relief              Consults: Treatment Team:  Valeria Batman, MD   SIGNIFICANT DIAGNOSTIC STUDIES:  Dg Hip Complete Left  09/25/2011  *RADIOLOGY REPORT*  Clinical Data: Fall.  Left hip pain.  LEFT HIP - COMPLETE 2+ VIEW   Comparison: None.  Findings: Intertrochanteric left femur fracture with mild varus deformity is noted.  Mild displacement of the lesser trochanter. Hardware is present in the proximal right femur with subtrochanteric deformity.  No breakage or loosening of the hardware.  Osteopenia.  IMPRESSION: Acute intertrochanteric left femur fracture. Per CMS PQRS reporting requirements (PQRS Measure 24): Given the patient's age of greater than 50 and the fracture site (hip, distal radius, or spine), the patient should be tested for osteoporosis using DXA, and the appropriate treatment considered based on the DXA results.  Original Report Authenticated By: Donavan Burnet, M.D.   Dg Femur Left  09/26/2011  *RADIOLOGY REPORT*  Clinical Data:  78 year old female undergoing left hip ORIF.    DG C-ARM 61-120 MIN - [facility]PT [facility], LEFT FEMUR - 2 VIEW  Technique:  five intraoperative fluoroscopic views of the left femur.  Fluoroscopy time of 1.0 minutes was utilized.  Comparison:  preoperative study 09/25/2011.  Findings:  left femur intramedullary rod in place.  Proximal interlocking dynamic hip screw.  Distal interlocking cortical screw.  These traverse the intertrochanteric fracture.  Alignment appears near anatomic.  IMPRESSION:  ORIF left femur intertrochanteric fracture with no adverse features.  Original Report Authenticated By: Harley Hallmark, M.D.   Dg Chest Port 1 View  09/25/2011  *RADIOLOGY REPORT*  Clinical Data: Preop  PORTABLE CHEST - 1 VIEW  Comparison: 12/12/2010  Findings: Cardiomediastinal silhouette is stable.  There is kyphosis and dextroscoliosis of thoracic spine.  Diffuse osteopenia is noted.  Chronic fracture deformity noted bilateral proximal humerus.  No acute infiltrate or pulmonary edema.  Stable chronic interstitial prominence.  IMPRESSION: No active disease.  Stable hyperinflation and chronic interstitial prominence.  There is kyphosis and dextroscoliosis of thoracic spine.  Chronic fracture  deformity bilateral proximal humerus .  Original Report Authenticated By: Natasha Mead, M.D.   Dg C-arm 61-120 Min-no Report  09/25/2011  CLINICAL DATA: surgery   C-ARM 61-120 MINUTES  Fluoroscopy was utilized by the requesting physician.  No radiographic  interpretation.           Recent Results (from the past 240 hour(s))  URINE CULTURE     Status: Normal   Collection Time   09/25/11  1:00 PM      Component Value Range Status Comment   Specimen Description URINE, CATHETERIZED   Final    Special Requests NONE   Final    Culture  Setup Time [id number]   Final    Colony Count >=100,000 COLONIES/ML   Final    Culture KLEBSIELLA PNEUMONIAE   Final    Report Status 09/27/2011 FINAL   Final    Organism ID, Bacteria KLEBSIELLA PNEUMONIAE   Final     BRIEF ADMITTING H & P: 78 year-old female presents with a mechanical fall. She denies any chest pain palpitations lightheadedness prior to the fall. She states her car was rolled over next 2 the fireplace, and she tried to bend over and fix it. She denies any previous cardiac history. She has had multiple falls in the past, and all of them have been mechanical falls . She ambulates in the house without a walker at baseline and is fairly independent. She denies any urinary urgency frequency or dysuria but is found to have a urinary tract infection during this admission    Hospital Course:  Present on Admission:  UTI (lower urinary tract infection) (09/25/2011) Assessment: Urine culture shows Klebsiella pneumonia sensitive to Rocephin. Patient on day #2 of Rocephin. We'll continue for a total of 7 days. Please pathogen also sensitive to the ciprofloxacin which can be used as an option for outpatient treatment   Hip fracture (09/25/2011)  Patient is postop day#3 of ORIF of the left femur. Patient has started working with physical therapy weightbearing as tolerated on LLE and the plan is to go to inpatient rehabilitation for further therapy before  going home. Dr Hoy Register office will call to schedule appointment.  Hypertension (09/25/2011)  Blood pressure adequately control during this hospitalization  Osteoporosis (09/25/2011)  Patient has been started on calcium and vitamin D   Hypothyroidism (09/25/2011)  A normal TSH of 1.806 shows adequate replacement. Pre hospital dose of Synthroid was continued during this hospitalization.  Hypokalemia (09/28/2011) Pt had mild to moderate hypokalemia which was replaced both orally and by IV.  A repeat potassium is pending level is pending and will be followed up by  Dr.  Riley Kill at [facility].  Hypomagnesemia (09/28/2011) Pt had a mild hypomagnesemia at 1.4. This is to be replaced by IV 2G IVPB when patient arrives at rehab as discussed with Rehab. I recommend that electrolytes and Mg level be rechecked in the morning.   Disposition and Follow-up:  Pt being discharged to In-patient rehab.   DISCHARGE EXAM:  General: Alert, awake, oriented x3, in no acute distress.  Vital Signs:Blood pressure 135/76, pulse 83, temperature 99.5 F (37.5 C), temperature source Oral, resp. rate 18, height 5\' 1"  (1.549 m), weight 54.432 kg (120 lb), SpO2 91.00%. HEENT: /AT PEERL, EOMI  Neck: Trachea midline, no masses, no thyromegal,y no JVD, no carotid bruit  OROPHARYNX: Moist, No exudate/ erythema/lesions.  Heart: Regular rate and rhythm, without murmurs, rubs, gallops, PMI non-displaced, no heaves or thrills on palpation.  Lungs: Clear to auscultation, no wheezing or rhonchi noted. No increased vocal fremitus resonant to percussion  Abdomen: Soft, nontender, nondistended, positive bowel sounds, no masses no hepatosplenomegaly noted..  Neuro: No focal neurological deficits noted cranial nerves II through XII grossly intact. DTRs 2+ bilaterally upper and lower extremities. Strength 5 out of 5 in bilateral upper and lower extremities.  Musculoskeletal: No warm swelling or erythema around joints, no spinal tenderness  noted.  Psychiatric: Patient alert and oriented x3, good insight and cognition, good recent to remote recall.  Lymph node survey: No cervical axillary or inguinal lymphadenopathy noted.     Basename 09/28/11 0455 09/27/11 0456 09/25/11 1720  NA 133* 132* --  K 2.7* 3.3* --  CL 97 98 --  CO2 29 26 --  GLUCOSE 116* 136* --  BUN 11 18 --  CREATININE 0.67 0.79 --  CALCIUM 7.6* 7.6* --  MG 1.4* -- 1.3*  PHOS -- -- --    Basename 09/26/11 0515  AST 24  ALT 8  ALKPHOS 40  BILITOT 0.3  PROT 5.0*  ALBUMIN 2.7*   No results found for this basename: LIPASE:2,AMYLASE:2 in the last 72 hours  Basename 09/28/11 0455 09/27/11 0456  WBC 11.2* 10.5  NEUTROABS -- --  HGB 9.1* 10.8*  HCT 26.9* 31.4*  MCV 87.1 86.0  PLT 99* 96*    Total time for discharge process including face to face time approximately 45 minutes Signed: Durell Lofaso A. 09/28/2011, 4:48 PM

## 2011-09-28 NOTE — Progress Notes (Signed)
Patient ID: ONIE KASPAREK, female   DOB: 1915-09-26, 78 y.o.   MRN: [id number] PATIENT ID: JOSCELYN HARDRICK        MRN:  [id number]          DOB/AGE: [birth year] / 78 y.o.  Norlene Campbell, MD   Jacqualine Code, PA-C [address] [place], [place], [place]  [address]                             [phone]   PROGRESS NOTE  Subjective:  negative for Chest Pain  negative for Shortness of Breath  negative for Nausea/Vomiting   Mild pain left calf,but Homan's neg   Tolerating Diet: yes         Patient reports pain as mild.    Objective: Vital signs in last 24 hours:   Patient Vitals for the past 24 hrs:  BP Temp Temp src Pulse Resp SpO2  09/28/11 1600 - - - - 18  94 %  09/28/11 1320 135/76 mmHg 99.5 F (37.5 C) Oral 83  18  91 %  09/28/11 1200 - - - - 18  93 %  09/28/11 1025 119/68 mmHg 98.6 F (37 C) Oral 93  16  97 %  09/28/11 0742 - - - - 18  96 %  09/28/11 0639 148/67 mmHg 98.7 F (37.1 C) Oral 93  17  98 %  09/28/11 0223 134/56 mmHg 98.2 F (36.8 C) Oral 80  18  97 %  09/27/11 2218 123/69 mmHg 99.4 F (37.4 C) Oral 87  18  98 %  09/27/11 1812 133/56 mmHg 98 F (36.7 C) Oral 80  20  99 %      Intake/Output from previous day:   05/01 0701 - 05/02 0700 In: 240 [P.O.:240] Out: -    Intake/Output this shift:   05/02 0701 - 05/02 1900 In: 480 [P.O.:480] Out: -    Intake/Output      05/01 0701 - 05/02 0700 05/02 0701 - 05/03 0700   P.O. 240 480   I.V. (mL/kg)     Blood     Total Intake(mL/kg) 240 (4.4) 480 (8.8)   Urine (mL/kg/hr)     Total Output     Net +240 +480        Urine Occurrence 2 x 3 x      LABORATORY DATA:  Basename 09/28/11 0455 09/27/11 0456 09/26/11 0515 09/25/11 2242 09/25/11 1720 09/25/11 1305  WBC 11.2* 10.5 10.9* 12.0* 11.4* 17.0*  HGB 9.1* 10.8* 7.7* 9.4* 10.8* 11.9*  HCT 26.9* 31.4* 22.9* 27.4* 31.1* 34.8*  PLT 99* 96* 125* 142* 152 178    Basename 09/28/11 0455 09/27/11 0456 09/26/11 0515 09/25/11 1305  NA 133* 132* 133* 131*  K 2.7* 3.3*  4.0 4.0  CL 97 98 98 92*  CO2 29 26 24 26   BUN 11 18 17 20   CREATININE 0.67 0.79 0.72 0.73  GLUCOSE 116* 136* 118* 171*  CALCIUM 7.6* 7.6* 7.8* 9.5   Lab Results  Component Value Date   INR 1.08 09/25/2011    Examination:  General appearance: alert, cooperative and no distress  Wound Exam: clean, dry, intact   Drainage:  None: wound tissue dry  Motor Exam: EHL, FHL, Anterior Tibial and Posterior Tibial Intact  Sensory Exam: Superficial Peroneal, Deep Peroneal and Tibial normal  Vascular Exam: Normal  Assessment:    3 Days Post-Op  Procedure(s) (LRB): INTRAMEDULLARY (IM) NAIL FEMORAL (  Left)  ADDITIONAL DIAGNOSIS:  Principal Problem:  *Hip fracture Active Problems:  Hypertension  Osteoporosis  Hypothyroidism  UTI (lower urinary tract infection)  Acute Blood Loss Anemia   Plan: Physical Therapy as ordered Weight Bearing as Tolerated (WBAT)  DVT Prophylaxis:  Lovenox  DISCHARGE PLAN: Inpatient Rehab -to [facility] today  DISCHARGE NEEDS: HHPT and Walker   wounds look fine,to [facility] rehab today- will see in 10-14 days      Tyona Nilsen W 09/28/2011, 5:11 PM

## 2011-09-28 NOTE — Progress Notes (Signed)
Patient discharged to CIR in stable condition.  2 runs of K+ given and lab drawn before leaving.  Transferred by Care Crisoforo Oxford

## 2011-09-28 NOTE — Progress Notes (Signed)
Physical Therapy Treatment Patient Details Name: Misty Shah MRN: [id number] DOB: August 13, 1915 Today's Date: 09/28/2011 Time: 1000-1010 PT Time Calculation (min): 10 min  PT Assessment / Plan / Recommendation Comments on Treatment Session  Pt continuing to improve slowly. Pt continues to participate well.     Follow Up Recommendations  Inpatient Rehab;Skilled nursing facility    Equipment Recommendations  Defer to next venue    Frequency Min 5X/week   Plan Discharge plan remains appropriate    Precautions / Restrictions Precautions Precautions: Fall Restrictions Weight Bearing Restrictions: No LLE Weight Bearing: Weight bearing as tolerated   Pertinent Vitals/Pain     Mobility  Transfers: Sit to Stand;Stand to Sit Sit to Stand: 3: Mod assist;With armrests;From chair/3-in-1;With upper extremity assist Stand to Sit: With upper extremity assist;With armrests;To chair/3-in-1;3: Mod assist Details for Transfer Assistance: VCs safety, technique, hand placement. Assist to rise, stabilize, control descent.  Ambulation/Gait Ambulation/Gait Assistance: 4: Min assist Ambulation Distance (Feet): 15 Feet Assistive device: Rolling walker Ambulation/Gait Assistance Details: VCs safety, posture, sequence, technique. Encouraged increased use of UEs to assist with WBing. Increased time. Assist to stabilize and navigate with RW. Followed with recliner. Gait Pattern: Step-to pattern;Decreased stance time - left;Antalgic;Decreased stride length;Decreased step length - right;Decreased step length - left;Trunk flexed    Exercises     PT Goals Acute Rehab PT Goals PT Goal: Sit to Stand - Progress: Progressing toward goal PT Goal: Stand to Sit - Progress: Progressing toward goal PT Goal: Ambulate - Progress: Progressing toward goal  Visit Information  Last PT Received On: 09/28/11 Assistance Needed: +1    Subjective Data  Subjective: "Im okay" Patient Stated Goal: Rehab   Cognition  Overall Cognitive Status: Appears within functional limits for tasks assessed/performed Arousal/Alertness: Awake/alert Orientation Level: Appears intact for tasks assessed Behavior During Session: [facility] for tasks performed    Balance     End of Session PT - End of Session Equipment Utilized During Treatment: Gait belt Activity Tolerance: Patient tolerated treatment well;Patient limited by pain Patient left: in chair;with call bell/phone within reach;with nursing in room    Rebeca Alert [facility] 09/28/2011, 11:43 AM [phone]

## 2011-09-28 NOTE — PMR Pre-admission (Signed)
PMR Admission Coordinator Pre-Admission Assessment  Patient: Misty Shah is an 78 y.o., female MRN: [id number] DOB: 1916-02-23 Height: 5\' 1"  (154.9 cm) Weight: 54.432 kg (120 lb)  Insurance Information  PRIMARY: Medicare      Policy#: [id number]      Subscriber: patient CM Name:       Phone#:      Fax#:  Pre-Cert#:       Employer: retired Benefits:  Phone #: [employer]     Name: Eff. Date: 01/28/83 A and B     Deduct: $1184      Out of Pocket Max: -      Life Max: unlim CIR: 100%      SNF: LBD=11/15/01, 100 days Outpatient: 80%     Co-Pay: 20% Home Health: 100%      Co-Pay: 0% DME: 80%     Co-Pay: 20% Providers: patient's choice SECONDARY: [employer]      Policy#: [id number]      Subscriber: patient CM Name:       Phone#:      Fax#:  Pre-Cert#:       Employer:  Benefits:  Phone #:      Name:  Eff. Date:      Deduct:       Out of Pocket Max:       Life Max:  CIR:       SNF:  Outpatient:      Co-Pay:  Home Health:       Co-Pay:  DME:      Co-Pay:   Medicaid Application Date:       Case Manager:  Disability Application Date:       Case Worker:   Emergency Contact Information Contact Information    Name Relation Home Work [employer] [phone]  [phone]     Current Medical History  Patient Admitting Diagnosis: Left intertrochanteric hip fracture s/p IM nailing History of Present Illness: 78 year old right-handed white female admitted 09/25/2011 after a fall. Patient lives alone and uses a walker at times. Patient with multiple falls in the past. Sustained a left displaced intertrochanteric hip fracture. Underwent closed reduction and internal fixation with intramedullary nail 09/25/2011 per Dr. Cleophas Dunker. Weightbearing as tolerated and placed on subcutaneous Lovenox for deep vein thrombosis prophylaxis. Postoperative anemia 7.7 she was transfused with followup hemoglobin 9.1. Postoperative hypokalemia 2.7 09/28/11 KCl 40 mEq given 09/28/11. Klebsiella urinary tract  infection with IV Rocephin added and plan to change to by mouth antibiotics. Physical therapy evaluation completed 09/26/2011 with recommendations for physical medicine rehabilitation consult to consider inpatient rehabilitation services       Past Medical History  Past Medical History  Diagnosis Date  . Hypertension   . Arthritis   . Hypothyroidism   . UTI (lower urinary tract infection)   . Anxiety   . Claustrophobia   . Dislocated shoulder     hx bilateral shoulders - had therapy on them  . Shingles     Family History  family history is not on file.  Prior Rehab/Hospitalizations: Prior CIR stay for right hip fracture   Current Medications  Current facility-administered medications:acetaminophen (TYLENOL) suppository 650 mg, 650 mg, Rectal, Q6H PRN, Valeria Batman, MD;  acetaminophen (TYLENOL) tablet 650 mg, 650 mg, Oral, Q6H PRN, Valeria Batman, MD;  alum & mag hydroxide-simeth (MAALOX/MYLANTA) 200-200-20 MG/5ML suspension 30 mL, 30 mL, Oral, Q4H PRN, Roma Kayser Schorr, NP, 30 mL at 09/26/11 0529 amLODipine (NORVASC) tablet 5 mg, 5  mg, Oral, Daily, Valeria Batman, MD, 5 mg at 09/27/11 1020;  benazepril (LOTENSIN) tablet 20 mg, 20 mg, Oral, Daily, Richarda Overlie, MD, 20 mg at 09/27/11 1019;  bisacodyl (DULCOLAX) EC tablet 5 mg, 5 mg, Oral, Daily PRN, Valeria Batman, MD;  ciprofloxacin (CIPRO) tablet 500 mg, 500 mg, Oral, BID, Altha Harm, MD docusate sodium (COLACE) capsule 100 mg, 100 mg, Oral, BID, Valeria Batman, MD, 100 mg at 09/27/11 2200;  enoxaparin (LOVENOX) injection 40 mg, 40 mg, Subcutaneous, Q24H, Valeria Batman, MD, 40 mg at 09/27/11 1018;  hydrochlorothiazide (MICROZIDE) capsule 12.5 mg, 12.5 mg, Oral, Daily, Richarda Overlie, MD, 12.5 mg at 09/27/11 1020 HYDROmorphone (DILAUDID) injection 0.5 mg, 0.5 mg, Intravenous, Q2H PRN, Valeria Batman, MD, 0.5 mg at 09/28/11 0919;  levalbuterol (XOPENEX) nebulizer solution 0.63 mg, 0.63 mg, Nebulization, Q4H  PRN, Richarda Overlie, MD;  levothyroxine (SYNTHROID, LEVOTHROID) tablet 100 mcg, 100 mcg, Oral, QAC breakfast, Valeria Batman, MD, 100 mcg at 09/28/11 4782;  menthol-cetylpyridinium (CEPACOL) lozenge 3 mg, 1 lozenge, Oral, PRN, Valeria Batman, MD methocarbamol (ROBAXIN) 500 mg in dextrose 5 % 50 mL IVPB, 500 mg, Intravenous, Q6H PRN, Valeria Batman, MD;  methocarbamol (ROBAXIN) tablet 500 mg, 500 mg, Oral, Q6H PRN, Valeria Batman, MD;  metoCLOPramide [facility]) injection 5-10 mg, 5-10 mg, Intravenous, Q8H PRN, Valeria Batman, MD;  metoCLOPramide [facility]) tablet 5-10 mg, 5-10 mg, Oral, Q8H PRN, Valeria Batman, MD metoprolol (LOPRESSOR) tablet 100 mg, 100 mg, Oral, BID, Valeria Batman, MD, 100 mg at 09/27/11 2200;  ondansetron [facility]) injection 4 mg, 4 mg, Intravenous, Q6H PRN, Valeria Batman, MD, 4 mg at 09/27/11 1437;  ondansetron [facility]) tablet 4 mg, 4 mg, Oral, Q6H PRN, Valeria Batman, MD;  phenol [facility]) mouth spray 1 spray, 1 spray, Mouth/Throat, PRN, Valeria Batman, MD, 1 spray at 09/26/11 0430 potassium chloride 10 mEq in 100 mL IVPB, 10 mEq, Intravenous, Q1 Hr x 2, Altha Harm, MD;  potassium chloride 20 MEQ/15ML (10%) liquid 40 mEq, 40 mEq, Oral, Once, Leanne Chang, NP, 40 mEq at 09/28/11 0656;  potassium chloride SA (K-DUR,KLOR-CON) CR tablet 40 mEq, 40 mEq, Oral, Once, Leanne Chang, NP;  traMADol [facility]) tablet 50 mg, 50 mg, Oral, Q6H PRN, Valeria Batman, MD DISCONTD: 0.9 %  sodium chloride infusion, , Intravenous, Continuous, Valeria Batman, MD, Last Rate: 75 mL/hr at 09/28/11 9562;  DISCONTD: cefTRIAXone (ROCEPHIN) 1 g in dextrose 5 % 50 mL IVPB, 1 g, Intravenous, Q24H, Richarda Overlie, MD, 1 g at 09/27/11 1619;  DISCONTD: potassium chloride 20 MEQ/15ML (10%) liquid 40 mEq, 40 mEq, Oral, Daily, Leanne Chang, NP Facility-Administered Medications Ordered in Other Encounters: DISCONTD: ceFAZolin (ANCEF) IVPB 1 g/50 mL premix, , , PRN,  Paris Lore, CRNA, 1 g at 09/25/11 2000  Patients Current Diet: General  Precautions / Restrictions Precautions Precautions: Fall Restrictions Weight Bearing Restrictions: Yes LLE Weight Bearing: Weight bearing as tolerated   Prior Activity Level Community (5-7x/wk): patient active in [employer] / Equipment Home Assistive Devices/Equipment: Other (Comment) (raised toilet seat) Home Adaptive Equipment: Grab bars in shower;Hand-held shower hose;Shower chair with back;Walker - rolling;Straight cane;Bedside commode/3-in-1  Prior Functional Level Prior Function Level of Independence: Independent Needs Assistance:  (does light housework; son does Presenter, broadcasting and gets food) Artist:  (needs assist with heavy housework like vacuuming and moping.) Able to Take Stairs?: No (not without assistance from family) Driving: No Vocation: Retired  Current Functional Level Cognition  Arousal/Alertness: Awake/alert Overall Cognitive Status: Appears within functional limits for tasks assessed/performed Orientation Level: Oriented X4 Cognition - Other Comments: cognition not formally assessed, but history and conversation flow was normal and confirmed by family.      Sensation       Coordination       ADLs  Eating/Feeding: Simulated;Independent Where Assessed - Eating/Feeding: Chair Grooming: Simulated;Set up Where Assessed - Grooming: Unsupported sitting Upper Body Bathing: Simulated;Set up Where Assessed - Upper Body Bathing: Unsupported;Sitting, chair Lower Body Bathing: Performed;Moderate assistance Where Assessed - Lower Body Bathing: Sit to stand from chair Upper Body Dressing: Simulated;Minimal assistance;Other (comment) (iv) Where Assessed - Upper Body Dressing: Sitting, chair;Unsupported Lower Body Dressing: Simulated;Maximal assistance Where Assessed - Lower Body Dressing: Sit to stand from chair Toilet Transfer: Performed;Other (comment)  (min to mod A for steps due to LOB; max A to stand from bed) Toilet Transfer Method: Ambulating (3 steps) Toilet Transfer Equipment: Bedside commode Toileting - Clothing Manipulation: Simulated;Minimal assistance Where Assessed - Toileting Clothing Manipulation: Sit to stand from 3-in-1 or toilet Toileting - Hygiene: Performed;Moderate assistance;Other (comment) (pt unable to release hand in standing:  assists from seated) Where Assessed - Toileting Hygiene: Sit to stand from 3-in-1 or toilet Tub/Shower Transfer: Not assessed Equipment Used: Reacher Ambulation Related to ADLs: pt initially unsteady:  required A to sit controlled onto 3:1 secondary to LOB.  Initially leaning posteriorly during hygiene but improved.   ADL Comments: pt did not want to do full adl this am, but she had urgency so cleaned up as needed.  Able to assist with hygiene from sitting but not from standing    Mobility  Bed Mobility: Sit to Supine Supine to Sit: 2: Max assist (pt 40%) Supine to Sit: Patient Percentage: 50% Sitting - Scoot to Edge of Bed: 2: Max assist Sit to Supine: 2: Max assist Sit to Supine: Patient Percentage: 30% Scooting to HOB: 1: +2 Total assist Scooting to [facility]: Patient Percentage:  (0%)    Transfers  Transfers: Sit to Stand;Stand to Sit Sit to Stand: 3: Mod assist;2: Max assist;Other (comment) (max A from bed; mod A from 3:1) Sit to Stand: Patient Percentage: 50% Stand to Sit: 3: Mod assist;To chair/3-in-1;To bed;With armrests;With upper extremity assist (sat prematurely when getting back to bed) Stand to Sit: Patient Percentage: 50% Stand Pivot Transfers: 1: +2 Total assist Stand Pivot Transfers: Patient Percentage: 60%    Ambulation / Gait / Stairs / Wheelchair Mobility  Ambulation/Gait Ambulation/Gait Assistance:  (not attempted today due to BP and low blood levels) Ambulation Distance (Feet): 7 Feet (x 2. in room.) Assistive device: Rolling walker Ambulation/Gait Assistance Details:  VCs safety, technique, sequence, posture. Assist to stabilze and navigate with RW. Difficulty WBing on L LE. Increased time.  Gait Pattern: Step-to pattern;Trunk flexed;Antalgic;Decreased stride length;Decreased step length - right;Decreased step length - left    Posture / Balance Static Sitting Balance Static Sitting - Balance Support: Right upper extremity supported;Left upper extremity supported;Feet supported Static Sitting - Level of Assistance: 7: Independent Static Sitting - Comment/# of Minutes: 8 mins.  Upon first sitting min assist to prevent LOB posteriorly and patient was leaning right off of painful left leg.  Once positioned a little better with hips squared up to the EOB the patient was able to get to supervision level for ~5 mins.      Previous Home Environment Living Arrangements: Alone Lives With: Alone (family will come to stay with her) Type of  Home: House Home Layout: Two level;Able to live on main level with bedroom/bathroom Alternate Level Stairs-Rails:  (does not use per son) Home Access: Other (comment) (step up) Entrance Stairs-Rails: None Entrance Stairs-Number of Steps: 1 Bathroom Shower/Tub: Tub/shower unit;Curtain (hand held Teaching laboratory technician) Bathroom Toilet: Standard (with 3 in 1) Bathroom Accessibility: Yes How Accessible: Accessible via walker Home Care Services: No  Discharge Living Setting Plans for Discharge Living Setting: Patient's home (son to live with her to help) Type of Home at Discharge: House Discharge Home Layout: Two level;Able to live on main level with bedroom/bathroom Alternate Level Stairs-Rails:  (doesn't use) Discharge Home Access: Stairs to enter (step up) Entrance Stairs-Rails: None Entrance Stairs-Number of Steps: 1 Discharge Bathroom Shower/Tub: Tub/shower unit;Curtain (hand held Teaching laboratory technician) Discharge Bathroom Toilet: Standard (with 3 in 1) Discharge Bathroom Accessibility: Yes How Accessible: Accessible via walker  (sideways) Do you have any problems obtaining your medications?: No  Social/Family/Support Systems Patient Roles: Parent Contact Information: home [phone] Anticipated Caregiver: son-Buddy Pflum Anticipated Caregiver's Contact Information: [phone], cell [phone] Ability/Limitations of Caregiver: can provide assistance Caregiver Availability: 24/7 Discharge Plan Discussed with Primary Caregiver: Yes Is Caregiver In Agreement with Plan?: Yes Does Caregiver/Family have Issues with Lodging/Transportation while Pt is in Rehab?: No  Goals/Additional Needs Patient/Family Goal for Rehab: Supervision PT, Supervision OT, SLP-NA Expected length of stay: 2 weeks Cultural Considerations: none Dietary Needs: none Equipment Needs: to be determined Pt/Family Agrees to Admission and willing to participate: Yes Program Orientation Provided & Reviewed with Pt/Caregiver Including Roles  & Responsibilities: Yes  Patient Condition: This patient's condition remains as documented in the Consult dated 09/27/11 @ 1015, in which the Rehabilitation Physician determined and documented that the patient's condition is appropriate for intensive rehabilitative care in an inpatient rehabilitation facility.  Preadmission Screen Completed By:  Oletta Darter, 09/28/2011 11:09 AM ______________________________________________________________________   Discussed status with Dr. Wynn Banker on5/2/13 at  11:00 and received telephone approval for admission today.  Admission Coordinator:  Oletta Darter, 09/28/11 @ 11:31am

## 2011-09-29 ENCOUNTER — Inpatient Hospital Stay (HOSPITAL_COMMUNITY): Payer: Medicare Other

## 2011-09-29 DIAGNOSIS — S72142A Displaced intertrochanteric fracture of left femur, initial encounter for closed fracture: Secondary | ICD-10-CM

## 2011-09-29 LAB — COMPREHENSIVE METABOLIC PANEL
AST: 21 U/L (ref 0–37)
CO2: 27 mEq/L (ref 19–32)
Calcium: 8.2 mg/dL — ABNORMAL LOW (ref 8.4–10.5)
Creatinine, Ser: 0.71 mg/dL (ref 0.50–1.10)
GFR calc Af Amer: 84 mL/min — ABNORMAL LOW (ref >90)
GFR calc non Af Amer: 72 mL/min — ABNORMAL LOW (ref >90)
Glucose, Bld: 122 mg/dL — ABNORMAL HIGH (ref 70–99)

## 2011-09-29 LAB — CBC
MCH: 29.2 pg (ref 26.0–34.0)
MCV: 87 fL (ref 78.0–100.0)
Platelets: 139 10*3/uL — ABNORMAL LOW (ref 150–400)
RBC: 3.08 MIL/uL — ABNORMAL LOW (ref 3.87–5.11)

## 2011-09-29 LAB — DIFFERENTIAL
Basophils Absolute: 0 10*3/uL (ref 0.0–0.1)
Lymphocytes Relative: 13 % (ref 12–46)
Neutro Abs: 9.5 10*3/uL — ABNORMAL HIGH (ref 1.7–7.7)

## 2011-09-29 LAB — MRSA PCR SCREENING: MRSA by PCR: NEGATIVE

## 2011-09-29 MED ORDER — DICLOFENAC SODIUM 1 % TD GEL
Freq: Four times a day (QID) | TRANSDERMAL | Status: DC
  Administered 2011-09-29 – 2011-10-06 (×31): via TOPICAL
  Administered 2011-10-07: 1 via TOPICAL
  Administered 2011-10-07 – 2011-10-13 (×23): via TOPICAL
  Filled 2011-09-29: qty 100

## 2011-09-29 NOTE — Progress Notes (Signed)
Nursing Note: Oxygen level 83 % on r/a with am vital signs.pt awakened easily in no distress. A; Oxygen at 2L n/c applied and oxygen level up to 96 %.wbb

## 2011-09-29 NOTE — Progress Notes (Signed)
Per State Regulation 482.30 This chart was reviewed for medical necessity with respect to the patient's Admission/Duration of stay. [place], [place] Ketcher Anne                 Nurse Care Manager            Next Review Date: 10/02/11   

## 2011-09-29 NOTE — Progress Notes (Signed)
Physical Therapy Note  Patient Details  Name: Misty Shah MRN: [id number] Date of Birth: 1916/02/19 Today's Date: 09/29/2011  1300-1340 (40 minutes) individual Pain: no complaint of pain/premedicated Focus of treatment: Transfer training with RW; bed mobility training; therapeutic exercises LT LE; gait training with RW WBAT LT Le Treatment: Transfers - sit to stand min assist with vcs for hand placement ; SPT with RW min assist with decreased step length bilaterally (decreased weight bearing LT LE); sit to supine mod assist bilateral LEs; AA hip flexion LT LE, AA hip abduction LT LE, SAQs bilaterally (all X 15) ; gait - 30 feet X 1 RW min assist with improving step length bilaterally.; standing alternate hip flexion with RW support to improve step length (X 10).   Mihail Prettyman,JIM 09/29/2011, 2:08 PM

## 2011-09-29 NOTE — Progress Notes (Signed)
Social Work Assessment and Plan Social Work Assessment and Plan  Patient Details  Name: Misty Shah MRN: [id number] Date of Birth: 07/09/1915  Today's Date: 09/29/2011  Problem List:  Patient Active Problem List  Diagnoses  . Hip fracture  . Hypertension  . Osteoporosis  . Hypothyroidism  . UTI (lower urinary tract infection)  . Intertrochanteric fracture of left hip   Past Medical History:  Past Medical History  Diagnosis Date  . Hypertension   . Arthritis   . Hypothyroidism   . UTI (lower urinary tract infection)   . Anxiety   . Claustrophobia   . Dislocated shoulder     hx bilateral shoulders - had therapy on them  . Shingles    Past Surgical History:  Past Surgical History  Procedure Date  . Cholecystectomy   . Thyroidectomy   . Tonsillectomy   . Appendectomy   . Hip surgery     right hip  . Colonoscopy   . Cataract extraction, bilateral    Social History:  reports that she has never smoked. She has never used smokeless tobacco. She reports that she does not drink alcohol or use illicit drugs.  Family / Support Systems Marital Status: Widow/Widower Patient Roles: Parent Children: Shamecka Hocutt  [phone]  [phone] Other Supports: Two other children-one son in [employer] Anticipated Caregiver: Buddy-son Ability/Limitations of Caregiver: can provide 24 hour care if necessary Caregiver Availability: 24/7 Family Dynamics: Son checks daily on pt at home, pt has been falling frequently at home.  She is unsure reason why.  Doesn't always use walker at home, son reports hard headed.  Social History Preferred language: English Religion:  Cultural Background: NO issues Education: High School Read: Yes Write: Yes Employment Status: Retired Fish farm manager Issues: NO issues Guardian/Conservator: None   Abuse/Neglect Physical Abuse: Denies Verbal Abuse: Denies Sexual Abuse: Denies Exploitation of patient/patient's resources:  Denies Self-Neglect: Denies  Emotional Status Pt's affect, behavior adn adjustment status: Pt wants to do well and get home soon.  She reports this has acted up her arthritis, putting too much pressure on her hand using the walker.  Son reports she usually does well. Recent Psychosocial Issues: other medical issues Pyschiatric History: History of anxiety takes meds every now and then, but doesn't like to take meds, hurts her stomach.  Deferred Depression Screen due to pt and son felt not necessary.  Will continue to monitor her coping while here. Substance Abuse History: No issues  Patient / Family Perceptions, Expectations & Goals Pt/Family understanding of illness & functional limitations: Pt and son can explain her hip fracture and WB issues.  Pt is hopeful she will be able to walk with a walker alone by discharge.  pt does have questions regarding why she keeps falling at home.  Son to ask the MD. Premorbid pt/family roles/activities: Mother, grandmother, Retiree, Home owner, Church member, etc Anticipated changes in roles/activities/participation: Resume once home Pt/family expectations/goals: Pt states: " I want to get walking on my own."  Son reports: " I hope she does well here and doesn't fall as much at home."  He is concerned about her falling at home.  Community Resources [employer]: None Premorbid Home Care/DME Agencies: None Transportation available at discharge: Family  Discharge Planning Living Arrangements: Alone Support Systems: Children;Church/faith community Type of Residence: Private residence Insurance Resources: Administrator (specify) Building services engineer) Financial Resources: Restaurant manager, fast food Screen Referred: No Living Expenses: Own Money Management: Family Do you have any problems obtaining your medications?:  No Home Management: Pt does light housekeeping, while son assists with outside work, vacuuming and transportation Patient/Family  Preliminary Plans: Return to her home with son providing assistance, if necessary he can arrange 24 hour care.  Discussed at discharge someone with her would be recommended for safety and with her history of falling prior to admission.  She may require supervision level due to falls. Social Work Anticipated Follow Up Needs: HH/OP  Clinical Impression Pleasant independent lady, son here to observe in therapies.  Discussed 24 hour supervision would be needed with her history of falling at home.  Also has shoulder and arthritis issues which Limit her.  Await team's evaluations.  Lucy Chris 09/29/2011, 9:57 AM

## 2011-09-29 NOTE — Progress Notes (Signed)
Physical Therapy Assessment and Plan  Patient Details  Name: Misty Shah MRN: [id number] Date of Birth: [birth year]  PT Diagnosis: Abnormal posture, Abnormality of gait, Difficulty walking, Muscle weakness, Osteoarthritis and Pain in joint Rehab Potential: Good ELOS: ~2.5 weeks   Today's Date: 09/29/2011 Time: 4098-1191 Time Calculation (min): 65 min  Problem List:  Patient Active Problem List  Diagnoses  . Hip fracture  . Hypertension  . Osteoporosis  . Hypothyroidism  . UTI (lower urinary tract infection)  . Intertrochanteric fracture of left hip    Past Medical History:  Past Medical History  Diagnosis Date  . Hypertension   . Arthritis   . Hypothyroidism   . UTI (lower urinary tract infection)   . Anxiety   . Claustrophobia   . Dislocated shoulder     hx bilateral shoulders - had therapy on them  . Shingles    Past Surgical History:  Past Surgical History  Procedure Date  . Cholecystectomy   . Thyroidectomy   . Tonsillectomy   . Appendectomy   . Hip surgery     right hip  . Colonoscopy   . Cataract extraction, bilateral     Assessment & Plan Clinical Impression:   78 year old right-handed white female admitted 09/25/2011 after a fall. Patient lives alone and uses a walker at times. Patient with multiple falls in the past. Sustained a left displaced intertrochanteric hip fracture. Underwent closed reduction and internal fixation with intramedullary nail 09/25/2011 per Dr. Cleophas Dunker. Weightbearing as tolerated. Patient transferred to CIR on 09/28/2011 .   Patient currently requires mod with mobility secondary to muscle weakness and impaired timing and sequencing and unbalanced muscle activation. Evaluation limited secondary to an arthritic flare of Rt. Hand which impaired her ability to utilize RW. She is currently max assist with bed mobility and moderate assist with mobility. Pt reporting long h/o repeated falls (fractured other hip 8-10 years ago) and falls  frequently at home.  She does not like to use the RW at home. Prior to hospitalization, patient was Independent with mobility and lived with Alone in a House.At D/C son will be available daily but not 24 hours, can be over at night if needed. Home access is 2 Stairs to enter,  back door is level entry but not desired entrance (has to go through fence and walk across grass about 20'  Patient will benefit from skilled PT intervention to maximize safe functional mobility, minimize fall risk and decrease caregiver burden for planned discharge home with intermittent assist.  Anticipate patient will benefit from follow up [facility] at discharge.  PT - End of Session Endurance Deficit: Yes Endurance Deficit Description: Fatigues very quickly.  PT Assessment Rehab Potential: Good Barriers to Discharge: Decreased caregiver support PT Plan PT Frequency: 2-3 X/day, 60-90 minutes Estimated Length of Stay: 2 to 2.5 weeks PT Treatment/Interventions: Ambulation/gait training;Balance/vestibular training;Discharge planning;Pain management;DME/adaptive equipment instruction;Functional mobility training;Neuromuscular re-education;Patient/family education;Stair training;Therapeutic Activities;Therapeutic Exercise;UE/LE Strength taining/ROM;UE/LE Coordination activities;Wheelchair propulsion/positioning PT Recommendation Follow Up Recommendations: Home health PT (intermittant supervision) Equipment Recommended: Wheelchair (measurements);Wheelchair cushion (measurements) Equipment Details: Pt may need a wheelchair for long distances/community needs. Will see how pt progresses.   PT Evaluation Precautions/Restrictions Precautions Precautions: Fall Restrictions Weight Bearing Restrictions: No LLE Weight Bearing: Weight bearing as tolerated  Vital Signs Therapy Vitals BP: 127/47 mmHg Patient Position, if appropriate: Sitting Pain Pain Assessment Pain Assessment: 0-10 Pain Score:   5 Pain Type: Acute pain Pain  Location: Hand Pain Orientation: Right Pain Descriptors: Aching (difficulty describing) Pain  Onset: On-going Pain Intervention(s): Repositioned;RN made aware (doctor has ordered some cream) Home Living/Prior Functioning Home Living Lives With: Alone (son will be able to be around daily, if needed night) Available Help at Discharge: Family;Available PRN/intermittently (not sure that 24 hour assist can be arranged.  ) Type of Home: House Home Access: Stairs to enter Entrance Stairs-Number of Steps: 2, back door is level entry but not desired entrance (has to go through fence and walk across grass about 20').  Entrance Stairs-Rails: Right (has something to hold to there) Home Layout: Two level;Full bath on main level;Able to live on main level with bedroom/bathroom (doesn't go upstairs.  ) Bathroom Shower/Tub: Tub/shower unit;Curtain (at times takes a sponge bath) Bathroom Toilet: Standard (with 3-in-1) Bathroom Accessibility: Yes How Accessible: Accessible via walker (sideways) Home Adaptive Equipment: Grab bars in shower;Hand-held shower hose;Shower chair with back;Walker - rolling;Straight cane;Bedside commode/3-in-1 Additional Comments: Pt uses RW some of the time but prefers to furniture cruise. Was bending over to straighten out a rug, lost balance, and fell.  Prior Function Light Housekeeping:  (needs assist with heavy housework like vacuuming and moping.) Able to Take Stairs?: No (not without assistance from family) Driving: No (has never driven) Vocation: Retired Comments: Likes going to church and going to Teacher, music. Likes to talk on the phone.   Cognition Overall Cognitive Status: Impaired Arousal/Alertness: Awake/alert Orientation Level: Oriented X4 Memory: Impaired Memory Impairment: Decreased recall of new information (likely WNL for age) Sensation Sensation Light Touch: Appears Intact (bil. LEs) Coordination Gross Motor Movements are Fluid and Coordinated:  Yes Motor  Motor Motor: Within Functional Limits  Mobility Bed Mobility Bed Mobility: Rolling Right Rolling Right: 3: Mod assist Rolling Right Details: Tactile cues for sequencing;Verbal cues for technique Rolling Right Details (indicate cue type and reason): Verbal cues for pain modulation, pt attempting by self unable to fully complete.  Supine to Sit: 2: Max assist Supine to Sit Details: Tactile cues for sequencing;Verbal cues for sequencing Supine to Sit Details (indicate cue type and reason): Utilization of pad to position pelvis. Verbal cues for sequence, use of UEs, and contribution.  Sitting - Scoot to Edge of Bed: 5: Supervision Sitting - Scoot to [place] of Bed Details (indicate cue type and reason): Verbal cues to prevent scooting too far to EOB.  Transfers Sit to Stand: 3: Mod assist;From bed;From chair/3-in-1;With armrests Sit to Stand Details (indicate cue type and reason): Repeated verbal cues for UE placement. Pt attempts to pull up on RW repeatedly. Assist for anterior trunk translation and seconadry to weakness. Delayed hip extension to reach full standing.  Stand to Sit: 3: Mod assist Stand to Sit Details (indicate cue type and reason): Verbal cues for sequencing Stand to Sit Details: Verbal cues for safe RW placement and UE placement (pt attempts to sit without making sure chair behind self).  Stand Pivot Transfers: 1: +1 Total assist (wihtout device) Stand Pivot Transfer Details: Tactile cues for sequencing;Verbal cues for sequencing Stand Pivot Transfer Details (indicate cue type and reason): Pt very hesitant to come to full standing, reaches for armrest of chair. Very limited weight shift to Lt.  Locomotion  Ambulation Ambulation: Yes Ambulation/Gait Assistance: 3: Mod assist Ambulation Distance (Feet): 10 Feet Assistive device: Rolling walker Ambulation/Gait Assistance Details: Verbal cues for technique;Verbal cues for sequencing Ambulation/Gait Assistance Details:  Pt unable to ambulate without device. Limited today with device secondary to Rt. hand pain from arthritis. Verbal cues needed for positioning of bil. LEs for safety (tends to  have Lt. LE far in front, Rt. LE far behind).  Gait Gait: Yes Gait Pattern: Impaired Gait Pattern: Decreased weight shift to left;Decreased step length - right;Decreased step length - left;Trunk flexed (hops on Rt. foot, inches Lt. forwards) Stairs / Additional Locomotion Stairs: No (secondary to hip and Rt. Hand pain)  Trunk/Postural Assessment    kyphotic posture Balance Balance Balance Assessed: Yes Static Sitting Balance Static Sitting - Balance Support: Bilateral upper extremity supported;Feet supported Static Sitting - Level of Assistance: 5: Stand by assistance Static Sitting - Comment/# of Minutes: Has Rt. lean in sitting, reports decreased tolerance of sitting on Lt. hip initially, improved with time. Extremity Assessment      RLE Assessment RLE Assessment: Exceptions to [facility] RLE AROM (degrees) RLE Overall AROM Comments: WFL RLE Strength RLE Overall Strength: Deficits RLE Overall Strength Comments: Generalized decondiitoning, grossly 3+/5 LLE Assessment LLE Assessment: Exceptions to WFL LLE AROM (degrees) LLE Overall AROM Comments: Decreased active hip flexion.  LLE Strength LLE Overall Strength: Deficits LLE Overall Strength Comments: Grossly 3/5, weaker than Rt. LE. hip flexion not tested secondary to pain but able to move against gravity with limited ROM  See FIM for current functional status Refer to Care Plan for Long Term Goals  Recommendations for other services: None  Discharge Criteria: Patient will be discharged from PT if patient refuses treatment 3 consecutive times without medical reason, if treatment goals not met, if there is a change in medical status, if patient makes no progress towards goals or if patient is discharged from hospital.  The above assessment, treatment plan,  treatment alternatives and goals were discussed and mutually agreed upon: by patient and by family  Wilhemina Bonito 09/29/2011, 10:12 AM

## 2011-09-29 NOTE — H&P (Signed)
Physical Medicine and Rehabilitation Admission H&P    No chief complaint on file. : HPI: 78 year old right-handed white female admitted 09/25/2011 after a fall. Patient lives alone and uses a walker at times. Patient with multiple falls in the past. Sustained a left displaced intertrochanteric hip fracture. Underwent closed reduction and internal fixation with intramedullary nail 09/25/2011 per Dr. Cleophas Dunker. Weightbearing as tolerated and placed on subcutaneous Lovenox for deep vein thrombosis prophylaxis. Postoperative anemia 7.7 she was transfused with followup hemoglobin 9.1. Postoperative hypokalemia 2.7 09/28/11 KCl 40 mEq given 09/28/11. Klebsiella urinary tract infection with IV Rocephin added and plan to change to by mouth antibiotics. Physical therapy evaluation completed 09/26/2011 with recommendations for physical medicine rehabilitation consult to consider inpatient rehabilitation services  Review of Systems  HENT: Positive for hearing loss.  Gastrointestinal: Positive for constipation.  Musculoskeletal: Positive for falls.  Psychiatric/Behavioral:  Anxiety  All other systems reviewed and are negative   Past Medical History  Diagnosis Date  . Hypertension   . Arthritis   . Hypothyroidism   . UTI (lower urinary tract infection)   . Anxiety   . Claustrophobia   . Dislocated shoulder     hx bilateral shoulders - had therapy on them  . Shingles    Past Surgical History  Procedure Date  . Cholecystectomy   . Thyroidectomy   . Tonsillectomy   . Appendectomy   . Hip surgery     right hip  . Colonoscopy   . Cataract extraction, bilateral    History reviewed. No pertinent family history. Social History:  reports that she has never smoked. She has never used smokeless tobacco. She reports that she does not drink alcohol or use illicit drugs. Allergies:  Allergies  Allergen Reactions  . Codeine Other (See Comments)    unknown  . Epinephrine Other (See Comments)    unknown   . Sulfur Other (See Comments)    unknown  . Tetanus Toxoids Other (See Comments)    unknown   Medications Prior to Admission  Medication Sig Dispense Refill  . amLODipine (NORVASC) 5 MG tablet Take 5 mg by mouth daily.      Marland Kitchen aspirin EC 81 MG tablet Take 81 mg by mouth daily.      . bisacodyl (DULCOLAX) 5 MG EC tablet Take 5 mg by mouth daily as needed. For constipation      . ciprofloxacin (CIPRO) 500 MG tablet Take 1 tablet (500 mg total) by mouth 2 (two) times daily.      Marland Kitchen docusate sodium 100 MG CAPS Take 100 mg by mouth 2 (two) times daily.  10 capsule    . enoxaparin (LOVENOX) 40 MG/0.4ML injection Inject 0.4 mLs (40 mg total) into the skin daily.  0 Syringe    . levothyroxine (SYNTHROID, LEVOTHROID) 100 MCG tablet Take 100 mcg by mouth daily.      . methocarbamol (ROBAXIN) 500 MG tablet Take 500 mg by mouth every 6 (six) hours as needed. For spasms      . metoprolol (LOPRESSOR) 100 MG tablet Take 100 mg by mouth 2 (two) times daily.      . traMADol (ULTRAM) 50 MG tablet Take 50 mg by mouth every 6 (six) hours as needed. For pain relief      . DISCONTD: bisacodyl (DULCOLAX) 5 MG EC tablet Take 1 tablet (5 mg total) by mouth daily as needed.  30 tablet    . DISCONTD: methocarbamol (ROBAXIN) 500 MG tablet Take 1 tablet (500 mg total)  by mouth every 6 (six) hours as needed.        Home:     Functional History:    Functional Status:  Mobility:          ADL:    Cognition: Cognition Orientation Level: Oriented X4     Blood pressure 137/56, pulse 94, temperature 99.4 F (37.4 C), temperature source Oral, resp. rate 18, height 5\' 2"  (1.575 m), weight 53.9 kg (118 lb 13.3 oz), SpO2 94.00%. Physical Exam  Vitals reviewed.  Constitutional: She is oriented to person, place, and time. She appears well-developed.  HENT:  Head: Normocephalic.  Eyes: Conjunctivae and EOM are normal. Pupils are equal, round, and reactive to light.  Neck: Neck supple. No thyromegaly present.    Cardiovascular: Regular rhythm.  Pulmonary/Chest: Effort normal and breath sounds normal. She has no wheezes.  Abdominal: She exhibits no distension. There is no tenderness.  Musculoskeletal: She exhibits no edema.  Left hip clean and intact. Appropriately tender.   Arthritic changes in hands bilaterally.  Neurological: She is alert and oriented to person, place, and time. No cranial nerve deficit or sensory deficit.  Follows three-step commands. Difficulty moving left leg at all in bed while supine. Can lift right leg 2/5. Otherwise patients movement 2-3/5. 4/5 in BUEs, Sensory intact to lt touch Skin:  Hip incision is dressed with staples intact no drainage  Psychiatric: She has a normal mood and affect. Her behavior is normal. Judgment and thought content normal.     Results for orders placed during the hospital encounter of 09/28/11 (from the past 48 hour(s))  POTASSIUM     Status: Normal   Collection Time   09/28/11  8:00 PM      Component Value Range Comment   Potassium 4.0  3.5 - 5.1 (mEq/L)   MRSA PCR SCREENING     Status: Normal   Collection Time   09/28/11  9:51 PM      Component Value Range Comment   MRSA by PCR NEGATIVE  NEGATIVE    CBC     Status: Abnormal   Collection Time   09/29/11  5:43 AM      Component Value Range Comment   WBC 13.0 (*) 4.0 - 10.5 (K/uL)    RBC 3.08 (*) 3.87 - 5.11 (MIL/uL)    Hemoglobin 9.0 (*) 12.0 - 15.0 (g/dL)    HCT 40.9 (*) 81.1 - 46.0 (%)    MCV 87.0  78.0 - 100.0 (fL)    MCH 29.2  26.0 - 34.0 (pg)    MCHC 33.6  30.0 - 36.0 (g/dL)    RDW 91.4  78.2 - 95.6 (%)    Platelets 139 (*) 150 - 400 (K/uL)   DIFFERENTIAL     Status: Abnormal   Collection Time   09/29/11  5:43 AM      Component Value Range Comment   Neutrophils Relative 73  43 - 77 (%)    Neutro Abs 9.5 (*) 1.7 - 7.7 (K/uL)    Lymphocytes Relative 13  12 - 46 (%)    Lymphs Abs 1.7  0.7 - 4.0 (K/uL)    Monocytes Relative 13 (*) 3 - 12 (%)    Monocytes Absolute 1.7 (*) 0.1 - 1.0  (K/uL)    Eosinophils Relative 1  0 - 5 (%)    Eosinophils Absolute 0.1  0.0 - 0.7 (K/uL)    Basophils Relative 0  0 - 1 (%)    Basophils Absolute 0.0  0.0 - 0.1 (K/uL)    No results found.  Post Admission Physician Evaluation: 1. Functional deficits secondary  to L displace intertrochanteric fracture s/p ORIF. 2. Patient is admitted to receive collaborative, interdisciplinary care between the physiatrist, rehab nursing staff, and therapy team. 3. Patient's level of medical complexity and substantial therapy needs in context of that medical necessity cannot be provided at a lesser intensity of care such as a SNF. 4. Patient has experienced substantial functional loss from his/her baseline which was documented above under the "Functional History" and "Functional Status" headings.  Judging by the patient's diagnosis, physical exam, and functional history, the patient has potential for functional progress which will result in measurable gains while on inpatient rehab.  These gains will be of substantial and practical use upon discharge  in facilitating mobility and self-care at the household level. 5. Physiatrist will provide 24 hour management of medical needs as well as oversight of the therapy plan/treatment and provide guidance as appropriate regarding the interaction of the two. 6. 24 hour rehab nursing will assist with bladder management, bowel management, safety, skin/wound care, disease management, medication administration, pain management and patient education  and help integrate therapy concepts, techniques,education, etc. 7. PT will assess and treat for:  Pre gait, gait,endurance equipment safety.  Goals are: S/mod I Mobility. 8. OT will assess and treat for: ADLs , safety endurance equipment, balance.   Goals are: S/Mod I ADL. 9. SLP will assess and treat for: n/a.  Goals are: n/a. 10. Case Management and Social Worker will assess and treat for psychological issues and discharge  planning. 11. Team conference will be held weekly to assess progress toward goals and to determine barriers to discharge. 12.  Patient will receive at least 3 hours of therapy per day at least 5 days per week. 13. ELOS and Prognosis: 7-10 days good   Medical Problem List and Plan: 1. left displaced intertrochanteric hip fracture. Status post closed reduction and internal fixation with intramedullary nail 09-25-11. Weightbearing as tolerated 2. DVT Prophylaxis/Anticoagulation: Subcutaneous Lovenox. Monitor platelet counts any signs of bleeding 3. Pain Management: Tramadol 50 mg every 6 hours as needed and Robaxin as needed. Monitor mental status 4. postoperative anemia. Patient has been transfused. Latest hemoglobin 9.1. Followup CBC 5. Hypertension Norvasc 5 mg daily, Lotensin 20 mg daily, HCTZ 12.5 mg daily, Lopressor 100 mg twice daily. Monitor with increased mobility 6. Hypothyroidism. Synthroid 7. Hypokalemia. Supplement provided and followup labs. 8. Klebsiella urinary tract infection. Change IV Rocephin to Cipro 9.  Hypomagnesia follow labs after transfusion  Erick Colace 09/29/2011, 6:14 AM

## 2011-09-29 NOTE — Evaluation (Signed)
Occupational Therapy Assessment and Plan  Patient Details  Name: Misty Shah MRN: [id number] Date of Birth: 1915/06/15  OT Diagnosis: abnormal posture, acute pain, muscle weakness (generalized) and pain in joint Rehab Potential: Rehab Potential: Good ELOS: 2 weeks   Today's Date: 09/29/2011 Time: 1030-1130 Time Calculation (min): 60 min  Problem List:  Patient Active Problem List  Diagnoses  . Hip fracture  . Hypertension  . Osteoporosis  . Hypothyroidism  . UTI (lower urinary tract infection)  . Intertrochanteric fracture of left hip    Past Medical History:  Past Medical History  Diagnosis Date  . Hypertension   . Arthritis   . Hypothyroidism   . UTI (lower urinary tract infection)   . Anxiety   . Claustrophobia   . Dislocated shoulder     hx bilateral shoulders - had therapy on them  . Shingles    Past Surgical History:  Past Surgical History  Procedure Date  . Cholecystectomy   . Thyroidectomy   . Tonsillectomy   . Appendectomy   . Hip surgery     right hip  . Colonoscopy   . Cataract extraction, bilateral     Assessment & Plan Clinical Impression: 78 year old right-handed white female admitted 09/25/2011 after a fall. Patient lives alone and uses a walker at times. Patient with multiple falls in the past. Sustained a left displaced intertrochanteric hip fracture. Underwent closed reduction and internal fixation with intramedullary nail 09/25/2011 per Dr. Cleophas Dunker. Weightbearing as tolerated.  Patient transferred to CIR on 09/28/2011 .    Patient currently requires max to min assist with basic self-care skills secondary to muscle weakness, decreased cardiorespiratoy endurance and decreased oxygen support and decreased sitting balance, decreased standing balance, decreased postural control and decreased balance strategies.  Prior to hospitalization, patient could complete BADL and light housekeeping (per patient) with Modified Independence.  Patient will  benefit from skilled intervention to increase independence with basic self-care skills and increase level of independence with iADL prior to discharge home with 24/7 assistance/supervision from daughter for ~1 week, then local sons can check in on her PRN.  Unsure of the plan if patient will required more 24/7 care after daughter leaves.  Anticipate patient will require 24 hour supervision and follow up home health.  OT - End of Session Activity Tolerance: Tolerates 30+ min activity with multiple rests Endurance Deficit: Yes Endurance Deficit Description: Fatigues quickly OT Assessment Rehab Potential: Good Barriers to Discharge: None (Unsure 24/7 available after first week home when dtr leaves) OT Plan OT Frequency: 1-2 X/day, 60-90 minutes Estimated Length of Stay: 2 weeks OT Treatment/Interventions: Balance/vestibular training;Discharge planning;DME/adaptive equipment instruction;Functional mobility training;Pain management;Patient/family education;Self Care/advanced ADL retraining;Psychosocial support;Therapeutic Activities;Therapeutic Exercise OT Recommendation Follow Up Recommendations: Home health OT Equipment Recommended: Tub/shower bench;Wheelchair (measurements) Equipment Details: has shower chair however, may need tub bench for safety and increased independence  OT Evaluation Precautions/Restrictions  Precautions Precautions: Fall, HOH Restrictions Weight Bearing Restrictions: No LLE Weight Bearing: Weight bearing as tolerated Pain Right hand - 5/10, LLE 8/10, Premedicated and able to work through her pain. Home Living/Prior Functioning Home Living Lives With: Alone Available Help at Discharge: Family (son in [place], son in [place], daughter from [place] will stay 1 wk) Type of Home: House Home Access: Stairs to enter [employer] of Steps: 2, back door is level entry but not desired entrance (has to go through fence and walk across grass about 20').  Entrance  Stairs-Rails: Right (has something to hold to there) Home Layout: Two  level;Full bath on main level;Able to live on main level with bedroom/bathroom (doesn't go upstairs) Bathroom Shower/Tub: Tub/shower unit;Curtain (takes sponge bath at times) Bathroom Toilet: Standard (with 3-in-1 over commode) Bathroom Accessibility: Yes How Accessible: Accessible via walker Home Adaptive Equipment: Grab bars in shower;Hand-held shower hose;Shower chair with back;Walker - rolling;Straight cane;Bedside commode/3-in-1 Additional Comments: Pt uses RW some of the time byt prefers to furniture cruise.  Prior Function Level of Independence: Independent with basic ADLs;Needs assistance with homemaking;Independent with transfers Light Housekeeping:  (needs assistance with vacuuming and mopping) Able to Take Stairs?: No (not without assistance from family) Driving: No (has never driven) Vocation: Retired Comments: Likes going to church and going to Teacher, music. Likes to talk on the phone Vision/Perception  Vision - History Patient Visual Report: No change from baseline  Cognition Overall Cognitive Status: Impaired Arousal/Alertness: Awake/alert Orientation Level: Oriented X4 Memory: Impaired Memory Impairment: Decreased recall of new information (likely WNL for age) Sensation Sensation Light Touch: Appears Intact (bil. LEs and UEs) Coordination Gross Motor Movements are Fluid and Coordinated: Yes Motor  Motor Motor: Within Functional Limits Mobility  Bed Mobility Bed Mobility: Rolling Right Rolling Right: 3: Mod assist Rolling Right Details: Tactile cues for sequencing;Verbal cues for technique Rolling Right Details (indicate cue type and reason): Verbal cues for pain modulation, pt attempting by self unable to fully complete.  Supine to Sit: 2: Max assist Supine to Sit Details: Tactile cues for sequencing;Verbal cues for sequencing Supine to Sit Details (indicate cue type and reason): Utilization  of pad to position pelvis. Verbal cues for sequence, use of UEs, and contribution.  Sitting - Scoot to Edge of Bed: 5: Supervision Sitting - Scoot to [place] of Bed Details (indicate cue type and reason): Verbal cues to prevent scooting too far to EOB.  Transfers Sit to Stand: 3: Mod assist;From bed;From chair/3-in-1;With armrests Sit to Stand Details (indicate cue type and reason): Repeated verbal cues for UE placement. Pt attempts to pull up on RW repeatedly. Assist for anterior trunk translation and seconadry to weakness. Delayed hip extension to reach full standing.  Stand to Sit: 3: Mod assist Stand to Sit Details (indicate cue type and reason): Verbal cues for sequencing Stand to Sit Details: Verbal cues for safe RW placement and UE placement (pt attempts to sit without making sure chair behind self).   Trunk/Postural Assessment  Kyphosis with rig cage flared in mid back area Balance Balance Balance Assessed: Yes Static Sitting Balance Static Sitting - Balance Support: Bilateral upper extremity supported;Feet supported Static Sitting - Level of Assistance: 5: Stand by assistance Static Sitting - Comment/# of Minutes: Has Rt. lean in sitting, reports decreased tolerance of sitting on Lt. hip initially, improved with time. Extremity/Trunk Assessment RUE Assessment: Exceptions to [facility] RUE Overall AROM Comments: H/O shoulder injury, AROM ~90 degrees, "Sore" hand with arthritic flair up now with limited full ROM RUE Overall Strength Comments: grossly 3/5 - 4/5, poor grip strength due to pain and edema LUE Assessment: Exceptions to WFL LUE Overall AROM Comments: H/O shoulder injury -  AROM ~90 degrees, hand WFL with arthritic changes noted in hand LUE Overall Strength Comments: grossly 4/5 overall  See FIM for current functional status Refer to Care Plan for Long Term Goals  Recommendations for other services: None  Discharge Criteria: Patient will be discharged from OT if patient refuses  treatment 3 consecutive times without medical reason, if treatment goals not met, if there is a change in medical status, if patient makes no  progress towards goals or if patient is discharged from hospital.  The above assessment, treatment plan, treatment alternatives and goals were discussed and mutually agreed upon: by patient and by family (daughter-Barbara)  Starlit Raburn 09/29/2011, 11:54 AM

## 2011-09-29 NOTE — Progress Notes (Signed)
[facility] Individual Statement of Services  Patient Name:  Misty Shah  Date:  09/29/2011  Welcome to the [facility].  Our goal is to provide you with an individualized program based on your diagnosis and situation, designed to meet your specific needs.  With this comprehensive rehabilitation program, you will be expected to participate in at least 3 hours of rehabilitation therapies Monday-Friday, with modified therapy programming on the weekends.  Your rehabilitation program will include the following services:  Physical Therapy (PT), Occupational Therapy (OT), 24 hour per day rehabilitation nursing, Therapeutic Recreaction (TR), Case Management (RN and Child psychotherapist), Rehabilitation Medicine, Nutrition Services and Pharmacy Services  Weekly team conferences will be held on Tuesdays to discuss your progress.  Your RN Case Designer, television/film set will talk with you frequently to get your input and to update you on team discussions.  Team conferences with you and your family in attendance may also be held.  Expected length of stay: 2 - 2.5 weeks   Overall anticipated outcome: Minimum assistance - supervision  Depending on your progress and recovery, your program may change.  Your RN Case Estate agent will coordinate services and will keep you informed of any changes.  Your RN Sports coach and SW names and contact numbers are listed  below.  The following services may also be recommended but are not provided by the [facility]:   Driving Evaluations  Home Health Rehabiltiation Services  Outpatient Rehabilitatation [facility]  Vocational Rehabilitation   Arrangements will be made to provide these services after discharge if needed.  Arrangements include referral to agencies that provide these services.  Your insurance has been verified to be:  Medicare + AARP Your primary doctor is:  Dr Juleen China  Pertinent  information will be shared with your doctor and your insurance company.  Case Manager: Lutricia Horsfall, [facility] [phone]  Social Worker:  Dossie Der, [place] [phone]  Information discussed with and copy given to patient by: Meryl Dare, 09/29/2011

## 2011-09-29 NOTE — Progress Notes (Signed)
Nursing Note: Dr. Riley Kill in and made aware of low oxygen level and need for oxygen.wbb

## 2011-09-29 NOTE — Progress Notes (Addendum)
Patient ID: Misty Shah, female   DOB: [birth year], 78 y.o.   MRN: [id number] Subjective/Complaints: Complains of right hand,thumb pain with swelling. Decreased oxygen sats this am-requiring oxygen [place] All other ROS negative.  Objective: Vital Signs: Blood pressure 137/56, pulse 94, temperature 99.4 F (37.4 C), temperature source Oral, resp. rate 18, height 5\' 2"  (1.575 m), weight 53.9 kg (118 lb 13.3 oz), SpO2 94.00%. No results found.  Basename 09/29/11 0543 09/28/11 0455  WBC 13.0* 11.2*  HGB 9.0* 9.1*  HCT 26.8* 26.9*  PLT 139* 99*    Basename 09/29/11 0543 09/28/11 2000 09/28/11 0455  NA 133* -- 133*  K 3.9 4.0 --  CL 98 -- 97  CO2 27 -- 29  GLUCOSE 122* -- 116*  BUN 15 -- 11  CREATININE 0.71 -- 0.67  CALCIUM 8.2* -- 7.6*   CBG (last 3)  No results found for this basename: GLUCAP:3 in the last 72 hours  Wt Readings from Last 3 Encounters:  09/28/11 53.9 kg (118 lb 13.3 oz)  09/25/11 54.432 kg (120 lb)  09/25/11 54.432 kg (120 lb)    Physical Exam:  General appearance: alert, cooperative, appears stated age and no distress Head: Normocephalic, without obvious abnormality, atraumatic Eyes: conjunctivae/corneas clear. PERRL, EOM's intact. Fundi benign. Ears: normal TM's and external ear canals both ears Nose: Nares normal. Septum midline. Mucosa normal. No drainage or sinus tenderness. Throat: lips, mucosa, and tongue normal; teeth and gums normal Neck: no adenopathy, no carotid bruit, no JVD, supple, symmetrical, trachea midline and thyroid not enlarged, symmetric, no tenderness/mass/nodules Back: symmetric, no curvature. ROM normal. No CVA tenderness. Resp: clear to auscultation bilaterally poor inspiratory effort overall. Cardio: regular rate and rhythm, S1, S2 normal, no murmur, click, rub or gallop GI: soft, non-tender; bowel sounds normal; no masses,  no organomegaly Extremities: extremities normal, atraumatic, no cyanosis or edema Pulses: 2+ and  symmetric Skin: Skin color, texture, turgor normal. No rashes or lesions Neurologic: Grossly normal. Pain inhibition weakness. Cognitively intact. CN normal Incision/Wound: wounds clean and intact with staples. allevyn dressing. Area appropriately tender   Assessment/Plan: 1. Functional deficits secondary to left intertrochanteric hip fx which require 3+ hours per day of interdisciplinary therapy in a comprehensive inpatient rehab setting. Physiatrist is providing close team supervision and 24 hour management of active medical problems listed below. Physiatrist and rehab team continue to assess barriers to discharge/monitor patient progress toward functional and medical goals. FIM:                   Comprehension Comprehension Mode: Auditory Comprehension: 6-Follows complex conversation/direction: With extra time/assistive device  Expression Expression Mode: Verbal Expression: 6-Expresses complex ideas: With extra time/assistive device  Social Interaction Social Interaction: 7-Interacts appropriately with others - No medications needed.  Problem Solving Problem Solving: 6-Solves complex problems: With extra time  Memory Memory: 6-Assistive device: No helper  1. left displaced intertrochanteric hip fracture. Status post closed reduction and internal fixation with intramedullary nail 09-25-11. Weightbearing as tolerated  2. DVT Prophylaxis/Anticoagulation: Subcutaneous Lovenox. Monitor platelet counts any signs of bleeding  3. Pain Management: Tramadol 50 mg every 6 hours as needed and Robaxin as needed. Monitor mental status   -add voltaren gel for OA in hands 4. postoperative anemia. Patient has been transfused. Latest hemoglobin 9.1. Followup CBC 9.0 today 5. Hypertension Norvasc 5 mg daily, Lotensin 20 mg daily, HCTZ 12.5 mg daily, Lopressor 100 mg twice daily. Good control so far. 6. Hypothyroidism. Synthroid  7. Hypokalemia. Supplement provided and  followup labs.   8. Klebsiella urinary tract infection. PO cipro 9. Hypomagnesia follow labs after transfusion 10. Low oxygen sats- encourage IS  -check cxr today -last one from 4/29 showed interstitial dz, wbc's slightly increased  -oxygen as needed  LOS (Days) 1 A FACE TO FACE EVALUATION WAS PERFORMED  Normal Recinos T 09/29/2011, 7:53 AM

## 2011-09-29 NOTE — Progress Notes (Signed)
Overall Plan of Care [facility]) Patient Details Name: KAIDYN HERNANDES MRN: [id number] DOB: April 22, 1916  Diagnosis:  Left intertrochanteric hip fx  Primary Diagnosis:    Intertrochanteric fracture of left hip Co-morbidities: htn, arthritis, anxiety  Functional Problem List  Patient demonstrates impairments in the following areas: Balance, Edema, Endurance, Pain and Safety  Basic ADL's: eating, grooming, bathing, dressing and toileting Advanced ADL's: simple meal preparation and light housekeeping  Transfers:  toilet and tub/shower Locomotion:  ambulation  Additional Impairments:  None  Anticipated Outcomes Item Anticipated Outcome  Eating/Swallowing  Mod I  Basic self-care  Overall Supervision except Min for LB dressing with AE  Tolieting  Overall Supervision  Bowel/Bladder    Transfers  Toilet - Supervision, Tub Bench - Min assist  Locomotion  Supervision to Mod I  Communication    Cognition    Pain  Managed within goal of less level 3  Safety/Judgment  Demonstrates good safety awareness and safe mobility,observes hip precautions  Other  Able to provide care for incisions and state to report to MD if noted immed.   Therapy Plan: PT Frequency: 2-3 X/day, 60-90 minutes OT Frequency: 1-2 X/day, 60-90 minutes     Team Interventions: Item RN PT OT SLP SW TR Other  Self Care/Advanced ADL Retraining   x      Neuromuscular Re-Education  x       Therapeutic Activities  x x      UE/LE Strength Training/ROM  x x      UE/LE Coordination Activities         Visual/Perceptual Remediation/Compensation         DME/Adaptive Equipment Instruction  x x      Therapeutic Exercise  x x      Balance/Vestibular Training  x x      Patient/Family Education  x x      Cognitive Remediation/Compensation         Functional Mobility Training  x       Ambulation/Gait Training  x       Corporate treasurer         Bladder Management         Bowel Management         Disease Management/Prevention         Pain Management  x x      Medication Management         Skin Care/Wound Management         Splinting/Orthotics         Discharge Planning  x x  x    Psychosocial Support  x x  x                       Team Discharge Planning: Destination:  Home Projected Follow-up:  OT and Home Health HHPT Projected Equipment Needs:  Tub Bench Patient/family involved in discharge planning:  Yes (daughter, Steward Drone from [place])  MD ELOS: 2 weeks Medical Rehab Prognosis:  Excellent Assessment: pt is admitted for cir therapies after her hip fx.  She has a supportive son who will provide assist at home. The patient was fairly active pta. Therapy will address fxnl mobility, orthopedic  issues, pain, adl's, safety, family ed with goals at supervision to mod I.

## 2011-09-29 NOTE — Progress Notes (Signed)
Physical Therapy Session Note  Patient Details  Name: Misty Shah MRN: [id number] Date of Birth: 1915/10/08  Today's Date: 09/29/2011 Time: 4098-1191 Time Calculation (min): 33 min  Short Term Goals: Week 1:  PT Short Term Goal 1 (Week 1): Pt will perform rolling Rt/Lt with supervision.  PT Short Term Goal 1 - Progress (Week 1): Progressing toward goal PT Short Term Goal 2 (Week 1): Pt will perform supine to sit with min assist and adaptive equipment as needed, min verbal cues for sequence.  PT Short Term Goal 2 - Progress (Week 1): Progressing toward goal PT Short Term Goal 3 (Week 1): Pt will perform sit <> stand with min-guard assist, min verbal cues for safety.  PT Short Term Goal 3 - Progress (Week 1): Progressing toward goal PT Short Term Goal 4 (Week 1): Pt will be able to perform static balance activities without UE support x 2 min with supervision.  PT Short Term Goal 4 - Progress (Week 1): Progressing toward goal PT Short Term Goal 5 (Week 1): Pt will ambulate 23' with RW with min assist.  PT Short Term Goal 5 - Progress (Week 1): Progressing toward goal  Skilled Therapeutic Interventions/Progress Updates:    Session primarily focused on gait training with RW increasing weight bearing on Lt. LE and improving Rt. Step length. Gait x 26' with RW, one seated rest. Verbal cues and facilitation to promoted improved Lt. Knee extension and stability during Lt. LE stance.  Pt slow to initiate movement as she is fatigued from the days activities.   Therapy Documentation Precautions:  Precautions Precautions: Fall Restrictions Weight Bearing Restrictions: Yes LLE Weight Bearing: Weight bearing as tolerated Vital Signs: Therapy Vitals Temp: 97.4 F (36.3 C) Temp src: Oral Pulse Rate: 70  BP: 133/4 mmHg Patient Position, if appropriate: Sitting Oxygen Therapy SpO2: 100 % O2 Device: None (Room air) Pain: Pain Assessment Pain Assessment: 0-10 Pain Score:   5 Pain Type: Acute  pain Pain Location: Hip Pain Orientation: Left Pain Descriptors: Aching Pain Onset: With Activity Pain Intervention(s): Repositioned (rest at end of session)    See FIM for current functional status  Therapy/Group: Individual Therapy  Wilhemina Bonito 09/29/2011, 5:02 PM

## 2011-09-30 DIAGNOSIS — W19XXXA Unspecified fall, initial encounter: Secondary | ICD-10-CM

## 2011-09-30 DIAGNOSIS — S72009A Fracture of unspecified part of neck of unspecified femur, initial encounter for closed fracture: Secondary | ICD-10-CM

## 2011-09-30 DIAGNOSIS — Z5189 Encounter for other specified aftercare: Secondary | ICD-10-CM

## 2011-09-30 MED ORDER — ALPRAZOLAM 0.25 MG PO TABS
0.2500 mg | ORAL_TABLET | Freq: Two times a day (BID) | ORAL | Status: DC | PRN
  Administered 2011-10-01 – 2011-10-12 (×9): 0.25 mg via ORAL
  Filled 2011-09-30 (×10): qty 1

## 2011-09-30 NOTE — Progress Notes (Signed)
Patient ID: Misty Shah, female   DOB: [birth year], 78 y.o.   MRN: [id number] Subjective/Complaints: Had panic attack about 4 am after she wet bed  Decreased oxygen sats this am-requiring oxygen [place] All other ROS negative.  Objective: Vital Signs: Blood pressure 155/71, pulse 101, temperature 98 F (36.7 C), temperature source Oral, resp. rate 18, height 5\' 2"  (1.575 m), weight 53.9 kg (118 lb 13.3 oz), SpO2 95.00%. Dg Chest 2 View  09/29/2011  *RADIOLOGY REPORT*  Clinical Data: decreased oxygen saturation  CHEST - 2 VIEW  Comparison: 09/25/2011  Findings: Heart size is normal.  The lungs are hyperinflated and there are coarsened interstitial markings compatible with COPD.  Small bilateral pleural effusions have increased from previous exam.  There is pulmonary venous congestion.  Multilevel spondylosis noted within the thoracic spine.  Chronic- appearing anterior wedge compression deformity is noted within the upper thoracic spine.  IMPRESSION:  1.  Interval development of bilateral pleural effusions and pulmonary venous congestion. 2.  COPD.  Original Report Authenticated By: Rosealee Albee, M.D.    Basename 09/29/11 0543 09/28/11 0455  WBC 13.0* 11.2*  HGB 9.0* 9.1*  HCT 26.8* 26.9*  PLT 139* 99*    Basename 09/29/11 0543 09/28/11 2000 09/28/11 0455  NA 133* -- 133*  K 3.9 4.0 --  CL 98 -- 97  CO2 27 -- 29  GLUCOSE 122* -- 116*  BUN 15 -- 11  CREATININE 0.71 -- 0.67  CALCIUM 8.2* -- 7.6*   CBG (last 3)  No results found for this basename: GLUCAP:3 in the last 72 hours  Wt Readings from Last 3 Encounters:  09/28/11 53.9 kg (118 lb 13.3 oz)  09/25/11 54.432 kg (120 lb)  09/25/11 54.432 kg (120 lb)    Physical Exam:  General appearance: alert, cooperative, appears stated age and no distress Head: Normocephalic, without obvious abnormality, atraumatic Eyes: conjunctivae/corneas clear. PERRL, EOM's intact. Fundi benign. Ears: normal TM's and external ear canals both  ears Nose: Nares normal. Septum midline. Mucosa normal. No drainage or sinus tenderness. Throat: lips, mucosa, and tongue normal; teeth and gums normal Neck: no adenopathy, no carotid bruit, no JVD, supple, symmetrical, trachea midline and thyroid not enlarged, symmetric, no tenderness/mass/nodules Back: symmetric, no curvature. ROM normal. No CVA tenderness. Resp: clear to auscultation bilaterally poor inspiratory effort overall. Cardio: regular rate and rhythm, S1, S2 normal, no murmur, click, rub or gallop GI: soft, non-tender; bowel sounds normal; no masses,  no organomegaly Extremities: extremities normal, atraumatic, no cyanosis or edema Pulses: 2+ and symmetric Skin: Skin color, texture, turgor normal. No rashes or lesions Neurologic: Grossly normal. Pain inhibition weakness. Cognitively intact. CN normal Incision/Wound: wounds clean and intact with staples. allevyn dressing. Area appropriately tender   Assessment/Plan: 1. Functional deficits secondary to left intertrochanteric hip fx which require 3+ hours per day of interdisciplinary therapy in a comprehensive inpatient rehab setting. Physiatrist is providing close team supervision and 24 hour management of active medical problems listed below. Physiatrist and rehab team continue to assess barriers to discharge/monitor patient progress toward functional and medical goals. FIM: FIM - Bathing Bathing Steps Patient Completed: Chest;Right Arm;Left Arm;Abdomen;Front perineal area;Buttocks;Right upper leg;Right lower leg (including foot) Bathing: 4: Min-Patient completes 8-9 52f 10 parts or 75+ percent  FIM - Upper Body Dressing/Undressing Upper body dressing/undressing steps patient completed: Thread/unthread right sleeve of front closure shirt/dress;Thread/unthread left sleeve of front closure shirt/dress;Pull shirt around back of front closure shirt/dress;Button/unbutton shirt Upper body dressing/undressing: 5: Set-up assist to: Obtain  clothing/put away FIM - Lower Body Dressing/Undressing Lower body dressing/undressing steps patient completed: Thread/unthread right underwear leg;Pull underwear up/down;Thread/unthread right pants leg;Pull pants up/down Lower body dressing/undressing: 2: Max-Patient completed 25-49% of tasks        FIM - Bed/Chair Transfer Bed/Chair Transfer: 2: Supine > Sit: Max A (lifting assist/Pt. 25-49%);1: Bed > Chair or W/C: Total A (helper does all/Pt. < 25%) (performed without device)  FIM - Locomotion: Wheelchair Locomotion: Wheelchair: 0: Activity did not occur FIM - Locomotion: Ambulation Locomotion: Ambulation Assistive Devices: Walker - Rolling (unable to perform without assistive device) Ambulation/Gait Assistance: 3: Mod assist Locomotion: Ambulation: 1: Travels less than 50 ft with moderate assistance (Pt: 50 - 74%)  Comprehension Comprehension Mode: Auditory Comprehension: 6-Follows complex conversation/direction: With extra time/assistive device  Expression Expression Mode: Verbal Expression: 6-Expresses complex ideas: With extra time/assistive device  Social Interaction Social Interaction: 6-Interacts appropriately with others with medication or extra time (anti-anxiety, antidepressant).  Problem Solving Problem Solving: 5-Solves complex 90% of the time/cues < 10% of the time  Memory Memory: 6-More than reasonable amt of time  1. left displaced intertrochanteric hip fracture. Status post closed reduction and internal fixation with intramedullary nail 09-25-11. Weightbearing as tolerated  2. DVT Prophylaxis/Anticoagulation: Subcutaneous Lovenox. Monitor platelet counts any signs of bleeding  3. Pain Management: Tramadol 50 mg every 6 hours as needed and Robaxin as needed. Monitor mental status   -add voltaren gel for OA in hands 4. postoperative anemia. Patient has been transfused. Latest hemoglobin 9.1. Followup CBC 9.0 today 5. Hypertension Norvasc 5 mg daily, Lotensin 20  mg daily, HCTZ 12.5 mg daily, Lopressor 100 mg twice daily. Good control so far. 6. Hypothyroidism. Synthroid  7. Hypokalemia. Supplement provided and followup labs.  8. Klebsiella urinary tract infection. 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 Hypomagnesia follow labs after transfusion 10. Low oxygen sats- encourage IS  -check cxr today -last one from 4/29 showed interstitial dz, wbc's slightly increased  -oxygen as needed  LOS (Days) 2 A FACE TO FACE EVALUATION WAS PERFORMED  Davan Hark E 09/30/2011, 7:44 AM

## 2011-09-30 NOTE — Progress Notes (Signed)
Physical Therapy Note  Patient Details  Name: Misty Shah MRN: [id number] Date of Birth: January 19, 1916 Today's Date: 09/30/2011  1000-1055 (55 minutes) individual Pain: 5/10 left hip/meds given  Oxygen sats > 92% on 1.5 L [place] Focus of treatment: Bed mobility training; therapeutic exercises to improve strength/functrional use of bilateral LEs; gait training Treatment: Pt requires mod assist sit ><supine (mat) with assist at LEs and trunk; transfers SPT with RW min assist for safety; bilateral ankle pumps, heel slides (AA on left), hip abduction (AA on left), ; supine to sit mod assist at trunk/LT LE; sit to stand X 10 from 19 inches close supervision (quad strengthening); gait 15 feet X 1 RW close supervision.    Glenda Kunst,JIM 09/30/2011, 10:46 AM

## 2011-09-30 NOTE — Progress Notes (Signed)
Occupational Therapy Session Note  Patient Details  Name: Misty Shah MRN: [id number] Date of Birth: [birth year]  Today's Date: 09/30/2011 Time: 0920-1015 Time Calculation (min): 55 min   Skilled Therapeutic Interventions/Progress Updates: ADL in room shower though she stated, "I am stiff and sore from yesterday's therapy.  So, I don't know if I can move or not."   Focus today on participation in spite of c/os stiffness and soreness to increase independence with self care, bed mobility and transfers.  Son present and encouraged his mother to participate.  Willingness to participate increased about 1/3 way through the session today without further c/os of being stiff and sore.     Therapy Documentation Precautions:  Precautions Precautions: Fall Restrictions Weight Bearing Restrictions: Yes LLE Weight Bearing: Weight bearing as tolerated  Pain: sore from yesterday's therapy in left lower  extremity   See FIM for current functional status  Therapy/Group: Individual Therapy  Bud Face [facility] 09/30/2011, 4:27 PM

## 2011-09-30 NOTE — Progress Notes (Signed)
Occupational Therapy Session Note  Patient Details  Name: Misty Shah MRN: [id number] Date of Birth: May 27, 1916  Today's Date: 09/30/2011 Time: 1300-1330 Time Calculation (min): 30 min  Short Term Goals: Week 1:  OT Short Term Goal 1 (Week 1): Bathe with Min assist in sit and stand using AE OT Short Term Goal 2 (Week 1): LB Dressing with Mod assist in sit and stand using AE OT Short Term Goal 3 (Week 1): Toileting and toilet Transfers Min assist OT Short Term Goal 4 (Week 1): Simple HM tasks with Min assist with RW  Skilled Therapeutic Interventions:  Session included sit>< stands, standing balance and tolerance, and use of reacher to pick up items off floor while also using RW.   Issued reacher bag and walker bag to improve independence with BADL/IADL tasks.  Patient reports that her family brought in her reacher from home yet she reports "it is too weak".  Family must have taken it home.  Patient thinks they might purchase her one that is easy for her arthritic hands to use.  Precautions:  Precautions Precautions: Fall Restrictions Weight Bearing Restrictions: Yes LLE Weight Bearing: Weight bearing as tolerated  Pain: No c/o pain  Therapy/Group: Individual Therapy  Roann Merk 09/30/2011, 5:31 PM

## 2011-09-30 NOTE — Progress Notes (Signed)
Physical Therapy Note  Patient Details  Name: Misty Shah MRN: [id number] Date of Birth: 1916/03/15 Today's Date: 09/30/2011  1500-1555 (55 minutes) group Pain : 4/10 left hip /premedicated Pt participated in PT group session focused on general LE strengthening; gait training/endurance Gait - 40 feet X 3 RW SBA WBAT LT LE; alternate hip flexion in standing using RW for support .   [name] Demeritt,JIM 09/30/2011, 4:02 PM

## 2011-10-01 NOTE — Progress Notes (Signed)
Patient ID: Misty Shah, female   DOB: 1916/01/08, 78 y.o.   MRN: [id number] Subjective/Complaints: Having a BM this am no c/os All other ROS negative.  Objective: Vital Signs: Blood pressure 147/60, pulse 86, temperature 98.3 F (36.8 C), temperature source Oral, resp. rate 16, height 5\' 2"  (1.575 m), weight 53.9 kg (118 lb 13.3 oz), SpO2 92.00%. Dg Chest 2 View  09/29/2011  *RADIOLOGY REPORT*  Clinical Data: decreased oxygen saturation  CHEST - 2 VIEW  Comparison: 09/25/2011  Findings: Heart size is normal.  The lungs are hyperinflated and there are coarsened interstitial markings compatible with COPD.  Small bilateral pleural effusions have increased from previous exam.  There is pulmonary venous congestion.  Multilevel spondylosis noted within the thoracic spine.  Chronic- appearing anterior wedge compression deformity is noted within the upper thoracic spine.  IMPRESSION:  1.  Interval development of bilateral pleural effusions and pulmonary venous congestion. 2.  COPD.  Original Report Authenticated By: Rosealee Albee, M.D.    Basename 09/29/11 0543  WBC 13.0*  HGB 9.0*  HCT 26.8*  PLT 139*    Basename 09/29/11 0543 09/28/11 2000  NA 133* --  K 3.9 4.0  CL 98 --  CO2 27 --  GLUCOSE 122* --  BUN 15 --  CREATININE 0.71 --  CALCIUM 8.2* --   CBG (last 3)  No results found for this basename: GLUCAP:3 in the last 72 hours  Wt Readings from Last 3 Encounters:  09/28/11 53.9 kg (118 lb 13.3 oz)  09/25/11 54.432 kg (120 lb)  09/25/11 54.432 kg (120 lb)    Physical Exam:  General appearance: alert, cooperative, appears stated age and no distress Head: Normocephalic, without obvious abnormality, atraumatic Eyes: conjunctivae/corneas clear. PERRL, EOM's intact. Fundi benign. Ears: normal TM's and external ear canals both ears Nose: Nares normal. Septum midline. Mucosa normal. No drainage or sinus tenderness. Throat: lips, mucosa, and tongue normal; teeth and gums normal Neck:  no adenopathy, no carotid bruit, no JVD, supple, symmetrical, trachea midline and thyroid not enlarged, symmetric, no tenderness/mass/nodules Back: symmetric, no curvature. ROM normal. No CVA tenderness. Resp: clear to auscultation bilaterally poor inspiratory effort overall. Cardio: regular rate and rhythm, S1, S2 normal, no murmur, click, rub or gallop GI: soft, non-tender; bowel sounds normal; no masses,  no organomegaly Extremities: extremities normal, atraumatic, no cyanosis or edema Pulses: 2+ and symmetric Skin: Skin color, texture, turgor normal. No rashes or lesions Neurologic: Grossly normal. Pain inhibition weakness. Cognitively intact. CN normal Incision/Wound: wounds clean and intact with staples. allevyn dressing. Area appropriately tender   Assessment/Plan: 1. Functional deficits secondary to left intertrochanteric hip fx which require 3+ hours per day of interdisciplinary therapy in a comprehensive inpatient rehab setting. Physiatrist is providing close team supervision and 24 hour management of active medical problems listed below. Physiatrist and rehab team continue to assess barriers to discharge/monitor patient progress toward functional and medical goals. FIM: FIM - Bathing Bathing Steps Patient Completed: Chest;Right Arm;Abdomen;Left Arm;Right upper leg;Left upper leg (in room shower; will be more Independ in shower with sponge) Bathing: 3: Mod-Patient completes 5-7 8f 10 parts or 50-74%  FIM - Upper Body Dressing/Undressing Upper body dressing/undressing steps patient completed: Thread/unthread left sleeve of front closure shirt/dress;Pull shirt around back of front closure shirt/dress;Button/unbutton shirt;Thread/unthread right sleeve of front closure shirt/dress Upper body dressing/undressing: 5: Set-up assist to: Obtain clothing/put away FIM - Lower Body Dressing/Undressing Lower body dressing/undressing steps patient completed: Thread/unthread right underwear  leg;Thread/unthread left underwear leg;Pull underwear  up/down;Thread/unthread right pants leg;Thread/unthread left pants leg;Pull pants up/down;Fasten/unfasten pants Lower body dressing/undressing: 3: Mod-Patient completed 50-74% of tasks  FIM - Toileting Toileting steps completed by patient: Performs perineal hygiene Toileting Assistive Devices: Grab bar or rail for support  FIM - Diplomatic Services operational officer Devices: Grab bars Toilet Transfers: 4-To toilet/BSC: Min A (steadying Pt. > 75%);4-From toilet/BSC: Min A (steadying Pt. > 75%)  FIM - Bed/Chair Transfer Bed/Chair Transfer Assistive Devices: Bed rails (stated, "I am stiff and sore from yesterday's therapy" ) Bed/Chair Transfer: 3: Supine > Sit: Mod A (lifting assist/Pt. 50-74%/lift 2 legs;3: Bed > Chair or W/C: Mod A (lift or lower assist)  FIM - Locomotion: Wheelchair Locomotion: Wheelchair: 1: Total Assistance/staff pushes wheelchair (Pt<25%) FIM - Locomotion: Ambulation Locomotion: Ambulation Assistive Devices: Designer, industrial/product Ambulation/Gait Assistance: 5: Supervision Locomotion: Ambulation: 1: Travels less than 50 ft with supervision/safety issues  Comprehension Comprehension Mode: Auditory Comprehension: 7-Follows complex conversation/direction: With no assist  Expression Expression Mode: Verbal Expression: 6-Expresses complex ideas: With extra time/assistive device  Social Interaction Social Interaction: 6-Interacts appropriately with others with medication or extra time (anti-anxiety, antidepressant).  Problem Solving Problem Solving: 5-Solves complex 90% of the time/cues < 10% of the time  Memory Memory: 6-More than reasonable amt of time  1. left displaced intertrochanteric hip fracture. Status post closed reduction and internal fixation with intramedullary nail 09-25-11. Weightbearing as tolerated  2. DVT Prophylaxis/Anticoagulation: Subcutaneous Lovenox. Monitor platelet counts any signs of  bleeding  3. Pain Management: Tramadol 50 mg every 6 hours as needed and Robaxin as needed. Monitor mental status   -add voltaren gel for OA in hands 4. postoperative anemia. Patient has been transfused. Latest hemoglobin 9.1. Followup CBC 9.0 today 5. Hypertension Norvasc 5 mg daily, Lotensin 20 mg daily, HCTZ 12.5 mg daily, Lopressor 100 mg twice daily. Good control so far. 6. Hypothyroidism. Synthroid  7. Hypokalemia. Supplement provided and followup labs.  8. 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 Hypomagnesia follow labs after transfusion 10. Low oxygen sats- encourage IS  -check cxr today -last one from 4/29 showed interstitial dz, wbc's slightly increased  -oxygen as needed  LOS (Days) 3 A FACE TO FACE EVALUATION WAS PERFORMED  Shakelia Scrivner E 10/01/2011, 8:57 AM

## 2011-10-01 NOTE — Progress Notes (Signed)
Physical Therapy Note  Patient Details  Name: Misty Shah MRN: [id number] Date of Birth: 11-Aug-1915 Today's Date: 10/01/2011  1300-1355 (55 minutes) group Pain: 5/10 left hip/premedicated Pt participated in PT group session focusing on bilateral LE strengthening/therapeutic activities to improve functional mobility - Transfers SPT with RW  SBA; sit to supine mod assist (bilateral LEs); supine to sit mod assist LT LE/trunk. Pt performed the following : bilateral heel slides (AA on left); bilateral hip abduction (AA on left); ankle pumps, quad sets; SAQs  (all X 20).   Wilfrido Luedke,JIM 10/01/2011, 1:57 PM

## 2011-10-02 DIAGNOSIS — S72009A Fracture of unspecified part of neck of unspecified femur, initial encounter for closed fracture: Secondary | ICD-10-CM

## 2011-10-02 DIAGNOSIS — W19XXXA Unspecified fall, initial encounter: Secondary | ICD-10-CM

## 2011-10-02 DIAGNOSIS — Z5189 Encounter for other specified aftercare: Secondary | ICD-10-CM

## 2011-10-02 MED ORDER — FUROSEMIDE 20 MG PO TABS
20.0000 mg | ORAL_TABLET | Freq: Every day | ORAL | Status: AC
  Administered 2011-10-02: 20 mg via ORAL
  Filled 2011-10-02: qty 1

## 2011-10-02 NOTE — Evaluation (Signed)
Recreational Therapy Assessment and Plan  Patient Details  Name: Misty Shah MRN: [id number] Date of Birth: Jun 13, 1915 Today's Date: 10/02/2011  Rehab Potential:  Good ELOS:  2 weeks  Assessment Clinical Impression: 78 year old right-handed white female admitted 09/25/2011 after a fall. Patient lives alone and uses a walker at times. Patient with multiple falls in the past. Sustained a left displaced intertrochanteric hip fracture. Underwent closed reduction and internal fixation with intramedullary nail 09/25/2011 per Dr. Cleophas Dunker. Weightbearing as tolerated. Patient transferred to CIR on 09/28/2011 .   Met with pt and discussed leisure/community pursuits.  Pt placed on HOLD for TR services at this time.  Will continue to monitor through team for future participation. 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 Expression Interests: Music (Comment) Other Leisure Interests: Television;Cooking/Baking;Reading Leisure Participation Style: Alone;With Family/Friends Awareness of Community Resources: Fair-identify 2 post discharge leisure resources Psychosocial / Spiritual Spiritual Interests: Church;Womens'Men's Groups Social interaction - Mood/Behavior: Cooperative Recreational Therapy Orientation Orientation -Reviewed with patient: Available activity resources Strengths/Weaknesses Patient weaknesses: Physical limitations;Minimal Premorbid Leisure Activity  Plan Rec Therapy Plan Is patient appropriate for Therapeutic Recreation?: No The above assessment, treatment plan, treatment alternatives and goals were discussed and mutually agreed upon: by patient  Dorlene Footman 10/02/2011, 4:10 PM

## 2011-10-02 NOTE — Progress Notes (Signed)
Patient information reviewed and entered into [facility] system by Tora Duck, RN, CRRN, PPS Coordinator.  Information including medical coding and functional independence measure will be reviewed and updated through discharge.     Per Jeanella Craze patient was given "Data Collection Information Summary for Patients in Inpatient Rehabilitation Facilities with attached "Privacy Act Statement-Health Care Records" upon admission 09/28/11.

## 2011-10-02 NOTE — Progress Notes (Addendum)
Physical Therapy Session Note  Patient Details  Name: Misty Shah MRN: [id number] Date of Birth: 18-Aug-1915  Today's Date: 10/02/2011 Time: 1000-1058 Time Calculation (min): 58 min  Short Term Goals: Week 1:  PT Short Term Goal 1 (Week 1): Pt will perform rolling Rt/Lt with supervision.  PT Short Term Goal 1 - Progress (Week 1): Progressing toward goal PT Short Term Goal 2 (Week 1): Pt will perform supine to sit with min assist and adaptive equipment as needed, min verbal cues for sequence.  PT Short Term Goal 2 - Progress (Week 1): Progressing toward goal PT Short Term Goal 3 (Week 1): Pt will perform sit <> stand with min-guard assist, min verbal cues for safety.  PT Short Term Goal 3 - Progress (Week 1): Progressing toward goal PT Short Term Goal 4 (Week 1): Pt will be able to perform static balance activities without UE support x 2 min with supervision.  PT Short Term Goal 4 - Progress (Week 1): Progressing toward goal PT Short Term Goal 5 (Week 1): Pt will ambulate 31' with RW with min assist.  PT Short Term Goal 5 - Progress (Week 1): Progressing toward goal  Skilled Therapeutic Interventions/Progress Updates:  [name] is limited in today's treatment session by recurring nausea.  Every time we get up to walk any distance, we have to stop shortly after and take a rest break due to nausea.  She stated she felt like this yesterday as well.  RN aware.  Gait with RW 15' x 3 min assist.  Transfers min assist for transitions and to stabilize RW, bed mobility min assist of left leg, mod verbal cues for technique, especially bridge to move bottom in bed.  There ex: sit to stand x 10, heel raises and toe raises bil x 10, LAQs bil x 10, bridges x 5 reps in bed.  Checked O2 sats on RA and patient dropped to 84%, returned 2L O2 [place] to nose and patient stated at 96%.  No DOE even when O2 sats dropped.    Therapy Documentation Precautions:  Precautions Precautions: Fall Restrictions Weight  Bearing Restrictions: Yes LLE Weight Bearing: Weight bearing as tolerated Vital Signs: Oxygen Therapy SpO2: 96 % O2 Device: Nasal cannula O2 Flow Rate (L/min): 2 L/min Pain: Pain Assessment Pain Assessment: 0-10 Pain Score:   5 Pain Type: Surgical pain Pain Location: Hip Pain Orientation: Left Pain Intervention(s): Repositioned;Ambulation/increased activity Locomotion : Ambulation Ambulation/Gait Assistance: 4: Min assist  See FIM for current functional status  Therapy/Group: Individual Therapy  Lurena Joiner B. Carnita Golob, PT, DPT [phone]   10/02/2011, 1:01 PM

## 2011-10-02 NOTE — Progress Notes (Signed)
Patient ID: Misty Shah, female   DOB: July 06, 1915, 78 y.o.   MRN: [id number] Patient ID: Misty Shah, female   DOB: May 25, 1916, 78 y.o.   MRN: [id number] Subjective/Complaints: Had a long day with multiple bm's yesterday (from laxatives)  Left leg more sore yesterday All other ROS negative.  Objective: Vital Signs: Blood pressure 148/68, pulse 89, temperature 98.2 F (36.8 C), temperature source Oral, resp. rate 18, height 5\' 2"  (1.575 m), weight 53.9 kg (118 lb 13.3 oz), SpO2 95.00%. No results found. No results found for this basename: WBC:2,HGB:2,HCT:2,PLT:2 in the last 72 hours No results found for this basename: NA:2,K:2,CL:2,CO2:2,GLUCOSE:2,BUN:2,CREATININE:2,CALCIUM:2 in the last 72 hours CBG (last 3)  No results found for this basename: GLUCAP:3 in the last 72 hours  Wt Readings from Last 3 Encounters:  09/28/11 53.9 kg (118 lb 13.3 oz)  09/25/11 54.432 kg (120 lb)  09/25/11 54.432 kg (120 lb)    Physical Exam:  General appearance: alert, cooperative, appears stated age and no distress Head: Normocephalic, without obvious abnormality, atraumatic Eyes: conjunctivae/corneas clear. PERRL, EOM's intact. Fundi benign. Ears: normal TM's and external ear canals both ears Nose: Nares normal. Septum midline. Mucosa normal. No drainage or sinus tenderness. Throat: lips, mucosa, and tongue normal; teeth and gums normal Neck: no adenopathy, no carotid bruit, no JVD, supple, symmetrical, trachea midline and thyroid not enlarged, symmetric, no tenderness/mass/nodules Back: symmetric, no curvature. ROM normal. No CVA tenderness. Resp: clear to auscultation bilaterally poor inspiratory effort overall. Cardio: regular rate and rhythm, S1, S2 normal, no murmur, click, rub or gallop GI: soft, non-tender; bowel sounds normal; no masses,  no organomegaly Extremities: extremities normal, atraumatic, no cyanosis or edema Pulses: 2+ and symmetric Skin: Skin color, texture, turgor normal. No rashes  or lesions Neurologic: Grossly normal. Pain inhibition weakness. Cognitively intact. CN normal Incision/Wound: wounds clean and intact with staples. allevyn dressing with minimal drainage. Area appropriately tender   Assessment/Plan: 1. Functional deficits secondary to left intertrochanteric hip fx which require 3+ hours per day of interdisciplinary therapy in a comprehensive inpatient rehab setting. Physiatrist is providing close team supervision and 24 hour management of active medical problems listed below. Physiatrist and rehab team continue to assess barriers to discharge/monitor patient progress toward functional and medical goals. FIM: FIM - Bathing Bathing Steps Patient Completed: Chest;Right Arm;Abdomen;Left Arm;Right upper leg;Left upper leg (in room shower; will be more Independ in shower with sponge) Bathing: 3: Mod-Patient completes 5-7 61f 10 parts or 50-74%  FIM - Upper Body Dressing/Undressing Upper body dressing/undressing steps patient completed: Thread/unthread left sleeve of front closure shirt/dress;Pull shirt around back of front closure shirt/dress;Button/unbutton shirt;Thread/unthread right sleeve of front closure shirt/dress Upper body dressing/undressing: 5: Set-up assist to: Obtain clothing/put away FIM - Lower Body Dressing/Undressing Lower body dressing/undressing steps patient completed: Thread/unthread right underwear leg;Thread/unthread left underwear leg;Pull underwear up/down;Thread/unthread right pants leg;Thread/unthread left pants leg;Pull pants up/down;Fasten/unfasten pants Lower body dressing/undressing: 3: Mod-Patient completed 50-74% of tasks  FIM - Toileting Toileting steps completed by patient: Performs perineal hygiene Toileting Assistive Devices: Grab bar or rail for support Toileting: 2: Max-Patient completed 1 of 3 steps  FIM - Diplomatic Services operational officer Devices: Grab bars Toilet Transfers: 4-To toilet/BSC: Min A (steadying  Pt. > 75%);4-From toilet/BSC: Min A (steadying Pt. > 75%)  FIM - Bed/Chair Transfer Bed/Chair Transfer Assistive Devices: Bed rails (stated, "I am stiff and sore from yesterday's therapy" ) Bed/Chair Transfer: 3: Supine > Sit: Mod A (lifting assist/Pt. 50-74%/lift 2 legs;3: Bed > Chair  or W/C: Mod A (lift or lower assist)  FIM - Locomotion: Wheelchair Locomotion: Wheelchair: 1: Total Assistance/staff pushes wheelchair (Pt<25%) FIM - Locomotion: Ambulation Locomotion: Ambulation Assistive Devices: Designer, industrial/product Ambulation/Gait Assistance: 5: Supervision Locomotion: Ambulation: 1: Travels less than 50 ft with supervision/safety issues  Comprehension Comprehension Mode: Auditory Comprehension: 7-Follows complex conversation/direction: With no assist  Expression Expression Mode: Verbal Expression: 6-Expresses complex ideas: With extra time/assistive device  Social Interaction Social Interaction: 6-Interacts appropriately with others with medication or extra time (anti-anxiety, antidepressant).  Problem Solving Problem Solving: 5-Solves complex 90% of the time/cues < 10% of the time  Memory Memory: 6-More than reasonable amt of time  1. left displaced intertrochanteric hip fracture. Status post closed reduction and internal fixation with intramedullary nail 09-25-11. Weightbearing as tolerated  2. DVT Prophylaxis/Anticoagulation: Subcutaneous Lovenox. Monitor platelet counts any signs of bleeding  3. Pain Management: Tramadol 50 mg every 6 hours as needed and Robaxin as needed. Monitor mental status   -add voltaren gel for OA in hands  -pain likely from increased activities in therapy- reassured pt 4. postoperative anemia. Patient has been transfused. Latest hemoglobin 9.1. Followup CBC 9.0 today 5. Hypertension Norvasc 5 mg daily, Lotensin 20 mg daily, HCTZ 12.5 mg daily, Lopressor 100 mg twice daily. Good control so far. 6. Hypothyroidism. Synthroid  7. Hypokalemia. Supplement  provided and followup labs.  8. 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 Hypomagnesia follow labs after transfusion 10. Low oxygen sats- encourage IS  -mild pulmonary congestion and bilateral pleural effusions on fridays' cxr  -will give her a dose of lasix today.  -recheck cxr on tuesday  -oxygen as needed  LOS (Days) 4 A FACE TO FACE EVALUATION WAS PERFORMED  Quantasia Stegner T 10/02/2011, 8:07 AM

## 2011-10-02 NOTE — Progress Notes (Signed)
Occupational Therapy Session Notes  Patient Details  Name: Misty Shah MRN: [id number] Date of Birth: 10-30-1915  Today's Date: 10/02/2011  Short Term Goals: Week 1:  OT Short Term Goal 1 (Week 1): Bathe with Min assist in sit and stand using AE OT Short Term Goal 2 (Week 1): LB Dressing with Mod assist in sit and stand using AE OT Short Term Goal 3 (Week 1): Toileting and toilet Transfers Min assist OT Short Term Goal 4 (Week 1): Simple HM tasks with Min assist with RW  Precautions:  Precautions Precautions: Fall Restrictions Weight Bearing Restrictions: Yes LLE Weight Bearing: Weight bearing as tolerated  First Session: Time: 7829-5621 Time Calculation (min): 60 min  Skilled Therapeutic Interventions/Progress Updates:  Self care retraining to include bed mobility, bed>BSC>W/C transfers, sponge bath (declined shower), dress and groom.  Patient reporting nausea and "just not feeling right".  Patient with decreased  problem solving, organizing, and memory this morning.  Unit secretary to relay this to her RN.  Focus session on activity tolerance, techniques for mobility, and use of reacher.  Pain: No c/o pain, reports nausea and RN alerted.  See FIM for current functional status  Therapy/Group: Individual Therapy  ---------------------------------------------------------------------- Second session: Time: 11:15-12:00 Time Calculation (min):  45 min  Pain Assessment: No c/o pain  Skilled Therapeutic Interventions: Discussed discharge planning with patient and son.  Son reports he plans cut off the legs of the patient's bed so her feet touch the ground when she sits on the EOB.  We placed the hospital bed at the height of patient's bed at home and practiced w/c><bed stand pivot transfers and patient able to perform task of on and off bed with supervision-steady assist of RW.  Son realizes that he will not have to lower her bed!  Patient ambulated with RW to bathroom for toilet  transfer and toileting.  Patient relies heavily on the grab bar on the right for sit><stand.  At home she has an elevated seat with arm rests to improve her independence.  See FIM for current functional status  Therapy/Group: Individual Therapy and family education with son, Misty Shah.  Misty Shah 10/02/2011, 12:47 PM

## 2011-10-02 NOTE — Progress Notes (Signed)
Occupational Therapy Session Note  Patient Details  Name: Misty Shah MRN: [id number] Date of Birth: 08/11/1915  Today's Date: 10/02/2011 Time: 1335-1400 Time Calculation (min): 25 min  Skilled Therapeutic Interventions/Progress Updates:    Focus on functional mobility @ bathroom level with RW. Ambulated @ 15 ft into bathroom and transferred onto tub bench, but stated that she was too tired to complete transfer. Son was present. Discussed home set up with son and stated that it would be likely that his mother would need someone to be with her in the pm and during bathing. He stated that they would be able to provide the assistance. O2 sats ranged from 87 - 91 on RA. @ 92 when finished session. Left on RA. Told nsg to spot check pt . Making steady progress.  Therapy Documentation Precautions:  Precautions Precautions: Fall Restrictions Weight Bearing Restrictions: Yes LLE Weight Bearing: Weight bearing as tolerated   Pain: 5   ADL:   See FIM for current functional status  Therapy/Group: Individual Therapy  Anfernee Peschke,HILLARY 10/02/2011, 4:08 PM [facility], OTR/L  [phone] 10/02/2011

## 2011-10-03 ENCOUNTER — Inpatient Hospital Stay (HOSPITAL_COMMUNITY): Payer: Medicare Other

## 2011-10-03 DIAGNOSIS — Z5189 Encounter for other specified aftercare: Secondary | ICD-10-CM

## 2011-10-03 DIAGNOSIS — W19XXXA Unspecified fall, initial encounter: Secondary | ICD-10-CM

## 2011-10-03 DIAGNOSIS — S72009A Fracture of unspecified part of neck of unspecified femur, initial encounter for closed fracture: Secondary | ICD-10-CM

## 2011-10-03 LAB — CBC
MCV: 87.4 fL (ref 78.0–100.0)
Platelets: 265 10*3/uL (ref 150–400)
RBC: 2.94 MIL/uL — ABNORMAL LOW (ref 3.87–5.11)
RDW: 14.4 % (ref 11.5–15.5)
WBC: 9.4 10*3/uL (ref 4.0–10.5)

## 2011-10-03 LAB — BASIC METABOLIC PANEL
CO2: 27 mEq/L (ref 19–32)
Calcium: 8.1 mg/dL — ABNORMAL LOW (ref 8.4–10.5)
Chloride: 92 mEq/L — ABNORMAL LOW (ref 96–112)
Creatinine, Ser: 0.5 mg/dL (ref 0.50–1.10)
GFR calc Af Amer: >90 mL/min (ref >90)
Sodium: 128 mEq/L — ABNORMAL LOW (ref 135–145)

## 2011-10-03 NOTE — Progress Notes (Signed)
Physical Therapy Session Note  Patient Details  Name: Misty Shah MRN: [id number] Date of Birth: 12/27/1915  Today's Date: 10/03/2011 Time: 4098-1191 Time Calculation (min): 10 min  Short Term Goals: Week 1:  PT Short Term Goal 1 (Week 1): Pt will perform rolling Rt/Lt with supervision.  PT Short Term Goal 1 - Progress (Week 1): Progressing toward goal PT Short Term Goal 2 (Week 1): Pt will perform supine to sit with min assist and adaptive equipment as needed, min verbal cues for sequence.  PT Short Term Goal 2 - Progress (Week 1): Progressing toward goal PT Short Term Goal 3 (Week 1): Pt will perform sit <> stand with min-guard assist, min verbal cues for safety.  PT Short Term Goal 3 - Progress (Week 1): Progressing toward goal PT Short Term Goal 4 (Week 1): Pt will be able to perform static balance activities without UE support x 2 min with supervision.  PT Short Term Goal 4 - Progress (Week 1): Progressing toward goal PT Short Term Goal 5 (Week 1): Pt will ambulate 57' with RW with min assist.  PT Short Term Goal 5 - Progress (Week 1): Progressing toward goal  Skilled Therapeutic Interventions/Progress Updates:    Limited session as transport arrived to take pt to xray. Practiced bed mobility, rolling Rt. With min assist and Rt. sidelying to sit with min/mod assist to fully reach upright sitting position, pt required use of rail. Verbal cues for pain modulation and sequence. Sit <>stand with min-guard assist, stand pivot transfer with min-guard assist and RW. Verbal cues for safe hand positioning.   Therapy Documentation Precautions:  Precautions Precautions: Fall Restrictions Weight Bearing Restrictions: Yes LLE Weight Bearing: Weight bearing as tolerated General: Amount of Missed PT Time (min): 50 Minutes Missed Time Reason: X-Ray Vital Signs: Therapy Vitals Temp: 98 F (36.7 C) Temp src: Oral Pulse Rate: 74  Resp: 16  BP: 119/66 mmHg Patient Position, if appropriate:  Lying Oxygen Therapy SpO2: 95 % O2 Device: Nasal cannula O2 Flow Rate (L/min): 2 L/min Pain: Pain Assessment Pain Assessment: 0-10 Pain Score:   1 Pain Type: Surgical pain Pain Location: Hip Pain Orientation: Left Pain Descriptors: Burning Pain Onset: With Activity Pain Intervention(s): Repositioned  See FIM for current functional status  Therapy/Group: Individual Therapy  Wilhemina Bonito 10/03/2011, 8:46 AM

## 2011-10-03 NOTE — Progress Notes (Signed)
Subjective/Complaints: Feels better today. Sweated a lot in bed last night because room was too hot. Breathing is comfortable this am All other ROS negative.  Objective: Vital Signs: Blood pressure 119/66, pulse 74, temperature 98 F (36.7 C), temperature source Oral, resp. rate 16, height 5\' 2"  (1.575 m), weight 53.9 kg (118 lb 13.3 oz), SpO2 95.00%. No results found.  Basename 10/03/11 0609  WBC 9.4  HGB 8.6*  HCT 25.7*  PLT 265    Basename 10/03/11 0609  NA 128*  K 3.7  CL 92*  CO2 27  GLUCOSE 88  BUN 15  CREATININE 0.50  CALCIUM 8.1*   CBG (last 3)  No results found for this basename: GLUCAP:3 in the last 72 hours  Wt Readings from Last 3 Encounters:  09/28/11 53.9 kg (118 lb 13.3 oz)  09/25/11 54.432 kg (120 lb)  09/25/11 54.432 kg (120 lb)    Physical Exam:  General appearance: alert, cooperative, appears stated age and no distress Head: Normocephalic, without obvious abnormality, atraumatic Eyes: conjunctivae/corneas clear. PERRL, EOM's intact. Fundi benign. Ears: normal TM's and external ear canals both ears Nose: Nares normal. Septum midline. Mucosa normal. No drainage or sinus tenderness. Throat: lips, mucosa, and tongue normal; teeth and gums normal Neck: no adenopathy, no carotid bruit, no JVD, supple, symmetrical, trachea midline and thyroid not enlarged, symmetric, no tenderness/mass/nodules Back: symmetric, no curvature. ROM normal. No CVA tenderness. Resp: clear to auscultation bilaterally poor inspiratory effort overall. Cardio: regular rate and rhythm, S1, S2 normal, no murmur, click, rub or gallop GI: soft, non-tender; bowel sounds normal; no masses,  no organomegaly Extremities: extremities normal, atraumatic, no cyanosis or edema Pulses: 2+ and symmetric Skin: Skin color, texture, turgor normal. No rashes or lesions Neurologic: Grossly normal. Pain inhibition weakness. Cognitively intact. CN normal Incision/Wound: wounds clean and intact with  staples. allevyn dressing with minimal drainage. Area appropriately tender   Assessment/Plan: 1. Functional deficits secondary to left intertrochanteric hip fx which require 3+ hours per day of interdisciplinary therapy in a comprehensive inpatient rehab setting. Physiatrist is providing close team supervision and 24 hour management of active medical problems listed below. Physiatrist and rehab team continue to assess barriers to discharge/monitor patient progress toward functional and medical goals. FIM: FIM - Bathing Bathing Steps Patient Completed: Chest;Right Arm;Abdomen;Left Arm;Right upper leg;Front perineal area (in room shower; will be more Independ in shower with sponge) Bathing: 3: Mod-Patient completes 5-7 58f 10 parts or 50-74%  FIM - Upper Body Dressing/Undressing Upper body dressing/undressing steps patient completed: Thread/unthread left sleeve of front closure shirt/dress;Pull shirt around back of front closure shirt/dress;Button/unbutton shirt;Thread/unthread right sleeve of front closure shirt/dress;Thread/unthread right sleeve of pullover shirt/dresss;Thread/unthread left sleeve of pullover shirt/dress;Put head through opening of pull over shirt/dress;Pull shirt over trunk Upper body dressing/undressing: 5: Set-up assist to: Obtain clothing/put away FIM - Lower Body Dressing/Undressing Lower body dressing/undressing steps patient completed: Thread/unthread right pants leg;Thread/unthread left pants leg;Fasten/unfasten pants Lower body dressing/undressing: 2: Max-Patient completed 25-49% of tasks  FIM - Toileting Toileting steps completed by patient: Performs perineal hygiene Toileting Assistive Devices: Grab bar or rail for support Toileting: 2: Max-Patient completed 1 of 3 steps  FIM - Diplomatic Services operational officer Devices: Grab bars;Elevated toilet seat;Walker Toilet Transfers: 4-From toilet/BSC: Min A (steadying Pt. > 75%);3-To toilet/BSC: Mod A (lift or  lower assist)  FIM - Bed/Chair Transfer Bed/Chair Transfer Assistive Devices: Bed rails (stated, "I am stiff and sore from yesterday's therapy" ) Bed/Chair Transfer: 4: Sit > Supine: Min A (  steadying pt. > 75%/lift 1 leg);4: Bed > Chair or W/C: Min A (steadying Pt. > 75%);4: Chair or W/C > Bed: Min A (steadying Pt. > 75%);5: Supine > Sit: Supervision (verbal cues/safety issues)  FIM - Locomotion: Wheelchair Locomotion: Wheelchair: 0: Activity did not occur FIM - Locomotion: Ambulation Locomotion: Ambulation Assistive Devices: Designer, industrial/product Ambulation/Gait Assistance: 4: Min assist Locomotion: Ambulation: 1: Travels less than 50 ft with minimal assistance (Pt.>75%)  Comprehension Comprehension Mode: Auditory Comprehension: 7-Follows complex conversation/direction: With no assist  Expression Expression Mode: Verbal Expression: 6-Expresses complex ideas: With extra time/assistive device  Social Interaction Social Interaction: 6-Interacts appropriately with others with medication or extra time (anti-anxiety, antidepressant).  Problem Solving Problem Solving: 5-Solves complex 90% of the time/cues < 10% of the time  Memory Memory: 6-More than reasonable amt of time  1. left displaced intertrochanteric hip fracture. Status post closed reduction and internal fixation with intramedullary nail 09-25-11. Weightbearing as tolerated  2. DVT Prophylaxis/Anticoagulation: Subcutaneous Lovenox. Monitor platelet counts any signs of bleeding  3. Pain Management: Tramadol 50 mg every 6 hours as needed and Robaxin as needed. Monitor mental status   -add voltaren gel for OA in hands  -pain likely from increased activities in therapy- reassured pt 4. postoperative anemia. Patient has been transfused. hgb dropped a little- follow up later in the week. 5. Hypertension Norvasc 5 mg daily, Lotensin 20 mg daily, HCTZ 12.5 mg daily, Lopressor 100 mg twice daily. Good control so far. 6. Hypothyroidism.  Synthroid  7. Hypokalemia. Supplement provided and followup labs.  8. Klebsiella urinary tract infection.sensitive to PO cipro started 5/2 9. Hypomagnesia follow labs after transfusion 10. Low oxygen sats- encourage IS  -mild pulmonary congestion and bilateral pleural effusions on fridays' cxr  -hold lasix given sodium level (129)  -recheck cxr on tuesday  -oxygen as needed  LOS (Days) 5 A FACE TO FACE EVALUATION WAS PERFORMED  Epifanio Labrador T 10/03/2011, 8:04 AM

## 2011-10-03 NOTE — Progress Notes (Signed)
Physical Therapy Session Note  Patient Details  Name: Misty Shah MRN: [id number] Date of Birth: 1916/03/19  Today's Date: 10/03/2011 Time: 1115-1200 Time Calculation (min): 45 min  Short Term Goals: Week 1:  PT Short Term Goal 1 (Week 1): Pt will perform rolling Rt/Lt with supervision.  PT Short Term Goal 1 - Progress (Week 1): Progressing toward goal PT Short Term Goal 2 (Week 1): Pt will perform supine to sit with min assist and adaptive equipment as needed, min verbal cues for sequence.  PT Short Term Goal 2 - Progress (Week 1): Progressing toward goal PT Short Term Goal 3 (Week 1): Pt will perform sit <> stand with min-guard assist, min verbal cues for safety.  PT Short Term Goal 3 - Progress (Week 1): Progressing toward goal PT Short Term Goal 4 (Week 1): Pt will be able to perform static balance activities without UE support x 2 min with supervision.  PT Short Term Goal 4 - Progress (Week 1): Progressing toward goal PT Short Term Goal 5 (Week 1): Pt will ambulate 72' with RW with min assist.  PT Short Term Goal 5 - Progress (Week 1): Progressing toward goal  Skilled Therapeutic Interventions/Progress Updates: Treatment focused on functional mobility training with stairs for home entry and general strengthening, increasing WB on LLE up/down 3 steps (with 4" steps) initially mod A due to decreased WB on LLE progressed to min A with second repetition. Sit to stands for functional strengthening x 5 reps with emphasis on technique and hand placement, min A to supervision with repetition. Gait with RW , cueing for gait pattern and posture x 20' with min A.     Therapy Documentation Precautions:  Precautions Precautions: Fall Restrictions Weight Bearing Restrictions: Yes LLE Weight Bearing: Weight bearing as tolerated    Pain: No complaints   Locomotion : Ambulation Ambulation/Gait Assistance: 4: Min assist   See FIM for current functional status  Therapy/Group: Individual  Therapy  [name] [facility] 10/03/2011, 12:13 PM

## 2011-10-03 NOTE — Patient Care Conference (Signed)
Inpatient RehabilitationTeam Conference Note Date: 10/03/2011   Time: 2:05 PM    Patient Name: Misty Shah      Medical Record Number: [id number]  Date of Birth: 10-08-1915 Sex: Female         Room/Bed: 4011/4011-01 Payor Info: Payor: MEDICARE  Plan: MEDICARE PART A AND B  Product Type: *No Product type*     Admitting Diagnosis: LT HIP FX, IM NAILING  Admit Date/Time:  09/28/2011  6:31 PM Admission Comments: No comment available   Primary Diagnosis:  Intertrochanteric fracture of left hip Principal Problem: Intertrochanteric fracture of left hip  Patient Active Problem List  Diagnoses Date Noted  . Intertrochanteric fracture of left hip 09/29/2011  . Hip fracture 09/25/2011  . Hypertension 09/25/2011  . Osteoporosis 09/25/2011  . Hypothyroidism 09/25/2011  . UTI (lower urinary tract infection) 09/25/2011    Expected Discharge Date: Expected Discharge Date: 10/13/11  Team Members Present: Physician: Dr. Faith Rogue Case Manager Present: Lutricia Horsfall, RN Social Worker Present: Dossie Der, LCSW Philbert Riser, RN PT Present: [name], PT OT Present: Edwin Cap, OT SLP Present: Feliberto Gottron, SLP Tora Duck, RN, PPS Coord    Current Status/Progress Goal Weekly Team Focus  Medical   pain issues, decreased activity tolerance, desats likely due to fluid overload  improved pain and activity tolerance  see above   Bowel/Bladder   Continent bowel and bladder.  Continent bowel and bladder.  Monitor.   Swallow/Nutrition/ Hydration             ADL's   supervision with UB ADLs, min assist with grooming tasks, min assist with ADL transfers,max assist with LB ADLs  overall supervision -> min assist  ADL retraining, sit/stands, overall activity tolerance/endurance, ADL transfers   Mobility   min A, limited by nausea  mod I/supervision  Improved bed mobility, increase gait, improve balance.   Communication             Safety/Cognition/ Behavioral Observations  No  unsafe behaviors reported.  To keep patient safe.  Monitor for unsafe behaviors.   Pain   Voltaren QID rt hand due to arthritis.  Tylenol 650 mg q 4 hrs mild pain.  Ultram 50 mg q 6 hrs prn mod pain.  To keep pain level <3.  Continue present plan and monitor for effectiveness.   Skin   Staples intact lt hip/thigh with Allevyn dsgs intact. RUE bruising.   To prevent any skin breakdown.  Monitor surgical sites for s/s infection.  Monitor skin for breakdown.        *See Interdisciplinary Assessment and Plan and progress notes for long and short-term goals  Barriers to Discharge: activity tolerance    Possible Resolutions to Barriers:  pacing, pain mgt    Discharge Planning/Teaching Needs:  HOme with son providing care.  has been here daily to observe in therapies.      Team Discussion:  Pt with hx of falls PTA. Poor endurance but participating. Using O2 at 2 L. Desats to 80s with activity. S goals. Discussion of d/c plan.   Revisions to Treatment Plan:  none   Continued Need for Acute Rehabilitation Level of Care: The patient requires daily medical management by a physician with specialized training in physical medicine and rehabilitation for the following conditions: Daily direction of a multidisciplinary physical rehabilitation program to ensure safe treatment while eliciting the highest outcome that is of practical value to the patient.: Yes Daily medical management of patient stability for increased activity during  participation in an intensive rehabilitation regime.: Yes Daily analysis of laboratory values and/or radiology reports with any subsequent need for medication adjustment of medical intervention for : Post surgical problems;Neurological problems  Meryl Dare 10/03/2011, 3:17 PM

## 2011-10-03 NOTE — Progress Notes (Signed)
Occupational Therapy Session Notes  Patient Details  Name: Misty Shah MRN: [id number] Date of Birth: November 17, 1915  Today's Date: 10/03/2011  Short Term Goals: Week 1:  OT Short Term Goal 1 (Week 1): Bathe with Min assist in sit and stand using AE OT Short Term Goal 2 (Week 1): LB Dressing with Mod assist in sit and stand using AE OT Short Term Goal 3 (Week 1): Toileting and toilet Transfers Min assist OT Short Term Goal 4 (Week 1): Simple HM tasks with Min assist with RW  Skilled Therapeutic Interventions/Progress Updates:  Session #1 [id number] - 60 Minutes Individual Therapy No complaints of pain Upon entering room patient seated in w/c. Engaged in ADL retraining at sink level. Patient at a min assist level for UB/LB bathing (plan to introduce long handled sponge to increase independence with bathing feet). Introduced, educated, demonstrated, and had patient return demonstrate use of reacher and sock aid to help increase independence with LB dressing. Focused skilled intervention on sit/stands, basic self-care tasks, overall activity tolerance/endurance, and grooming tasks. Left patient seated in w/c with call bell & phone within reach.  Session #2 1330-1400 - 30 Minutes Individual Therapy No complaints of pain Upon entering room patient seated in w/c. Engaged in functional ambulation using rolling walker <-> bathroom for toilet transfer on/off elevated toilet seat; min assist on/mod assist off. Patient then stood at sink for grooming task of washing hands. Assisted patient back to w/c and focused on PROM to bilateral UEs/shoulders. Patient with poor passive & active ROM throughout shoulders and subluxation on left shoulder.  Precautions:  Precautions Precautions: Fall Restrictions Weight Bearing Restrictions: Yes LLE Weight Bearing: Weight bearing as tolerated  See FIM for current functional status  Shanquita Ronning 10/03/2011, 10:41 AM

## 2011-10-03 NOTE — Care Management Note (Signed)
Met with pt and her son to report on team conference. Both in agreement with goals and d/c date of May 17. Son aware of need for 24/7 supervision and agrees to come for education prior to d/c.

## 2011-10-04 DIAGNOSIS — S72009A Fracture of unspecified part of neck of unspecified femur, initial encounter for closed fracture: Secondary | ICD-10-CM

## 2011-10-04 DIAGNOSIS — Z5189 Encounter for other specified aftercare: Secondary | ICD-10-CM

## 2011-10-04 DIAGNOSIS — W19XXXA Unspecified fall, initial encounter: Secondary | ICD-10-CM

## 2011-10-04 LAB — CBC
Platelets: 298 10*3/uL (ref 150–400)
RBC: 2.96 MIL/uL — ABNORMAL LOW (ref 3.87–5.11)
RDW: 14.6 % (ref 11.5–15.5)
WBC: 10.7 10*3/uL — ABNORMAL HIGH (ref 4.0–10.5)

## 2011-10-04 LAB — BASIC METABOLIC PANEL
CO2: 30 mEq/L (ref 19–32)
Chloride: 92 mEq/L — ABNORMAL LOW (ref 96–112)
GFR calc Af Amer: 87 mL/min — ABNORMAL LOW (ref >90)
Potassium: 3.3 mEq/L — ABNORMAL LOW (ref 3.5–5.1)

## 2011-10-04 MED ORDER — POTASSIUM CHLORIDE CRYS ER 20 MEQ PO TBCR
20.0000 meq | EXTENDED_RELEASE_TABLET | Freq: Two times a day (BID) | ORAL | Status: DC
  Administered 2011-10-04 – 2011-10-13 (×19): 20 meq via ORAL
  Filled 2011-10-04 (×23): qty 1

## 2011-10-04 MED ORDER — LEVOFLOXACIN 250 MG PO TABS
250.0000 mg | ORAL_TABLET | Freq: Every day | ORAL | Status: DC
  Administered 2011-10-04 – 2011-10-05 (×2): 250 mg via ORAL
  Filled 2011-10-04 (×4): qty 1

## 2011-10-04 NOTE — Progress Notes (Signed)
Occupational Therapy Session Note  Patient Details  Name: Misty Shah MRN: [id number] Date of Birth: [birth year]  Today's Date: 10/04/2011 Time: 1000-1045 Time Calculation (min): 45 min  Short Term Goals: Week 1:  OT Short Term Goal 1 (Week 1): Bathe with Min assist in sit and stand using AE OT Short Term Goal 2 (Week 1): LB Dressing with Mod assist in sit and stand using AE OT Short Term Goal 3 (Week 1): Toileting and toilet Transfers Min assist OT Short Term Goal 4 (Week 1): Simple HM tasks with Min assist with RW  Skilled Therapeutic Interventions/Progress Updates:  No complaints of pain Treatment emphasis on ADL retraining at sink level. Focused skilled intervention on overall activity tolerance/endurance, UB/LB bathing & dressing, sit/stands, dynamic standing balance/tolerance/endurance, use of AE for LB ADLs to help increase independence. Left patient seated in w/c beside bed with call bell and phone within reach.   Precautions:  Precautions Precautions: Fall Restrictions Weight Bearing Restrictions: No LLE Weight Bearing: Weight bearing as tolerated  See FIM for current functional status  Therapy/Group: Individual Therapy  Can Lucci 10/04/2011, 12:22 PM

## 2011-10-04 NOTE — Progress Notes (Addendum)
Physical Therapy Session Note  Patient Details  Name: Misty Shah MRN: [id number] Date of Birth: 24-Jun-1915  Today's Date: 10/04/2011 Time:  8:17-9:15 ( )  Short Term Goals: Week 1:  PT Short Term Goal 1 (Week 1): Pt will perform rolling Rt/Lt with supervision.  PT Short Term Goal 1 - Progress (Week 1): Progressing toward goal PT Short Term Goal 2 (Week 1): Pt will perform supine to sit with min assist and adaptive equipment as needed, min verbal cues for sequence.  PT Short Term Goal 2 - Progress (Week 1): Progressing toward goal PT Short Term Goal 3 (Week 1): Pt will perform sit <> stand with min-guard assist, min verbal cues for safety.  PT Short Term Goal 3 - Progress (Week 1): Progressing toward goal PT Short Term Goal 4 (Week 1): Pt will be able to perform static balance activities without UE support x 2 min with supervision.  PT Short Term Goal 4 - Progress (Week 1): Progressing toward goal PT Short Term Goal 5 (Week 1): Pt will ambulate 76' with RW with min assist.  PT Short Term Goal 5 - Progress (Week 1): Progressing toward goal  Skilled Therapeutic Interventions/Progress Updates:  Pt had just gotten back in bed after using 3-in-1 and requested a rest, so initiated therapy with supine therex for LE strengthening and ROM. Tx focused on bed mobility training, tranfers training, and gait with RW.      Therapy Documentation Precautions:  Precautions Precautions: Fall Restrictions Weight Bearing Restrictions: No LLE Weight Bearing: Weight bearing as tolerated General:   Vital Signs: Therapy Vitals Temp: 99 F (37.2 C) Temp src: Oral Pulse Rate: 80  Resp: 18  BP: 124/52 mmHg Patient Position, if appropriate: Lying Oxygen Therapy SpO2: 98 % O2 Device: Nasal cannula O2 Flow Rate (L/min): 2 L/min Pain: 5/10, nursing came to give pt pain meds   Mobility:  Rolling L with Min A and cues for hand placement and R LE placement. Min A for trunk  Attempted supine>sit  from longsit as well as partial sidelying position. Pt required Mod A for both techniques for lifting trunk due to UE weakness.  Sit<>stand x5 with UE, progressing from Min A to close S.     Locomotion :  Standing weight-shifts in RW R/L to increase L weight-bearing tolerance. Gait training with RW and min-guard A 1x12, distance limited due to fatigue. Pt ambulates with step-to pattern and decreased L knee control in stance, but is able to improve knee control with cues.        Balance: Pt sat EOB without EU support while donning TED hose with no LOB, but definite R lean due to L hip pain. Pt stood edge of sink while grooming with occasional 1 UE assist x4min with close S.    Exercises: Supine therex: AP, QS, GS all bil x20. Heel slides and hip ABD L with AAROM 2x10. Seated LAQ bil 2x10 with AAROM on L due to weakenss.   2nd tx:  13:03-14:00 ( )  Pt with no c/o pain at this time.  Pt needed encouragement to participate due to fatigue. Pt generally with decreased pace of mobility and increased time required for all tasks.  Tx focused on WC and gait training, therex for UE and LE strengthening, and standing tolerance.  Pt instructed in WC propulsion and was able to self-propell x75' for UE strengthening with Mod A for pushing.  Gait training 3x25' with RW and close S with cues for equal step length  as able.  Sit<>stand x5 with min-guard A cues for hand placement for safety. Pt needs cues for reciprocal scooting back/forward in WC.  Nu Step x11 min, level 2 with bil UE and LE, aiming for 40 spm, which pt was able to achieve after warm-up and encouragement.  Static standing x51min at table folding linens for balance and activity tolerance.    See FIM for current functional status  Therapy/Group: Individual Therapy  Virl Cagey, PT 10/04/2011, 8:45 AM

## 2011-10-04 NOTE — Progress Notes (Addendum)
Physical Therapy Session Note  Patient Details  Name: Misty Shah MRN: [id number] Date of Birth: [birth year]  Today's Date: 10/04/2011 Time: 1303-1400 Time Calculation (min): 57 min  Short Term Goals: See goals on notes from previous sessions today  Skilled Therapeutic Interventions/Progress Updates:   Third session today focused on bed mobility with bed flat no rails, short distance walking with RW and bed exercises to increase mobility in the bed and general leg strength.  Min assist sit to supine to help left leg over EOB (may want to try leg lifter with her?), modified independent to get out of bed using nearby recliner chair to pull her trunk up and extra time to move her own leg over the side of the bed.  Gait 20' with RW min assist to steady patient for balance due to increased antalgic gait pattern after sitting in the [facility] for a while.  Sit to stand and stand to sit with supervision from WC--->RW-->bed.  Exercises supine in the bed: ankle pumps x 20 each bil, quad sets bil x 12 each, heel slides x 12 left active-assist, glute sets x 12, bridges active-assist to stabilize feet and position left leg x 10, hip abduction bil x 12 each active R, active-assist left, SAQs x 12 each bil active.    Therapy Documentation Precautions:  Precautions Precautions: Fall Restrictions Weight Bearing Restrictions: No LLE Weight Bearing: Weight bearing as tolerated Pain: Pain Assessment Pain Score:   5 Pain Type: Surgical pain Pain Location: Leg Pain Orientation: Left Pain Intervention(s): Repositioned;Ambulation/increased activity Mobility: Bed Mobility Supine to Sit: 6: Modified independent (Device/Increase time) Supine to Sit Details (indicate cue type and reason): flat bed, no rail, but patient reaching for ralining on recliner next to bed. Able to get up on her own using the recliner rail and extra time to move her own leg over edge of bed.   Sit to Supine: 4: Min assist Sit to Supine -  Details (indicate cue type and reason): min assist to help get left leg up over EOB. Attempted hook technique using R leg to hook L leg, but still unable with HOB flat and no rails.   Locomotion : Ambulation Ambulation/Gait Assistance: 4: Min assist   See FIM for current functional status  Therapy/Group: Individual Therapy  Lurena Joiner B. Bereket Gernert, PT, DPT [phone]   10/04/2011, 3:24 PM

## 2011-10-04 NOTE — Progress Notes (Signed)
Patient ID: Misty Shah, female   DOB: 13-Aug-1915, 78 y.o.   MRN: [id number]  Subjective/Complaints: Got through therapies yesterday. Feels she's getting stronger. Still desats at times. All other ROS negative.  Objective: Vital Signs: Blood pressure 127/50, pulse 80, temperature 99 F (37.2 C), temperature source Oral, resp. rate 18, height 5\' 2"  (1.575 m), weight 53.9 kg (118 lb 13.3 oz), SpO2 98.00%. Dg Chest 2 View  10/03/2011  *RADIOLOGY REPORT*  Clinical Data: Shortness of breath, cough  CHEST - 2 VIEW  Comparison: 09/29/2011  Findings: Cardiomediastinal silhouette is stable.  Stable bilateral small pleural effusion and bilateral basilar atelectasis. Osteopenia and degenerative changes thoracic spine again noted. There is  old fracture deformity bilateral proximal humerus.  There is a subtle hazy airspace disease in the right upper lobe suspicious for infiltrate/pneumonia.  Follow-up to resolution after treatment is recommended. Again noted hyperinflation and chronic interstitial prominence.  IMPRESSION: Hyperinflation again noted.  Stable bilateral pleural effusion with bilateral basilar atelectasis.  Subtle airspace disease in the right upper lobe suspicious for evolving infiltrate/pneumonia. Follow-up to resolution after appropriate treatment is recommended.  Original Report Authenticated By: Natasha Mead, M.D.    Basename 10/04/11 0635 10/03/11 0609  WBC 10.7* 9.4  HGB 8.8* 8.6*  HCT 26.1* 25.7*  PLT 298 265    Basename 10/04/11 0635 10/03/11 0609  NA 130* 128*  K 3.3* 3.7  CL 92* 92*  CO2 30 27  GLUCOSE 102* 88  BUN 17 15  CREATININE 0.63 0.50  CALCIUM 8.3* 8.1*   CBG (last 3)  No results found for this basename: GLUCAP:3 in the last 72 hours  Wt Readings from Last 3 Encounters:  09/28/11 53.9 kg (118 lb 13.3 oz)  09/25/11 54.432 kg (120 lb)  09/25/11 54.432 kg (120 lb)    Physical Exam:  General appearance: alert, cooperative, appears stated age and no distress Head:  Normocephalic, without obvious abnormality, atraumatic Eyes: conjunctivae/corneas clear. PERRL, EOM's intact. Fundi benign. Ears: normal TM's and external ear canals both ears Nose: Nares normal. Septum midline. Mucosa normal. No drainage or sinus tenderness. Throat: lips, mucosa, and tongue normal; teeth and gums normal Neck: no adenopathy, no carotid bruit, no JVD, supple, symmetrical, trachea midline and thyroid not enlarged, symmetric, no tenderness/mass/nodules Back: symmetric, no curvature. ROM normal. No CVA tenderness. Resp: clear to auscultation bilaterally poor inspiratory effort overall. Cardio: regular rate and rhythm, S1, S2 normal, no murmur, click, rub or gallop GI: soft, non-tender; bowel sounds normal; no masses,  no organomegaly Extremities: extremities normal, atraumatic, no cyanosis or edema Pulses: 2+ and symmetric Skin: Skin color, texture, turgor normal. No rashes or lesions Neurologic: Grossly normal. Pain inhibition weakness. Cognitively intact. CN normal Incision/Wound: wounds clean and intact with staples. allevyn dressing with minimal drainage. Area appropriately tender   Assessment/Plan: 1. Functional deficits secondary to left intertrochanteric hip fx which require 3+ hours per day of interdisciplinary therapy in a comprehensive inpatient rehab setting. Physiatrist is providing close team supervision and 24 hour management of active medical problems listed below. Physiatrist and rehab team continue to assess barriers to discharge/monitor patient progress toward functional and medical goals. FIM: FIM - Bathing Bathing Steps Patient Completed: Chest;Right Arm;Left Arm;Abdomen;Front perineal area;Buttocks;Right upper leg;Left upper leg Bathing: 4: Min-Patient completes 8-9 40f 10 parts or 75+ percent  FIM - Upper Body Dressing/Undressing Upper body dressing/undressing steps patient completed: Thread/unthread right sleeve of pullover shirt/dresss;Thread/unthread  left sleeve of pullover shirt/dress;Put head through opening of pull over shirt/dress;Pull shirt over  trunk;Thread/unthread right sleeve of front closure shirt/dress;Thread/unthread left sleeve of front closure shirt/dress;Pull shirt around back of front closure shirt/dress;Button/unbutton shirt Upper body dressing/undressing: 5: Set-up assist to: Obtain clothing/put away FIM - Lower Body Dressing/Undressing Lower body dressing/undressing steps patient completed: Thread/unthread right underwear leg;Pull underwear up/down;Pull pants up/down Lower body dressing/undressing: 2: Max-Patient completed 25-49% of tasks  FIM - Toileting Toileting steps completed by patient: Performs perineal hygiene;Adjust clothing after toileting Toileting Assistive Devices: Grab bar or rail for support Toileting: 3: Mod-Patient completed 2 of 3 steps  FIM - Diplomatic Services operational officer Devices: Elevated toilet seat;Walker;Grab bars Toilet Transfers: 4-To toilet/BSC: Min A (steadying Pt. > 75%);3-From toilet/BSC: Mod A (lift or lower assist)  FIM - Bed/Chair Transfer Bed/Chair Transfer Assistive Devices: Manufacturing systems engineer Transfer: 3: Supine > Sit: Mod A (lifting assist/Pt. 50-74%/lift 2 legs;4: Bed > Chair or W/C: Min A (steadying Pt. > 75%)  FIM - Locomotion: Wheelchair Locomotion: Wheelchair: 1: Total Assistance/staff pushes wheelchair (Pt<25%) FIM - Locomotion: Ambulation Locomotion: Ambulation Assistive Devices: Designer, industrial/product Ambulation/Gait Assistance: 4: Min assist Locomotion: Ambulation: 1: Travels less than 50 ft with minimal assistance (Pt.>75%)  Comprehension Comprehension Mode: Auditory Comprehension: 6-Follows complex conversation/direction: With extra time/assistive device  Expression Expression Mode: Verbal Expression: 6-Expresses complex ideas: With extra time/assistive device  Social Interaction Social Interaction: 6-Interacts appropriately with others with medication or  extra time (anti-anxiety, antidepressant).  Problem Solving Problem Solving: 6-Solves complex problems: With extra time  Memory Memory: 6-More than reasonable amt of time  1. left displaced intertrochanteric hip fracture. Status post closed reduction and internal fixation with intramedullary nail 09-25-11. Weightbearing as tolerated  2. DVT Prophylaxis/Anticoagulation: Subcutaneous Lovenox. Monitor platelet counts any signs of bleeding  3. Pain Management: Tramadol 50 mg every 6 hours as needed and Robaxin as needed. Monitor mental status   -add voltaren gel for OA in hands  -pain likely from increased activities in therapy- reassured pt 4. postoperative anemia. Patient has been transfused. hgb dropped a little- follow up later in the week. 5. Hypertension Norvasc 5 mg daily, Lotensin 20 mg daily, HCTZ 12.5 mg daily, Lopressor 100 mg twice daily. Good control so far. 6. Hypothyroidism. Synthroid  7. Hypokalemia. Need to further replace. Sodium better 8. Klebsiella urinary tract infection.sensitive to PO cipro started 5/2--change to levaquin 9. Hypomagnesia follow labs after transfusion 10. Low oxygen sats- encourage IS  -mild pulmonary congestion improved. Off lasix  -recheck cxr with ?RUL infiltrate- will change cipro to levaquin and recheck xray friday  -oxygen as needed  LOS (Days) 6 A FACE TO FACE EVALUATION WAS PERFORMED  Ramiah Helfrich T 10/04/2011, 7:57 AM

## 2011-10-05 LAB — BASIC METABOLIC PANEL
CO2: 30 mEq/L (ref 19–32)
Chloride: 92 mEq/L — ABNORMAL LOW (ref 96–112)
Glucose, Bld: 87 mg/dL (ref 70–99)
Potassium: 3.6 mEq/L (ref 3.5–5.1)
Sodium: 131 mEq/L — ABNORMAL LOW (ref 135–145)

## 2011-10-05 NOTE — Progress Notes (Signed)
Patient ID: Misty Shah, female   DOB: [birth year], 78 y.o.   MRN: [id number] Patient ID: Misty Shah, female   DOB: [birth year], 78 y.o.   MRN: [id number]  Subjective/Complaints: No new compaints.  All other ROS negative.  Objective: Vital Signs: Blood pressure 127/78, pulse 76, temperature 98.5 F (36.9 C), temperature source Oral, resp. rate 18, height 5\' 2"  (1.575 m), weight 53.9 kg (118 lb 13.3 oz), SpO2 97.00%. No results found.  Basename 10/04/11 0635 10/03/11 0609  WBC 10.7* 9.4  HGB 8.8* 8.6*  HCT 26.1* 25.7*  PLT 298 265    Basename 10/05/11 0610 10/04/11 0635  NA 131* 130*  K 3.6 3.3*  CL 92* 92*  CO2 30 30  GLUCOSE 87 102*  BUN 15 17  CREATININE 0.63 0.63  CALCIUM 8.5 8.3*   CBG (last 3)  No results found for this basename: GLUCAP:3 in the last 72 hours  Wt Readings from Last 3 Encounters:  09/28/11 53.9 kg (118 lb 13.3 oz)  09/25/11 54.432 kg (120 lb)  09/25/11 54.432 kg (120 lb)    Physical Exam:  General appearance: alert, cooperative, appears stated age and no distress Head: Normocephalic, without obvious abnormality, atraumatic Eyes: conjunctivae/corneas clear. PERRL, EOM's intact. Fundi benign. Ears: normal TM's and external ear canals both ears Nose: Nares normal. Septum midline. Mucosa normal. No drainage or sinus tenderness. Throat: lips, mucosa, and tongue normal; teeth and gums normal Neck: no adenopathy, no carotid bruit, no JVD, supple, symmetrical, trachea midline and thyroid not enlarged, symmetric, no tenderness/mass/nodules Back: symmetric, no curvature. ROM normal. No CVA tenderness. Resp: clear to auscultation bilaterally better air movement today Cardio: regular rate and rhythm, S1, S2 normal, no murmur, click, rub or gallop GI: soft, non-tender; bowel sounds normal; no masses,  no organomegaly Extremities: extremities normal, atraumatic, no cyanosis or edema Pulses: 2+ and symmetric Skin: Skin color, texture, turgor normal. No rashes  or lesions Neurologic: Grossly normal. Pain inhibition weakness. Cognitively intact. CN normal Incision/Wound: wounds clean and intact with staples. allevyn dressing with minimal drainage. Area appropriately tender   Assessment/Plan: 1. Functional deficits secondary to left intertrochanteric hip fx which require 3+ hours per day of interdisciplinary therapy in a comprehensive inpatient rehab setting. Physiatrist is providing close team supervision and 24 hour management of active medical problems listed below. Physiatrist and rehab team continue to assess barriers to discharge/monitor patient progress toward functional and medical goals. FIM: FIM - Bathing Bathing Steps Patient Completed: Chest;Left Arm;Abdomen;Front perineal area;Buttocks;Right Arm;Right upper leg;Left upper leg Bathing: 4: Min-Patient completes 8-9 73f 10 parts or 75+ percent  FIM - Upper Body Dressing/Undressing Upper body dressing/undressing steps patient completed: Thread/unthread right sleeve of pullover shirt/dresss;Thread/unthread left sleeve of pullover shirt/dress;Put head through opening of pull over shirt/dress;Pull shirt over trunk;Thread/unthread right sleeve of front closure shirt/dress;Thread/unthread left sleeve of front closure shirt/dress;Button/unbutton shirt Upper body dressing/undressing: 4: Min-Patient completed 75 plus % of tasks FIM - Lower Body Dressing/Undressing Lower body dressing/undressing steps patient completed: Thread/unthread right underwear leg;Pull underwear up/down;Thread/unthread right pants leg;Pull pants up/down Lower body dressing/undressing: 2: Max-Patient completed 25-49% of tasks  FIM - Toileting Toileting steps completed by patient: Adjust clothing prior to toileting;Performs perineal hygiene;Adjust clothing after toileting Toileting Assistive Devices: Grab bar or rail for support Toileting: 6: More than reasonable amount of time  FIM - Scientist, research (physical sciences) Devices: Bedside commode Toilet Transfers: 4-To toilet/BSC: Min A (steadying Pt. > 75%)  FIM - Banker Devices:  Walker (leg lifter) Bed/Chair Transfer: 5: Supine > Sit: Supervision (verbal cues/safety issues);5: Bed > Chair or W/C: Supervision (verbal cues/safety issues)  FIM - Locomotion: Wheelchair Locomotion: Wheelchair: 1: Total Assistance/staff pushes wheelchair (Pt<25%) FIM - Locomotion: Ambulation Locomotion: Ambulation Assistive Devices: Designer, industrial/product Ambulation/Gait Assistance: 4: Min guard Locomotion: Ambulation: 1: Travels less than 50 ft with minimal assistance (Pt.>75%)  Comprehension Comprehension Mode: Auditory Comprehension: 6-Follows complex conversation/direction: With extra time/assistive device  Expression Expression Mode: Verbal Expression: 6-Expresses complex ideas: With extra time/assistive device  Social Interaction Social Interaction: 6-Interacts appropriately with others with medication or extra time (anti-anxiety, antidepressant).  Problem Solving Problem Solving: 6-Solves complex problems: With extra time  Memory Memory: 6-More than reasonable amt of time  1. left displaced intertrochanteric hip fracture. Status post closed reduction and internal fixation with intramedullary nail 09-25-11. Weightbearing as tolerated  2. DVT Prophylaxis/Anticoagulation: Subcutaneous Lovenox. Monitor platelet counts any signs of bleeding  3. Pain Management: Tramadol 50 mg every 6 hours as needed and Robaxin as needed. Monitor mental status   -added voltaren gel for OA in hands  -pain likely from increased activities in therapy- she continues to work thru pain and fatigue 4. postoperative anemia. Patient has been transfused. hgb dropped a little- follow up later in the week. 5. Hypertension Norvasc 5 mg daily, Lotensin 20 mg daily, HCTZ 12.5 mg daily, Lopressor 100 mg twice daily. Good control so far. 6.  Hypothyroidism. Synthroid  7. Hypokalemia. Need to further replace. Sodium better 8. Klebsiella urinary tract infection.sensitive to levaquin 9. Hypomagnesia follow labs after transfusion 10. Low oxygen sats- encourage IS  -mild pulmonary congestion improved. Off lasix  -recheck cxr with ?RUL infiltrate- levaquin- check cxr tomorrow  -oxygen as needed  LOS (Days) 7 A FACE TO FACE EVALUATION WAS PERFORMED  Gweneth Fredlund T 10/05/2011, 9:04 AM

## 2011-10-05 NOTE — Progress Notes (Signed)
Occupational Therapy Session Notes  Patient Details  Name: LYNE KHURANA MRN: [id number] Date of Birth: [birth year]  Today's Date: 10/05/2011  Short Term Goals: Week 1:  OT Short Term Goal 1 (Week 1): Bathe with Min assist in sit and stand using AE OT Short Term Goal 2 (Week 1): LB Dressing with Mod assist in sit and stand using AE OT Short Term Goal 3 (Week 1): Toileting and toilet Transfers Min assist OT Short Term Goal 4 (Week 1): Simple HM tasks with Min assist with RW  Skilled Therapeutic Interventions/Progress Updates:   Session #1 [id number] - 55 Minutes Individual Therapy No complaints of pain Upon entering room patient seated in w/c beside bed. Engaged in functional mobility using rolling walker from w/c <-> walk-in shower for ADL retraining at shower level. Focused skilled intervention on UB/LB bathing in shower in sit->stand position, dynamic standing balance/tolerance/endurance, and overall activity tolerance/endurance. Used AE to help increase independence with LB ADLs. Patient with more than reasonable amount of time to complete tasks. Left patient seated in w/c with call bell & phone within reach.   Session #2 [id number] - 45 Minutes Individual Therapy Patient with complaints of pain at beginning of session, no rate given; RN aware. Treatment emphasis on home management tasks. Therapist propelled patient -> ADL apartment for functional mobility using rolling walker throughout apartment & in hallway and tub/shower transfer onto tub transfer bench. Focused skilled intervention on overall activity tolerance/endurance, sit/stands, pain management, and transfers.   Precautions:  Precautions Precautions: Fall Restrictions Weight Bearing Restrictions: No LLE Weight Bearing: Weight bearing as tolerated  See FIM for current functional status  Weslee Prestage 10/05/2011, 10:29 AM

## 2011-10-05 NOTE — Progress Notes (Signed)
Physical Therapy Session Note  Patient Details  Name: Misty Shah MRN: [id number] Date of Birth: 1915/07/08  Today's Date: 10/05/2011 Time: 13:45-14:30Time Calculation (min): 45 min  Short Term Goals: Week 2:     Skilled Therapeutic Interventions/Progress Updates:  Pt reports being "just so tired" today and presented fatigued during therapy session.  Pt ascended and descended 3 steps with min assist for balance and support.  Pt required verbal cues for proper technique and demonstrated/verbalized understanding.  Pt required rest break prior to ascending/descending 3 steps again min assist.  Pt's son "Buddy" arrived mid session to observe.  Pt completed 10 minutes on NuStep (workload=1, 212 steps) for improved endurance, strengthening, and activity tolerance.  Pt returned to room and left with son present.  Therapy Documentation Precautions:  Precautions Precautions: Fall Restrictions Weight Bearing Restrictions: No LLE Weight Bearing: Weight bearing as tolerated  Pain: No complaints of pain  See FIM for current functional status  Therapy/Group: Individual Therapy  Deirdre Pippins 10/05/2011, 3:52 PM

## 2011-10-05 NOTE — Progress Notes (Signed)
Physical Therapy Session Note  Patient Details  Name: Misty Shah MRN: [id number] Date of Birth: 1916/03/14  Today's Date: 10/05/2011 Time: 0800-0850 Time Calculation (min): 50 min  Short Term Goals: Week 1:  PT Short Term Goal 1 (Week 1): Pt will perform rolling Rt/Lt with supervision.  PT Short Term Goal 1 - Progress (Week 1): Progressing toward goal PT Short Term Goal 2 (Week 1): Pt will perform supine to sit with min assist and adaptive equipment as needed, min verbal cues for sequence.  PT Short Term Goal 2 - Progress (Week 1): Progressing toward goal PT Short Term Goal 3 (Week 1): Pt will perform sit <> stand with min-guard assist, min verbal cues for safety.  PT Short Term Goal 3 - Progress (Week 1): Progressing toward goal PT Short Term Goal 4 (Week 1): Pt will be able to perform static balance activities without UE support x 2 min with supervision.  PT Short Term Goal 4 - Progress (Week 1): Progressing toward goal PT Short Term Goal 5 (Week 1): Pt will ambulate 31' with RW with min assist.  PT Short Term Goal 5 - Progress (Week 1): Progressing toward goal  Skilled Therapeutic Interventions/Progress Updates: Bed mobility retraining from flat surface and without rails, difficulty moving LLE; introduced leg lifter for bed mobility and pt able to complete independently with leg lifter for supine to sit. Will leave in room to continue to practice with. Supervision sit to stand and gait x 15' to w/c, cueing for posture. Dynamic gait through obstacle course negotiating turns and stepping over objects/thresholds with steady A intermittently. Monitored O2 sats throughout treatment with activity; see details below.     Therapy Documentation Precautions:  Precautions Precautions: Fall Restrictions Weight Bearing Restrictions: No LLE Weight Bearing: Weight bearing as tolerated   Vital Signs: O2 via [place] 2 L = 97% Room air = 94% (rest) Room air = 80-85 % with activity Reapplied 2 L via  [place] with cues for deep breathing increased to 90-92%  Pain:  A little sore in L leg. Pain medicine being given.  See FIM for current functional status  Therapy/Group: Individual Therapy  [name] [facility] 10/05/2011, 9:00 AM

## 2011-10-05 NOTE — Progress Notes (Signed)
Reviewed and in agreement with treatment provided.  

## 2011-10-06 ENCOUNTER — Inpatient Hospital Stay (HOSPITAL_COMMUNITY): Payer: Medicare Other

## 2011-10-06 ENCOUNTER — Encounter (HOSPITAL_COMMUNITY): Payer: Self-pay | Admitting: Orthopaedic Surgery

## 2011-10-06 DIAGNOSIS — Z5189 Encounter for other specified aftercare: Secondary | ICD-10-CM

## 2011-10-06 DIAGNOSIS — W19XXXA Unspecified fall, initial encounter: Secondary | ICD-10-CM

## 2011-10-06 DIAGNOSIS — S72009A Fracture of unspecified part of neck of unspecified femur, initial encounter for closed fracture: Secondary | ICD-10-CM

## 2011-10-06 MED ORDER — LEVOFLOXACIN 250 MG PO TABS
250.0000 mg | ORAL_TABLET | Freq: Every day | ORAL | Status: DC
  Administered 2011-10-06 – 2011-10-11 (×6): 250 mg via ORAL
  Filled 2011-10-06 (×7): qty 1

## 2011-10-06 NOTE — Progress Notes (Signed)
Per State Regulation 482.30 This chart was reviewed for medical necessity with respect to the patient's Admission/Duration of stay. Monitoring labs. Chest xray today. Adjusting meds.  Meryl Dare                 Nurse Care Manager            Next Review Date: 10/09/11

## 2011-10-06 NOTE — Progress Notes (Signed)
Subjective/Complaints: Pain in left leg after getting up to the BR. Feeling better now, but she's worried it will hurt with therapies today All other ROS negative.  Objective: Vital Signs: Blood pressure 143/58, pulse 72, temperature 98.3 F (36.8 C), temperature source Oral, resp. rate 19, height 5\' 2"  (1.575 m), weight 53.9 kg (118 lb 13.3 oz), SpO2 96.00%. No results found.  Basename 10/04/11 0635  WBC 10.7*  HGB 8.8*  HCT 26.1*  PLT 298    Basename 10/05/11 0610 10/04/11 0635  NA 131* 130*  K 3.6 3.3*  CL 92* 92*  CO2 30 30  GLUCOSE 87 102*  BUN 15 17  CREATININE 0.63 0.63  CALCIUM 8.5 8.3*   CBG (last 3)  No results found for this basename: GLUCAP:3 in the last 72 hours  Wt Readings from Last 3 Encounters:  09/28/11 53.9 kg (118 lb 13.3 oz)  09/25/11 54.432 kg (120 lb)  09/25/11 54.432 kg (120 lb)    Physical Exam:  General appearance: alert, cooperative, appears stated age and no distress Head: Normocephalic, without obvious abnormality, atraumatic Eyes: conjunctivae/corneas clear. PERRL, EOM's intact. Fundi benign. Ears: normal TM's and external ear canals both ears Nose: Nares normal. Septum midline. Mucosa normal. No drainage or sinus tenderness. Throat: lips, mucosa, and tongue normal; teeth and gums normal Neck: no adenopathy, no carotid bruit, no JVD, supple, symmetrical, trachea midline and thyroid not enlarged, symmetric, no tenderness/mass/nodules Back: symmetric, no curvature. ROM normal. No CVA tenderness. Resp: clear to auscultation bilaterally better air movement today Cardio: regular rate and rhythm, S1, S2 normal, no murmur, click, rub or gallop GI: soft, non-tender; bowel sounds normal; no masses,  no organomegaly Extremities: extremities normal, atraumatic, no cyanosis or edema Pulses: 2+ and symmetric Skin: Skin color, texture, turgor normal. No rashes or lesions Neurologic: Grossly normal. Pain inhibition weakness. Cognitively intact. CN  normal Incision/Wound: wounds clean and intact with staples. allevyn dressing with minimal drainage. Area appropriately tender- no changes in appearance   Assessment/Plan: 1. Functional deficits secondary to left intertrochanteric hip fx which require 3+ hours per day of interdisciplinary therapy in a comprehensive inpatient rehab setting. Physiatrist is providing close team supervision and 24 hour management of active medical problems listed below. Physiatrist and rehab team continue to assess barriers to discharge/monitor patient progress toward functional and medical goals. FIM: FIM - Bathing Bathing Steps Patient Completed: Chest;Right Arm;Left Arm;Abdomen;Front perineal area;Buttocks;Right upper leg;Left upper leg;Right lower leg (including foot);Left lower leg (including foot) Bathing: 4: Steadying assist  FIM - Upper Body Dressing/Undressing Upper body dressing/undressing steps patient completed: Thread/unthread right sleeve of pullover shirt/dresss;Thread/unthread left sleeve of pullover shirt/dress;Pull shirt over trunk;Put head through opening of pull over shirt/dress;Thread/unthread right sleeve of front closure shirt/dress;Thread/unthread left sleeve of front closure shirt/dress;Button/unbutton shirt;Pull shirt around back of front closure shirt/dress Upper body dressing/undressing: 5: Supervision: Safety issues/verbal cues FIM - Lower Body Dressing/Undressing Lower body dressing/undressing steps patient completed: Thread/unthread right underwear leg;Thread/unthread left underwear leg;Pull underwear up/down Lower body dressing/undressing: 2: Max-Patient completed 25-49% of tasks  FIM - Toileting Toileting steps completed by patient: Adjust clothing prior to toileting;Performs perineal hygiene;Adjust clothing after toileting Toileting Assistive Devices: Grab bar or rail for support Toileting: 6: More than reasonable amount of time  FIM - Scientist, research (physical sciences) Devices: Bedside commode Toilet Transfers: 4-To toilet/BSC: Min A (steadying Pt. > 75%)  FIM - Bed/Chair Transfer Bed/Chair Transfer Assistive Devices: Walker (leg lifter) Bed/Chair Transfer: 5: Supine > Sit: Supervision (verbal cues/safety issues);5: Bed >  Chair or W/C: Supervision (verbal cues/safety issues)  FIM - Locomotion: Wheelchair Locomotion: Wheelchair: 1: Total Assistance/staff pushes wheelchair (Pt<25%) FIM - Locomotion: Ambulation Locomotion: Ambulation Assistive Devices: Designer, industrial/product Ambulation/Gait Assistance: 4: Min guard Locomotion: Ambulation: 1: Travels less than 50 ft with minimal assistance (Pt.>75%)  Comprehension Comprehension Mode: Auditory Comprehension: 6-Follows complex conversation/direction: With extra time/assistive device  Expression Expression Mode: Verbal Expression: 6-Expresses complex ideas: With extra time/assistive device  Social Interaction Social Interaction: 6-Interacts appropriately with others with medication or extra time (anti-anxiety, antidepressant).  Problem Solving Problem Solving: 6-Solves complex problems: With extra time  Memory Memory: 6-More than reasonable amt of time  1. left displaced intertrochanteric hip fracture. Status post closed reduction and internal fixation with intramedullary nail 09-25-11. Weightbearing as tolerated  2. DVT Prophylaxis/Anticoagulation: Subcutaneous Lovenox. Monitor platelet counts any signs of bleeding  3. Pain Management: Tramadol 50 mg every 6 hours as needed and Robaxin as needed. Monitor mental status   -added voltaren gel for OA in hands-with good effect  -pain likely from increased activities in therapy- she continues to work thru pain and fatigue  -i told her we would re-xray hip if it proves to be more painful today 4. postoperative anemia. Patient has been transfused. hgb 8.8 5. Hypertension Norvasc 5 mg daily, Lotensin 20 mg daily, HCTZ 12.5 mg daily, Lopressor 100 mg twice  daily. Good control so far. 6. Hypothyroidism. Synthroid  7. Hypokalemia. Need to further replace. Sodium better 8. Klebsiella urinary tract infection.sensitive to levaquin 9. Hypomagnesia follow labs after transfusion 10. Low oxygen sats- encourage IS  -mild pulmonary congestion improved. Off lasix  -recheck cxr with ?RUL infiltrate- levaquin- cxr pending.  -oxygen as needed  LOS (Days) 8 A FACE TO FACE EVALUATION WAS PERFORMED  Kayvon Mo T 10/06/2011, 7:38 AM

## 2011-10-06 NOTE — Progress Notes (Signed)
Social Work Patient ID: Misty Shah, female   DOB: 14-Feb-1916, 78 y.o.   MRN: [id number] Met with son to discuss discharge needs. He reports he bought a transport chair for her to use in the community. He is aware she will require 24 hour supervision and is working on this.  He has been here to observe in therapies and Feels comfortable with her care, he hopes they wean her off the o2 she is using here.   Will ask PA regarding this.  Pt reports: " I am Doing my best."

## 2011-10-06 NOTE — Progress Notes (Signed)
Occupational Therapy Weekly Progress Note & Session Note  Patient Details  Name: Misty Shah MRN: [id number] Date of Birth: Jun 05, 1915  Today's Date: 10/06/2011  WEEKLY PROGRESS NOTE  Patient has met 4 of 4 short term goals.  Patient is making good progress during CIR stay. Patient able to complete UB ADLs with set-up/supervision and LB ADLs with moderate assistance. Adaptive equipment (reacher, sock aid, long handled shoe horn, and long handled sponge) has been introduced, patient has been educated on each AE usage, and patient has return demonstrate safe and effective use of equipment. Patient continues to need some assistance with using reacher as an aid to assist with LB dressing at this time. Plan remains the same for patient to discharge -> home where daughter plans to take ~1 week of FMLA for supervision and assistance prn. Recommend patient have 24/7 supervision/assistance longer than 1 week, patient LTGs are for close supervision -> minimal assistance; patient also has a history of falls.   Patient continues to demonstrate the following deficits: decreased activity tolerance/endurance, increased pain, decreased independence with ADLs/IADLs, decreased independence with functional mobility and transfers, decreased dynamic standing balance/tolerance/endurance. Therefore, patient will continue to benefit from skilled OT intervention to enhance overall performance with BADL, iADL and Reduce care partner burden.  Patient progressing toward long term goals..  Continue plan of care.  OT Short Term Goals Week 1:  OT Short Term Goal 1 (Week 1): Bathe with Min assist in sit and stand using AE OT Short Term Goal 1 - Progress (Week 1): Met OT Short Term Goal 2 (Week 1): LB Dressing with Mod assist in sit and stand using AE OT Short Term Goal 2 - Progress (Week 1): Met OT Short Term Goal 3 (Week 1): Toileting and toilet Transfers Min assist OT Short Term Goal 3 - Progress (Week 1): Met OT Short Term  Goal 4 (Week 1): Simple HM tasks with Min assist with RW OT Short Term Goal 4 - Progress (Week 1): Met  New (Week 2) Short Term Goals = Long Term Goals  Skilled Therapeutic Interventions/Progress Updates:  Balance/vestibular training;Community reintegration;Discharge planning;DME/adaptive equipment instruction;Functional mobility training;Pain management;Neuromuscular re-education;Patient/family education;Psychosocial support;Self Care/advanced ADL retraining;Skin care/wound managment;Therapeutic Activities;Therapeutic Exercise;UE/LE Strength taining/ROM;UE/LE Coordination activities;Wheelchair propulsion/positioning   Precautions:  Precautions Precautions: Fall Restrictions Weight Bearing Restrictions: No LLE Weight Bearing: Weight bearing as tolerated  ADL - See FIM  -------------------------------------------------------------------------------------------------------------------------------------------------------  SESSION NOTE 1015-1100 - 45 Minutes Individual Therapy Patient with pain in left hip during movement, patient stated she already had something for pain; assisted patient through re-positioning.  Upon entering room patient found seated in recliner with bilateral legs elevated. Patient declined any bathing via sink OR shower at this time, "I just want to get dressed". Engaged in UB/LB dressing using AE prn in sit/stand position. Patient with more than reasonable amount of time to complete tasks. Skilled intervention focusing on overall activity tolerance/endurance, sit/stands, dressing with use of AE, and pain management. Left patient seated in recliner with TED hose donned and bilateral legs elevated to help decrease swelling/edema. Call bell & phone within reach and patient's daughter & son present in room.   Misty Shah 10/06/2011, 11:54 AM

## 2011-10-06 NOTE — Progress Notes (Signed)
occupational Therapy Note  Patient Details  Name: ALANI SABBAGH MRN: [id number] Date of Birth: Mar 27, 1916 Today's Date: 10/06/2011  Time:  1300-1400   (60 min) Pain:  None Individual Therapy  Pt eating lunch upon OT arrival.  Daughter, Steward Drone present from [place], [place].  Educated Sports coach on Merchant [employer] officer.  Pt familiar with reacher and knew how to use with moderate assist.  Used sock aid, shoe horn and leg lifter.  Daughter observed but pt needs further education to reinforce.  Addressed functional ambulation in ADL apartment with transfer to tub with bench.  Daughter instructed to get a gait belt for safety.  Daughter slightly anxious while in treatment session and stated she can only stay with Mom for 1 week and then her brother will have to take over.   Daughter reported she had osteoporosis.  Pt exhibited decreased short term and long term  memory during session   Humberto Seals 10/06/2011, 5:23 PM

## 2011-10-06 NOTE — Plan of Care (Signed)
Problem: RH SAFETY Goal: RH STG ADHERE TO SAFETY PRECAUTIONS W/ASSISTANCE/DEVICE STG Adhere to Safety Precautions With Assistance/Device. supervision  Outcome: Progressing Calls appropriately     

## 2011-10-06 NOTE — Progress Notes (Signed)
Alert and orientated x 3. Forgetfullness at times, but quickly recals with extra time. Stand/pivot transferred from w/c to toilet with assist of grab bar and extra time. Able to don/doff clothing and perform perineal care after toilet transfer. Visible bruising to BUE and lower back. Hip incision x 3 with allevyn dressing c.d.i.  LLE with 3+pitting edema. Pt instructed to elevated leg when not in use. Remains continent of bowel and bladder. Last bowel movement 10/05/11. 02 at 2L. Pain controlled with Tramadol 50mg  q 6hrs. No unsafe behavior, calls appropriately. Daughter in room today for portion of shift.

## 2011-10-06 NOTE — Progress Notes (Addendum)
Physical Therapy Weekly Progress Note  Patient Details  Name: Misty Shah MRN: [id number] Date of Birth: [birth year]  Today's Date: 10/06/2011  Session #1 Time: 4403-4742 Time Calculation (min): 45 min Individual therapy C/o pain in LLE from earlier this AM, premedicated which pt reports is helping. Increased edema noted in LLE and foot, RN aware. Simulated car transfer with RW with overall min A for transfer, cueing for technique for sit to stand. Used leg lifter to help LLE into the car (min A needed), supervision for bringing legs out of car with min A for sit to stand. Gait training with RW with cueing for posture and increased WB on LLE to clear R foot, x 50' with min A. Close supervision transfer with Rw to recliner in pt room with legs elevated for edema control.   Session #2: Time: 5956-3875 Time Calculation (min): 45 minutes Reports pain is ok right but complaint of fatigue from all her therapies today. Gait with Rw with min A, cueing for posture and increased WB on LLE; x 25'. Nustep for functional strengthening and endurance x 10 minutes on level 1: 97% O2 on 2L at rest, took O2 off with activity decreased to 86% and with deep breathing increased only to 87%, reapplied O2 via [place]. Steady A transfer back to recliner at end of session to prop up LEs for edema control and encouraged pt to continue to do ankle pumps. Daughter and son present to observe therapies.   Patient has met 5 of 5 short term goals.  Pt is making good progress towards goal. Gait distance is still limited due to decreased endurance and complaints of pain in LLE. Pt still with difficulty weight bearing through LLE.  Patient continues to demonstrate the following deficits: balance, activity tolerance, ROM, strength and therefore will continue to benefit from skilled PT intervention to enhance overall performance with activity tolerance and balance.  Patient progressing toward long term goals..  Continue plan of  care.  PT Short Term Goals Week 1:  PT Short Term Goal 1 (Week 1): Pt will perform rolling Rt/Lt with supervision.  PT Short Term Goal 1 - Progress (Week 1): Met PT Short Term Goal 2 (Week 1): Pt will perform supine to sit with min assist and adaptive equipment as needed, min verbal cues for sequence.  PT Short Term Goal 2 - Progress (Week 1): Met PT Short Term Goal 3 (Week 1): Pt will perform sit <> stand with min-guard assist, min verbal cues for safety.  PT Short Term Goal 3 - Progress (Week 1): Met PT Short Term Goal 4 (Week 1): Pt will be able to perform static balance activities without UE support x 2 min with supervision.  PT Short Term Goal 4 - Progress (Week 1): Met PT Short Term Goal 5 (Week 1): Pt will ambulate 54' with RW with min assist.  PT Short Term Goal 5 - Progress (Week 1): Met Week 2:  PT Short Term Goal 1 (Week 2): = LTGs  Skilled Therapeutic Interventions/Progress Updates:  Ambulation/gait training;Balance/vestibular training;Discharge planning;Community reintegration;DME/adaptive equipment instruction;Functional mobility training;Patient/family education;Neuromuscular re-education;Pain management;Psychosocial support;Therapeutic Activities;Stair training;Splinting/orthotics;Skin care/wound management;Therapeutic Exercise;UE/LE Strength taining/ROM;UE/LE Coordination activities;Wheelchair propulsion/positioning   Therapy Documentation Precautions:  Precautions Precautions: Fall Restrictions Weight Bearing Restrictions: No LLE Weight Bearing: Weight bearing as tolerated   Vital Signs:  2L  O2 via [place] Pain: Premedicated. C/o LLE pain, unrated.  See FIM for current functional status  Therapy/Group: Individual Therapy  [name] [facility] 10/06/2011, 9:48 AM

## 2011-10-06 NOTE — Plan of Care (Signed)
Problem: RH PAIN MANAGEMENT Goal: RH STG PAIN MANAGED AT OR BELOW PT'S PAIN GOAL Pain <3  Outcome: Progressing Pain controlled with prn pain medication

## 2011-10-07 NOTE — Progress Notes (Signed)
Occupational Therapy Session Note  Patient Details  Name: Misty Shah MRN: [id number] Date of Birth: 1915/08/28  Today's Date: 10/07/2011 Time: 0900-0955 Time Calculation (min): 55 min  Short Term Goals: Week 1:  OT Short Term Goal 1 (Week 1): Bathe with Min assist in sit and stand using AE OT Short Term Goal 1 - Progress (Week 1): Met OT Short Term Goal 2 (Week 1): LB Dressing with Mod assist in sit and stand using AE OT Short Term Goal 2 - Progress (Week 1): Met OT Short Term Goal 3 (Week 1): Toileting and toilet Transfers Min assist OT Short Term Goal 3 - Progress (Week 1): Met OT Short Term Goal 4 (Week 1): Simple HM tasks with Min assist with RW OT Short Term Goal 4 - Progress (Week 1): Met  Skilled Therapeutic Interventions/Progress Updates:    Pt engaged in ADL retraining including bathing and dressing w/c level at sink.  Pt amb with r/w to gather clothes and over to w/c at sink.  Pt owns AE but requires min verbal cues for reacher use with donning pants.  Pt requires extra time to complete all tasks and requires verbal cues for redirection and attending to task.  Therapy Documentation Precautions:  Precautions Precautions: Fall Restrictions Weight Bearing Restrictions: No LLE Weight Bearing: Weight bearing as tolerated Pain: Pain Assessment Pain Assessment: No/denies pain  See FIM for current functional status  Therapy/Group: Individual Therapy  Rich Brave 10/07/2011, 9:58 AM

## 2011-10-07 NOTE — Progress Notes (Signed)
Patient ID: Misty Shah, female   DOB: 1915/10/22, 78 y.o.   MRN: [id number] Subjective/Complaints:  Has some discomfort of left leg. Otherwise she feels well. She denies any chest pain, shortness of breath. She is currently wearing oxygen and has not required oxygen at home. All other ROS negative.  Objective: Vital Signs: Blood pressure 133/61, pulse 69, temperature 97.6 F (36.4 C), temperature source Oral, resp. rate 17, height 5\' 2"  (1.575 m), weight 118 lb 13.3 oz (53.9 kg), SpO2 97.00%. Dg Chest 2 View  10/06/2011  *RADIOLOGY REPORT*  Clinical Data:  infiltrate.  No chest pain.  Short of breath.  CHEST - 2 VIEW  Comparison: 10/03/2011.  Findings: Compared to the prior exam, there is decreasing density of the right upper lobe opacity, likely representing improving pneumonia.  Bilateral pleural effusions appear more prominent which may be positional.  Severe emphysema is present.  Thoracic kyphosis.  Cardiomegaly.  Tortuous thoracic aorta appears similar. Deformity of the proximal right humerus compatible with healed fracture.  Radiographic follow-up is recommended to ensure clearing and exclude an underlying lesion.  Radiographic clearing is usually observed at 4-6 weeks.  IMPRESSION: Improving right apical density, likely representing treated pneumonia.  Continued follow-up to ensure clearing is recommended. Likely positional shift in the bilateral pleural effusions.  Original Report Authenticated By: Andreas Newport, M.D.   No results found for this basename: WBC:2,HGB:2,HCT:2,PLT:2 in the last 72 hours  Basename 10/05/11 0610  NA 131*  K 3.6  CL 92*  CO2 30  GLUCOSE 87  BUN 15  CREATININE 0.63  CALCIUM 8.5   CBG (last 3)  No results found for this basename: GLUCAP:3 in the last 72 hours  Wt Readings from Last 3 Encounters:  09/28/11 118 lb 13.3 oz (53.9 kg)  09/25/11 120 lb (54.432 kg)  09/25/11 120 lb (54.432 kg)    Physical Exam:  Elderly female in no acute distress. She  appears much younger than her stated age. She is quite pleasant. HEENT exam atraumatic, normocephalic. Neck is supple. Chest clear to auscultation cardiac exam S1-S2 are regular. Abdominal exam thin, and bowel sounds, soft. Extremities she has 1-2+ edema of the left lower extremity and ecchymoses over the left hip. Assessment/Plan: 1. Functional deficits secondary to left intertrochanteric hip fx which require 3+ hours per day of interdisciplinary therapy in a comprehensive inpatient rehab setting. Physiatrist is providing close team supervision and 24 hour management of active medical problems listed below. Physiatrist and rehab team continue to assess barriers to discharge/monitor patient progress toward functional and medical goals. FIM: FIM - Bathing Bathing Steps Patient Completed: Chest;Right Arm;Left Arm;Abdomen;Front perineal area;Buttocks;Right upper leg;Left upper leg;Right lower leg (including foot);Left lower leg (including foot) Bathing: 4: Steadying assist  FIM - Upper Body Dressing/Undressing Upper body dressing/undressing steps patient completed: Thread/unthread right sleeve of pullover shirt/dresss;Thread/unthread left sleeve of pullover shirt/dress;Put head through opening of pull over shirt/dress;Pull shirt over trunk;Thread/unthread right sleeve of front closure shirt/dress;Thread/unthread left sleeve of front closure shirt/dress;Pull shirt around back of front closure shirt/dress;Button/unbutton shirt Upper body dressing/undressing: 5: Set-up assist to: Obtain clothing/put away FIM - Lower Body Dressing/Undressing Lower body dressing/undressing steps patient completed: Thread/unthread right underwear leg;Thread/unthread left underwear leg;Pull underwear up/down;Thread/unthread right pants leg;Pull pants up/down Lower body dressing/undressing: 3: Mod-Patient completed 50-74% of tasks  FIM - Toileting Toileting steps completed by patient: Adjust clothing prior to  toileting;Performs perineal hygiene;Adjust clothing after toileting (Requires extra time) Toileting Assistive Devices: Grab bar or rail for support Toileting: 6: More  than reasonable amount of time  FIM - Diplomatic Services operational officer Devices: Grab bars Toilet Transfers: 6-More than reasonable amt of time;4-To toilet/BSC: Min A (steadying Pt. > 75%);4-From toilet/BSC: Min A (steadying Pt. > 75%)  FIM - Bed/Chair Transfer Bed/Chair Transfer Assistive Devices: Walker (leg lifter) Bed/Chair Transfer: 5: Supine > Sit: Supervision (verbal cues/safety issues);5: Bed > Chair or W/C: Supervision (verbal cues/safety issues)  FIM - Locomotion: Wheelchair Locomotion: Wheelchair: 1: Total Assistance/staff pushes wheelchair (Pt<25%) FIM - Locomotion: Ambulation Locomotion: Ambulation Assistive Devices: Designer, industrial/product Ambulation/Gait Assistance: 4: Min guard Locomotion: Ambulation: 2: Travels 50 - 149 ft with minimal assistance (Pt.>75%)  Comprehension Comprehension Mode: Auditory Comprehension: 5-Understands complex 90% of the time/Cues < 10% of the time  Expression Expression Mode: Verbal Expression: 5-Expresses complex 90% of the time/cues < 10% of the time  Social Interaction Social Interaction: 6-Interacts appropriately with others with medication or extra time (anti-anxiety, antidepressant).  Problem Solving Problem Solving: 6-Solves complex problems: With extra time  Memory Memory: 6-More than reasonable amt of time  1. left displaced intertrochanteric hip fracture. Status post closed reduction and internal fixation with intramedullary nail 09-25-11. Weightbearing as tolerated  2. DVT Prophylaxis/Anticoagulation: Subcutaneous Lovenox. Monitor platelet counts any signs of bleeding  CBC:    Component Value Date/Time   WBC 10.7* 10/04/2011 0635   HGB 8.8* 10/04/2011 0635   HCT 26.1* 10/04/2011 0635   PLT 298 10/04/2011 0635   MCV 88.2 10/04/2011 0635   NEUTROABS 9.5* 09/29/2011  0543   LYMPHSABS 1.7 09/29/2011 0543   MONOABS 1.7* 09/29/2011 0543   EOSABS 0.1 09/29/2011 0543   BASOSABS 0.0 09/29/2011 0543    3. Pain Management: Tramadol 50 mg every 6 hours as needed and Robaxin as needed. Monitor mental status   -added voltaren gel for OA in hands-with good effect  - 4. postoperative anemia. Patient has been transfused. hgb 8.8 5. Hypertension Norvasc 5 mg daily, Lotensin 20 mg daily, HCTZ 12.5 mg daily, Lopressor 100 mg twice daily. Good control so far. 6. Hypothyroidism. Synthroid  7. Hypokalemia. Resolved. Basic Metabolic Panel:    Component Value Date/Time   NA 131* 10/05/2011 0610   K 3.6 10/05/2011 0610   CL 92* 10/05/2011 0610   CO2 30 10/05/2011 0610   BUN 15 10/05/2011 0610   CREATININE 0.63 10/05/2011 0610   GLUCOSE 87 10/05/2011 0610   CALCIUM 8.5 10/05/2011 0610    8. Klebsiella urinary tract infection.sensitive to levaquin Levaquin started May 10, will discontinue may 17. 9. Hypomagnesia follow labs after transfusion Results for Misty Shah, Misty Shah (MRN [id number]) as of 10/07/2011 09:05  Ref. Range 09/26/2011 08:35 09/26/2011 12:30 09/27/2011 04:56 09/28/2011 04:55 09/28/2011 17:59 09/28/2011 20:00 09/29/2011 05:43 10/03/2011 06:09 10/04/2011 06:35 10/05/2011 06:10  Magnesium Latest Range: 1.5-2.5 mg/dL    1.4 (L)         10. Low oxygen sats- encourage IS  -mild pulmonary congestion improved. Off lasix  Review chest x-ray. There is clearing of right upper lobe infiltrate. LOS (Days) 9 A FACE TO FACE EVALUATION WAS PERFORMED  Owens Hara [name] 10/07/2011, 9:00 AM

## 2011-10-08 NOTE — Progress Notes (Signed)
Physical Therapy Note  Patient Details  Name: TEMPEST FRANKLAND MRN: [id number] Date of Birth: May 25, 1916 Today's Date: 10/08/2011  1430-1530 (60 minutes) group Pain: no complaint of pain Pt participated in PT group session focused on gait safety/endurance. Pt ambulates 50 feet X 3 RW close supervision with improved weight bearing LT LE;. Oxygen sats > 92%      Tiffine Henigan,JIM 10/08/2011, 3:27 PM

## 2011-10-08 NOTE — Progress Notes (Signed)
Patient ID: Misty Shah, female   DOB: 28-Oct-1915, 78 y.o.   MRN: [id number] Subjective/Complaints:  She is feeling well. Has no significant complaints. All other ROS negative.  Objective: Vital Signs: Blood pressure 163/84, pulse 70, temperature 97.7 F (36.5 C), temperature source Oral, resp. rate 18, height 5\' 2"  (1.575 m), weight 118 lb 13.3 oz (53.9 kg), SpO2 99.00%.  Physical Exam:  Elderly female in no acute distress. She appears much younger than her stated age. She is quite pleasant. HEENT exam atraumatic, normocephalic. Neck is supple. Chest clear to auscultation cardiac exam S1-S2 are regular. Abdominal exam thin, and bowel sounds, soft. Extremities she has 1-2+ edema of the left lower extremity and ecchymoses over the left hip. Assessment/Plan: 1. Functional deficits secondary to left intertrochanteric hip fx which require 3+ hours per day of interdisciplinary therapy in a comprehensive inpatient rehab setting. Physiatrist is providing close team supervision and 24 hour management of active medical problems listed below. Physiatrist and rehab team continue to assess barriers to discharge/monitor patient progress toward functional and medical goals. FIM: FIM - Bathing Bathing Steps Patient Completed: Chest;Right Arm;Left Arm;Abdomen;Front perineal area;Buttocks;Right upper leg;Left upper leg;Right lower leg (including foot);Left lower leg (including foot) Bathing: 4: Steadying assist  FIM - Upper Body Dressing/Undressing Upper body dressing/undressing steps patient completed: Thread/unthread right sleeve of pullover shirt/dresss;Thread/unthread left sleeve of pullover shirt/dress;Put head through opening of pull over shirt/dress;Pull shirt over trunk;Thread/unthread right sleeve of front closure shirt/dress;Thread/unthread left sleeve of front closure shirt/dress;Pull shirt around back of front closure shirt/dress;Button/unbutton shirt Upper body dressing/undressing: 5: Supervision:  Safety issues/verbal cues FIM - Lower Body Dressing/Undressing Lower body dressing/undressing steps patient completed: Thread/unthread right underwear leg;Thread/unthread left underwear leg;Pull underwear up/down;Thread/unthread left pants leg;Pull pants up/down Lower body dressing/undressing: 3: Mod-Patient completed 50-74% of tasks  FIM - Toileting Toileting steps completed by patient: Adjust clothing prior to toileting;Performs perineal hygiene;Adjust clothing after toileting Toileting Assistive Devices: Grab bar or rail for support Toileting: 6: More than reasonable amount of time  FIM - Diplomatic Services operational officer Devices: Grab bars Toilet Transfers: 6-More than reasonable amt of time;4-To toilet/BSC: Min A (steadying Pt. > 75%);4-From toilet/BSC: Min A (steadying Pt. > 75%)  FIM - Bed/Chair Transfer Bed/Chair Transfer Assistive Devices: Walker (leg lifter) Bed/Chair Transfer: 5: Supine > Sit: Supervision (verbal cues/safety issues);5: Bed > Chair or W/C: Supervision (verbal cues/safety issues)  FIM - Locomotion: Wheelchair Locomotion: Wheelchair: 1: Total Assistance/staff pushes wheelchair (Pt<25%) FIM - Locomotion: Ambulation Locomotion: Ambulation Assistive Devices: Designer, industrial/product Ambulation/Gait Assistance: 4: Min guard Locomotion: Ambulation: 2: Travels 50 - 149 ft with minimal assistance (Pt.>75%)  Comprehension Comprehension Mode: Auditory Comprehension: 5-Understands complex 90% of the time/Cues < 10% of the time  Expression Expression Mode: Verbal Expression: 5-Expresses complex 90% of the time/cues < 10% of the time  Social Interaction Social Interaction: 6-Interacts appropriately with others with medication or extra time (anti-anxiety, antidepressant).  Problem Solving Problem Solving: 6-Solves complex problems: With extra time  Memory Memory: 6-More than reasonable amt of time  1. left displaced intertrochanteric hip fracture. Status post  closed reduction and internal fixation with intramedullary nail 09-25-11. Weightbearing as tolerated  2. DVT Prophylaxis/Anticoagulation: Subcutaneous Lovenox. Monitor platelet counts any signs of bleeding  CBC:  3. Pain Management: Tramadol 50 mg every 6 hours as needed and Robaxin as needed. Monitor mental status   -added voltaren gel for OA in hands-with good effect  - 4. postoperative anemia. Patient has been transfused.  5. Hypertension Norvasc 5  mg daily, Lotensin 20 mg daily, HCTZ 12.5 mg daily, Lopressor 100 mg twice daily. Good control so far. BP Readings from Last 3 Encounters:  10/08/11 163/84  09/28/11 128/47  09/28/11 128/47   blood pressure somewhat elevated today. We will continue to monitor. Will not adjust medications at this time.  6. Hypothyroidism. Synthroid  7. Hypokalemia. Resolved. Basic Metabolic Panel: 8. Klebsiella urinary tract infection.sensitive to levaquin Levaquin started May 10, will discontinue may 17. 9. Hypomagnesia 10. Low oxygen sats- encourage IS  -mild pulmonary congestion improved. Off lasix  Review chest x-ray. There is clearing of right upper lobe infiltrate. LOS (Days) 10 A FACE TO FACE EVALUATION WAS PERFORMED  Misty Shah 10/08/2011, 8:32 AM

## 2011-10-09 LAB — CBC
HCT: 28.3 % — ABNORMAL LOW (ref 36.0–46.0)
Hemoglobin: 9.4 g/dL — ABNORMAL LOW (ref 12.0–15.0)
MCV: 88.4 fL (ref 78.0–100.0)
Platelets: 467 10*3/uL — ABNORMAL HIGH (ref 150–400)
RBC: 3.2 MIL/uL — ABNORMAL LOW (ref 3.87–5.11)
WBC: 9.2 10*3/uL (ref 4.0–10.5)

## 2011-10-09 NOTE — Progress Notes (Signed)
Reviewed and in agreement with treatment provided.  

## 2011-10-09 NOTE — Progress Notes (Signed)
Physical Therapy Session Note  Patient Details  Name: Misty Shah MRN: [id number] Date of Birth: 06-14-1915  Today's Date: 10/09/2011 Time:08:30-09:30 Time Calculation: 60 Minutes  Short Term Goals: Week 2:  PT Short Term Goal 1 (Week 2): = LTGs  Skilled Therapeutic Interventions/Progress Updates:  Pt reports that she will have her staples removed later today. Initial treatment focused on dynamic standing balance in RW with assistance provided for dressing. Pt received with 2L supp O2 in room but nasal canula removed at rest to assess tolerance without O2.  Pt SpO2 dropped to 79%.  Replaced nasal canula with 1L supplemental O2 and pt's SpO2 returned to 95% at rest. Remainder of treatment focused on WC--> bed transfers in ADL apartment.  Pt required frequent cues and assistance in problem solving using using leg lift for sitting EOB--> supine transfer.  After 3 repetitions, pt was able to use leg lift with supervision.  Pt performed rolling to the L with supervision and 5 bridges for strengthening and improved bed mobility.  Pt self-propelled WC 30' for increased endurance and activity tolerance.  On 1L supplemental O2, pt maintained SpO2 of 96% with activity.  Today's Date: 10/09/2011 Time:11:30-12:00 Time Calculation: 30 Minutes  Short Term Goals: Week 2:  PT Short Term Goal 1 (Week 2): = LTGs  Skilled Therapeutic Interventions/Progress Updates:  Pt reports feeling very fatigued and tired after a long morning of therapy.  Treatment focused on ambulation activities, maneuvering around and over obstacles for improvements in functional/safe ambulation and dynamic balance.  Pt continues to bear more weight through LLE but still presents with step to gait using RW.     Therapy Documentation Precautions:  Precautions Precautions: Fall Restrictions Weight Bearing Restrictions: No LLE Weight Bearing: Weight bearing as tolerated Pain: Complaints of "soreness" in L hip with transfers.    See  FIM for current functional status  Therapy/Group: Individual Therapy  Deirdre Pippins 10/09/2011, 12:08 PM

## 2011-10-09 NOTE — Progress Notes (Signed)
Per State Regulation 482.30 This chart was reviewed for medical necessity with respect to the patient's Admission/Duration of stay. Staples to be d/c's today. Checking urine culture and for PVRs due to c/o frequency and burning. Working on family ed in preparation for d/c home on 5/17. Meryl Dare                 Nurse Care Manager            Next Review Date: 10/12/11

## 2011-10-09 NOTE — Progress Notes (Signed)
Physical Therapy Session Note  Patient Details  Name: VALEREE LEIDY MRN: [id number] Date of Birth: [birth year]  Today's Date: 10/09/2011 Time: 7829-5621 Time Calculation (min): 45 min  Short Term Goals: Week 2:  PT Short Term Goal 1 (Week 2): = LTGs  Skilled Therapeutic Interventions/Progress Updates:  Pt received sitting in recliner.  Pt amb 15' with supervision using RW.  Pt reports that her son is still building her steps to get into her house.  Unsure if handrails are bilateral at this time.  Pt ascended 3 steps and descended 2 (x2) with min assist using bilateral handrails.  Pt then ambulated ~50' for endurance and activity tolerance.  Minimal cueing needed for proper technique and which LE to lead with.  Pt practiced car transfer using leg lift for LLE, requiring cueing and extra time.  Therapy Documentation Precautions:  Precautions Precautions: Fall Restrictions Weight Bearing Restrictions: No LLE Weight Bearing: Weight bearing as tolerated  Pain: No complaints of pain  See FIM for current functional status  Therapy/Group: Individual Therapy  Deirdre Pippins 10/09/2011, 3:40 PM

## 2011-10-09 NOTE — Plan of Care (Signed)
Problem: RH SAFETY Goal: RH STG ADHERE TO SAFETY PRECAUTIONS W/ASSISTANCE/DEVICE STG Adhere to Safety Precautions With Assistance/Device.supervision  Outcome: Progressing Calls appropriately for personal needs and toileting assist

## 2011-10-09 NOTE — Progress Notes (Signed)
Patient ID: Misty Shah, female   DOB: [birth year], 78 y.o.   MRN: [id number] Subjective/Complaints: Complains of urinary frequency and burning. Had frequency at home however All other ROS negative.  Objective: Vital Signs: Blood pressure 134/73, pulse 69, temperature 98.1 F (36.7 C), temperature source Oral, resp. rate 17, height 5\' 2"  (1.575 m), weight 53.9 kg (118 lb 13.3 oz), SpO2 99.00%. No results found. No results found for this basename: WBC:2,HGB:2,HCT:2,PLT:2 in the last 72 hours No results found for this basename: NA:2,K:2,CL:2,CO2:2,GLUCOSE:2,BUN:2,CREATININE:2,CALCIUM:2 in the last 72 hours CBG (last 3)  No results found for this basename: GLUCAP:3 in the last 72 hours  Wt Readings from Last 3 Encounters:  09/28/11 53.9 kg (118 lb 13.3 oz)  09/25/11 54.432 kg (120 lb)  09/25/11 54.432 kg (120 lb)    Physical Exam:  General appearance: alert, cooperative, appears stated age and no distress Head: Normocephalic, without obvious abnormality, atraumatic Eyes: conjunctivae/corneas clear. PERRL, EOM's intact. Fundi benign. Ears: normal TM's and external ear canals both ears Nose: Nares normal. Septum midline. Mucosa normal. No drainage or sinus tenderness. Throat: lips, mucosa, and tongue normal; teeth and gums normal Neck: no adenopathy, no carotid bruit, no JVD, supple, symmetrical, trachea midline and thyroid not enlarged, symmetric, no tenderness/mass/nodules Back: symmetric, no curvature. ROM normal. No CVA tenderness. Resp: clear to auscultation bilaterally better air movement today Cardio: regular rate and rhythm, S1, S2 normal, no murmur, click, rub or gallop GI: soft, non-tender; bowel sounds normal; no masses,  no organomegaly Extremities: extremities normal, atraumatic, no cyanosis or edema Pulses: 2+ and symmetric Skin: Skin color, texture, turgor normal. No rashes or lesions Neurologic: Grossly normal. Pain inhibition weakness. Cognitively intact. CN  normal Incision/Wound: wounds clean and intact with staples.    Assessment/Plan: 1. Functional deficits secondary to left intertrochanteric hip fx which require 3+ hours per day of interdisciplinary therapy in a comprehensive inpatient rehab setting. Physiatrist is providing close team supervision and 24 hour management of active medical problems listed below. Physiatrist and rehab team continue to assess barriers to discharge/monitor patient progress toward functional and medical goals. FIM: FIM - Bathing Bathing Steps Patient Completed: Chest;Right Arm;Left Arm;Abdomen;Front perineal area;Buttocks;Right upper leg;Left upper leg;Right lower leg (including foot);Left lower leg (including foot) Bathing: 4: Steadying assist  FIM - Upper Body Dressing/Undressing Upper body dressing/undressing steps patient completed: Thread/unthread right sleeve of pullover shirt/dresss;Thread/unthread left sleeve of pullover shirt/dress;Put head through opening of pull over shirt/dress;Pull shirt over trunk;Thread/unthread right sleeve of front closure shirt/dress;Thread/unthread left sleeve of front closure shirt/dress;Pull shirt around back of front closure shirt/dress;Button/unbutton shirt Upper body dressing/undressing: 5: Supervision: Safety issues/verbal cues FIM - Lower Body Dressing/Undressing Lower body dressing/undressing steps patient completed: Thread/unthread right underwear leg;Thread/unthread left underwear leg;Pull underwear up/down;Thread/unthread left pants leg;Pull pants up/down Lower body dressing/undressing: 3: Mod-Patient completed 50-74% of tasks  FIM - Toileting Toileting steps completed by patient: Adjust clothing prior to toileting;Performs perineal hygiene;Adjust clothing after toileting Toileting Assistive Devices: Grab bar or rail for support Toileting: 6: More than reasonable amount of time  FIM - Diplomatic Services operational officer Devices: Grab bars Toilet Transfers:  6-More than reasonable amt of time;4-To toilet/BSC: Min A (steadying Pt. > 75%);4-From toilet/BSC: Min A (steadying Pt. > 75%)  FIM - Bed/Chair Transfer Bed/Chair Transfer Assistive Devices: Walker (leg lifter) Bed/Chair Transfer: 5: Supine > Sit: Supervision (verbal cues/safety issues);5: Bed > Chair or W/C: Supervision (verbal cues/safety issues)  FIM - Locomotion: Wheelchair Locomotion: Wheelchair: 1: Total Assistance/staff pushes wheelchair (Pt<25%) FIM -  Locomotion: Ambulation Locomotion: Ambulation Assistive Devices: Designer, industrial/product Ambulation/Gait Assistance: 5: Supervision Locomotion: Ambulation: 2: Travels 50 - 149 ft with supervision/safety issues  Comprehension Comprehension Mode: Auditory Comprehension: 5-Understands complex 90% of the time/Cues < 10% of the time  Expression Expression Mode: Verbal Expression: 5-Expresses complex 90% of the time/cues < 10% of the time  Social Interaction Social Interaction: 6-Interacts appropriately with others with medication or extra time (anti-anxiety, antidepressant).  Problem Solving Problem Solving: 6-Solves complex problems: With extra time  Memory Memory: 6-More than reasonable amt of time  1. left displaced intertrochanteric hip fracture. Status post closed reduction and internal fixation with intramedullary nail 09-25-11. Weightbearing as tolerated   -remove stqaples 2. DVT Prophylaxis/Anticoagulation: Subcutaneous Lovenox. Monitor platelet counts any signs of bleeding  3. Pain Management: Tramadol 50 mg every 6 hours as needed and Robaxin as needed. Monitor mental status   -added voltaren gel for OA in hands-with good effect 4. postoperative anemia. Patient has been transfused. hgb 8.8 5. Hypertension Norvasc 5 mg daily, Lotensin 20 mg daily, HCTZ 12.5 mg daily, Lopressor 100 mg twice daily. Good control presently 6. Hypothyroidism. Synthroid  7. Hypokalemia. Need to further replace. Sodium better 8. Klebsiella urinary  tract infection.sensitive to levaquin- recheck urine culture given frequency  -check pvr's also 9. Hypomagnesia follow labs after transfusion 10. Low oxygen sats- encourage IS  -mild pulmonary congestion improved. Off lasix  cxr improved on friday  -oxygen as needed-wean as possible  LOS (Days) 11 A FACE TO FACE EVALUATION WAS PERFORMED  Bill Mcvey T 10/09/2011, 8:09 AM

## 2011-10-09 NOTE — Progress Notes (Signed)
Occupational Therapy Session Note  Patient Details  Name: Misty Shah MRN: [id number] Date of Birth: Oct 13, 1915  Today's Date: 10/09/2011  Short Term Goals: Week 1:  OT Short Term Goal 1 (Week 1): Bathe with Min assist in sit and stand using AE OT Short Term Goal 1 - Progress (Week 1): Met OT Short Term Goal 2 (Week 1): LB Dressing with Mod assist in sit and stand using AE OT Short Term Goal 2 - Progress (Week 1): Met OT Short Term Goal 3 (Week 1): Toileting and toilet Transfers Min assist OT Short Term Goal 3 - Progress (Week 1): Met OT Short Term Goal 4 (Week 1): Simple HM tasks with Min assist with RW OT Short Term Goal 4 - Progress (Week 1): Met  Week 2:  OT Short Term Goal 1 (Week 2): Short Term Goals = Long Term Goals  Skilled Therapeutic Interventions/Progress Updates:  [id number] - 37 Minutes No complaints of pain Upon entering room patient seated in recliner, dressed. Patient stated she already got dressed this am and didn't want to change again. Engaged in functional ambulation from recliner -> elevated toilet seat in bathroom. After toileting patient engaged in static standing for nursing to take out staples focusing on static standing balance/tolerance/endurance. Therapist then propelled patient -> tub room for tub/shower transfer onto tub transfer bench. Patient with more than reasonable amount of time to complete tasks, but at an overall close supervision for transfers and functional ambulation using rolling walker.   Precautions:  Precautions Precautions: Fall Restrictions Weight Bearing Restrictions: No LLE Weight Bearing: Weight bearing as tolerated  See FIM for current functional status  Therapy/Group: Individual Therapy  Mads Borgmeyer 10/09/2011, 12:47 PM

## 2011-10-09 NOTE — Progress Notes (Signed)
Social Work Patient ID: Deborha Payment, female   DOB: Feb 25, 1916, 78 y.o.   MRN: [id number] Met with pt and son, along with daughter on Friday to discuss discharge plans.  Son has bought a IT sales professional for pt.  Gave information regarding reminbursement from Medicare.  He or daughter will be there with pt At discharge, aware she needs 24 hour supervision.  Will arrange Home Health follow up and any DME.  Continue to Work toward discharge Friday.

## 2011-10-10 ENCOUNTER — Inpatient Hospital Stay (HOSPITAL_COMMUNITY): Payer: Medicare Other

## 2011-10-10 DIAGNOSIS — S72009A Fracture of unspecified part of neck of unspecified femur, initial encounter for closed fracture: Secondary | ICD-10-CM

## 2011-10-10 DIAGNOSIS — Z5189 Encounter for other specified aftercare: Secondary | ICD-10-CM

## 2011-10-10 DIAGNOSIS — W19XXXA Unspecified fall, initial encounter: Secondary | ICD-10-CM

## 2011-10-10 LAB — URINE CULTURE
Colony Count: NO GROWTH
Culture  Setup Time: 201305131127
Culture: NO GROWTH

## 2011-10-10 NOTE — Progress Notes (Signed)
Patient ID: Misty Shah, female   DOB: 26-Sep-1915, 78 y.o.   MRN: [id number] Patient ID: Misty Shah, female   DOB: 1915/12/23, 78 y.o.   MRN: [id number] Subjective/Complaints: Still Complains of urinary frequency and burning. Otherwise doing fairly well. All other ROS negative.  Objective: Vital Signs: Blood pressure 155/87, pulse 64, temperature 98.2 F (36.8 C), temperature source Oral, resp. rate 17, height 5\' 2"  (1.575 m), weight 48.2 kg (106 lb 4.2 oz), SpO2 98.00%. No results found.  Basename 10/09/11 1300  WBC 9.2  HGB 9.4*  HCT 28.3*  PLT 467*   No results found for this basename: NA:2,K:2,CL:2,CO2:2,GLUCOSE:2,BUN:2,CREATININE:2,CALCIUM:2 in the last 72 hours CBG (last 3)  No results found for this basename: GLUCAP:3 in the last 72 hours  Wt Readings from Last 3 Encounters:  10/10/11 48.2 kg (106 lb 4.2 oz)  09/25/11 54.432 kg (120 lb)  09/25/11 54.432 kg (120 lb)    Physical Exam:  General appearance: alert, cooperative, appears stated age and no distress Head: Normocephalic, without obvious abnormality, atraumatic Eyes: conjunctivae/corneas clear. PERRL, EOM's intact. Fundi benign. Ears: normal TM's and external ear canals both ears Nose: Nares normal. Septum midline. Mucosa normal. No drainage or sinus tenderness. Throat: lips, mucosa, and tongue normal; teeth and gums normal Neck: no adenopathy, no carotid bruit, no JVD, supple, symmetrical, trachea midline and thyroid not enlarged, symmetric, no tenderness/mass/nodules Back: symmetric, no curvature. ROM normal. No CVA tenderness. Resp: clear to auscultation bilaterally better air movement today Cardio: regular rate and rhythm, S1, S2 normal, no murmur, click, rub or gallop GI: soft, non-tender; bowel sounds normal; no masses,  no organomegaly Extremities: extremities normal, atraumatic, no cyanosis or edema Pulses: 2+ and symmetric Skin: Skin color, texture, turgor normal. No rashes or lesions Neurologic:  Grossly normal. Pain inhibition weakness. Cognitively intact. CN normal Incision/Wound: wounds clean and intact/staples out   Assessment/Plan: 1. Functional deficits secondary to left intertrochanteric hip fx which require 3+ hours per day of interdisciplinary therapy in a comprehensive inpatient rehab setting. Physiatrist is providing close team supervision and 24 hour management of active medical problems listed below. Physiatrist and rehab team continue to assess barriers to discharge/monitor patient progress toward functional and medical goals. FIM: FIM - Bathing Bathing Steps Patient Completed: Chest;Right Arm;Left Arm;Abdomen;Front perineal area;Buttocks;Right upper leg;Left upper leg;Right lower leg (including foot);Left lower leg (including foot) Bathing: 4: Steadying assist  FIM - Upper Body Dressing/Undressing Upper body dressing/undressing steps patient completed: Thread/unthread right sleeve of pullover shirt/dresss;Thread/unthread left sleeve of pullover shirt/dress;Put head through opening of pull over shirt/dress;Pull shirt over trunk;Thread/unthread right sleeve of front closure shirt/dress;Thread/unthread left sleeve of front closure shirt/dress;Pull shirt around back of front closure shirt/dress;Button/unbutton shirt Upper body dressing/undressing: 5: Supervision: Safety issues/verbal cues FIM - Lower Body Dressing/Undressing Lower body dressing/undressing steps patient completed: Thread/unthread right underwear leg;Thread/unthread left underwear leg;Pull underwear up/down;Thread/unthread left pants leg;Pull pants up/down Lower body dressing/undressing: 3: Mod-Patient completed 50-74% of tasks  FIM - Toileting Toileting steps completed by patient: Adjust clothing prior to toileting;Performs perineal hygiene;Adjust clothing after toileting Toileting Assistive Devices: Grab bar or rail for support Toileting: 6: More than reasonable amount of time  FIM - Ambulance person Devices: Grab bars Toilet Transfers: 6-More than reasonable amt of time;4-To toilet/BSC: Min A (steadying Pt. > 75%);4-From toilet/BSC: Min A (steadying Pt. > 75%)  FIM - Bed/Chair Transfer Bed/Chair Transfer Assistive Devices: Walker (Leg lifter) Bed/Chair Transfer: 5: Supine > Sit: Supervision (verbal cues/safety issues);5: Bed > Chair or  W/C: Supervision (verbal cues/safety issues);5: Chair or W/C > Bed: Supervision (verbal cues/safety issues)  FIM - Locomotion: Wheelchair Locomotion: Wheelchair: 1: Travels less than 50 ft with supervision, cueing or coaxing FIM - Locomotion: Ambulation Locomotion: Ambulation Assistive Devices: Designer, industrial/product Ambulation/Gait Assistance: 5: Supervision Locomotion: Ambulation: 2: Travels 50 - 149 ft with supervision/safety issues  Comprehension Comprehension Mode: Auditory Comprehension: 5-Understands complex 90% of the time/Cues < 10% of the time  Expression Expression Mode: Verbal Expression: 5-Expresses complex 90% of the time/cues < 10% of the time  Social Interaction Social Interaction: 6-Interacts appropriately with others with medication or extra time (anti-anxiety, antidepressant).  Problem Solving Problem Solving: 6-Solves complex problems: With extra time  Memory Memory: 6-More than reasonable amt of time  1. left displaced intertrochanteric hip fracture. Status post closed reduction and internal fixation with intramedullary nail 09-25-11. Weightbearing as tolerated   -removed stqaples 2. DVT Prophylaxis/Anticoagulation: Subcutaneous Lovenox. Monitor platelet counts any signs of bleeding  3. Pain Management: Tramadol 50 mg every 6 hours as needed and Robaxin as needed. Monitor mental status   -added voltaren gel for OA in hands-with good effect 4. postoperative anemia. Patient has been transfused. Hemoglobin & Hematocrit     Component Value Date/Time   HGB 9.4* 10/09/2011 1300   HCT 28.3*  10/09/2011 1300     5. Hypertension Norvasc 5 mg daily, Lotensin 20 mg daily, HCTZ 12.5 mg daily, Lopressor 100 mg twice daily. Good control presently 6. Hypothyroidism. Synthroid  7. Hypokalemia. Need to further replace. Sodium better 8. Klebsiella urinary tract infection.sensitive to levaquin- recheck urine culture pending  -pvr's appear low so far.  -consider ditropan, but i don'Shah really want to add this in a 78 yo patient 9. Hypomagnesia follow labs after transfusion 10. Low oxygen sats- encourage IS  -mild pulmonary congestion improved. Off lasix  cxr improved on Friday- recheck today in anticipation of going home 5.17  -oxygen as needed-wean as possible  LOS (Days) 12 A FACE TO FACE EVALUATION WAS PERFORMED  Misty Shah 10/10/2011, 7:39 AM

## 2011-10-10 NOTE — Patient Care Conference (Signed)
Inpatient RehabilitationTeam Conference Note Date: 10/10/2011   Time: 2:15 PM    Patient Name: Misty Shah      Medical Record Number: [id number]  Date of Birth: [birth year] Sex: Female         Room/Bed: 4011/4011-01 Payor Info: Payor: MEDICARE  Plan: MEDICARE PART A AND B  Product Type: *No Product type*     Admitting Diagnosis: LT HIP FX, IM NAILING  Admit Date/Time:  09/28/2011  6:31 PM Admission Comments: No comment available   Primary Diagnosis:  Intertrochanteric fracture of left hip Principal Problem: Intertrochanteric fracture of left hip  Patient Active Problem List  Diagnoses Date Noted  . Intertrochanteric fracture of left hip 09/29/2011  . Hip fracture 09/25/2011  . Hypertension 09/25/2011  . Osteoporosis 09/25/2011  . Hypothyroidism 09/25/2011  . UTI (lower urinary tract infection) 09/25/2011    Expected Discharge Date: Expected Discharge Date: 10/13/11  Team Members Present: Physician: Dr. Faith Rogue Case Manager Present: Lutricia Horsfall, RN Social Worker Present: Dossie Der, LCSW Nurse Present: Daryll Brod, RN PT Present: [name], PT OT Present: Bretta Bang, OT;Patricia Mat Carne, OT SLP Present: Fae Pippin, SLP     Current Status/Progress Goal Weekly Team Focus  Medical   lungs are improving. urinary frequency (baseline?)  wean oxygen,   see prior   Bowel/Bladder   Continent of bowel and bladder  Continent of bowel and bladder  Pt to remain continent of bowel and bladder   Swallow/Nutrition/ Hydration             ADL's   overall supervision with AE, extra time, and verbal cues  overall supervision  D/C planning, family ed, use of AE with education and practice, overall activity tolerance/endurance   Mobility   Supervision to Min assist overall  Generally supervision; min assist for stairs  improved endurance, activity tolerance, balance, transfers and gait (increasing WB of LLE)   Communication             Safety/Cognition/  Behavioral Observations            Pain   Voltaren cream qid to R hand r/t arthritis. Tramadol 50mg  q 6hrs prn, Tylentol 650 mg q 4hrs prn  <3  Monitor for frequency and effectiveness of prn pain medication   Skin   L thigh/hip with staples removed, OTA, unremarkable. BUE, L hip, and Left lower back with visible bruising  No additional skin breakdown  Routine turn q 2hrs      *See Interdisciplinary Assessment and Plan and progress notes for long and short-term goals  Barriers to Discharge: pain, balance, safety    Possible Resolutions to Barriers:  supervision at home    Discharge Planning/Teaching Needs:  HOme with son who is awar ept requires 24 hour care.  Here daily participating in therapies      Team Discussion:  Working on weaning pt off O2. CXR today. Pt's son has observed therapy but no hands-on practice. Pt's daughter educated, was anxious about providing care. Both have been made aware that pt requires 24/7 supervision for safety.  Revisions to Treatment Plan:  none   Continued Need for Acute Rehabilitation Level of Care: The patient requires daily medical management by a physician with specialized training in physical medicine and rehabilitation for the following conditions: Daily direction of a multidisciplinary physical rehabilitation program to ensure safe treatment while eliciting the highest outcome that is of practical value to the patient.: Yes Daily medical management of patient stability for increased activity during  participation in an intensive rehabilitation regime.: Yes Daily analysis of laboratory values and/or radiology reports with any subsequent need for medication adjustment of medical intervention for : Pulmonary problems;Post surgical problems;Other  Meryl Dare 10/10/2011, 2:52 PM

## 2011-10-10 NOTE — Progress Notes (Signed)
Occupational Therapy Session Note  Patient Details  Name: Misty Shah MRN: [id number] Date of Birth: 1916/02/06  Today's Date: 10/10/2011 Time: 4098-1191 Time Calculation (min): 60 min  OT Short Term Goal 1 (Week 2): Short Term Goals = Long Term Goals  Skilled Therapeutic Interventions/Progress Updates:  Treatment emphasis on ADL retraining at sink level, patient refused shower. Focused skilled intervention on UB/LB bathing & dressing using adaptive equipment (reacher, long handled shoe horn, and long handled sponge) prn to increase independence, overall activity tolerance/endurance, dynamic standing balance/tolerance/endurance, and grooming tasks seated at sink in w/c. With use of AE, extra time, and verbal cues/encouragement patient is able to complete UB/LB bathing & dressing at a supervision level. Session completed without nasal cannula 02=88%-96%, encouraged pursed lip breathing when >90%. Left patient seated in w/c beside bed with nasal cannula =1 liter, call bell, and phone within reach.   Precautions:  Precautions Precautions: Fall Restrictions Weight Bearing Restrictions: No LLE Weight Bearing: Weight bearing as tolerated  See FIM for current functional status  Therapy/Group: Individual Therapy  Anitra Doxtater 10/10/2011, 10:19 AM

## 2011-10-10 NOTE — Progress Notes (Signed)
Occupational Therapy Session Note  Patient Details  Name: Misty Shah MRN: [id number] Date of Birth: 10-21-1915  Today's Date: 10/10/2011 Time: 1030-1100 Time Calculation (min): 30 min    Skilled Therapeutic Interventions/Progress Updates: Patient seen this am to address functional mobility and stand tolerance within room.  Patient O2 sats at 96% after 5 min without 02.  Patient with edema in left lower extremity, although reports significant improvement.  Patient walked within room to gather clothing / laundry with min assist, and increased time.     Therapy Documentation Precautions:  Precautions Precautions: Fall Restrictions Weight Bearing Restrictions: No LLE Weight Bearing: Weight bearing as tolerated    Pain:  5/10      See FIM for current functional status  Therapy/Group: Individual Therapy  Collier Salina 10/10/2011, 11:01 AM

## 2011-10-10 NOTE — Progress Notes (Signed)
Social Work Patient ID: Misty Shah, female   DOB: January 12, 1916, 78 y.o.   MRN: [id number] Met with pt and son to discuss team conference goals and family education.  Scheduled Son to come in Thursaday at 11:30 and will go through PT and OT. Informed again pt can not be left Alone.  The team recommends this due to her high risk of falling and falling multiple times at home prior to Admission.  Son reports aware of this.  Pt states: " I am used to being alone and doing for myself."  Need to Work on pt's mind set also.

## 2011-10-10 NOTE — Progress Notes (Signed)
Occupational Therapy Session Note  Patient Details  Name: Misty Shah MRN: [id number] Date of Birth: 1915-08-04  Today's Date: 10/10/2011 Time: 1100-1110; 1130-1150 Time Calculation (min): 10 min; 20 min  Short Term Goals: Week 1:  OT Short Term Goal 1 (Week 1): Bathe with Min assist in sit and stand using AE OT Short Term Goal 1 - Progress (Week 1): Met OT Short Term Goal 2 (Week 1): LB Dressing with Mod assist in sit and stand using AE OT Short Term Goal 2 - Progress (Week 1): Met OT Short Term Goal 3 (Week 1): Toileting and toilet Transfers Min assist OT Short Term Goal 3 - Progress (Week 1): Met OT Short Term Goal 4 (Week 1): Simple HM tasks with Min assist with RW OT Short Term Goal 4 - Progress (Week 1): Met Week 2:  OT Short Term Goal 1 (Week 2): Short Term Goals = Long Term Goals  Skilled Therapeutic Interventions/Progress Updates: Pt scheduled for 30 min of OT, but pt expressed she was very fatigued from previous therapy sessions.  Encouraged pt to participate, but pt declined.  Discussed with pt and son energy conservation for home.  For example bathing right before bed.  Pt provided 20 min rest break.  Pt then worked on functional mobility and self care of toileting and grooming at sink with supervision.  Ambulated to toilet with RW with supervision.   Therapy Documentation Precautions:  Precautions Precautions: Fall Restrictions Weight Bearing Restrictions: No LLE Weight Bearing: Weight bearing as tolerated   Pain: Pain Assessment Pain Assessment: No/denies pain  See FIM for current functional status  Therapy/Group: Individual Therapy  Kysen Wetherington 10/10/2011, 11:57 AM

## 2011-10-10 NOTE — Progress Notes (Signed)
Physical Therapy Session Note  Patient Details  Name: Misty Shah MRN: [id number] Date of Birth: [birth year]  Today's Date: 10/10/2011 Time: 0830-0930 Time Calculation (min): 60 min  Short Term Goals: Week 2:  PT Short Term Goal 1 (Week 2): = LTGs  Skilled Therapeutic Interventions/Progress Updates: transfer from recliner with RW for sit to stand with supervision and gait with supervision to w/c x 15'.  Stair training for home entry and functional strengthening practicing with 1 rail and with both rails up/down 4 steps x 2 reps with min A overall. Mod cues needed for which foot to lead with ascending and descending stairs. Dynamic gait/balance activity and household mobility with RW in ADL apartment kitchen for reaching to different height of shelves, placing items on counter to simulate meal preparation, and putting dishes in the sink, with overall steady A. Discussed with pt need for supervision and some assistance with household chores after d/c due to use of RW for gait and balance.      Therapy Documentation Precautions:  Precautions Precautions: Fall Restrictions Weight Bearing Restrictions: No LLE Weight Bearing: Weight bearing as tolerated  Pain: Premedicated. Leg feeling better today and less edema.  Locomotion : Ambulation Ambulation/Gait Assistance: 5: Supervision   See FIM for current functional status  Therapy/Group: Individual Therapy  [name] [facility] 10/10/2011, 9:43 AM

## 2011-10-10 NOTE — Plan of Care (Signed)
Problem: RH SAFETY Goal: RH STG ADHERE TO SAFETY PRECAUTIONS W/ASSISTANCE/DEVICE STG Adhere to Safety Precautions With Assistance/Device.supervision  Outcome: Progressing No unsafe behavior. Pt calls appropriately for assistance.

## 2011-10-11 NOTE — Progress Notes (Signed)
Reviewed and in agreement with treatment provided.  

## 2011-10-11 NOTE — Progress Notes (Signed)
Physical Therapy Session Note  Patient Details  Name: MALICIA BLASDEL MRN: [id number] Date of Birth: [birth year]  Today's Date: 10/11/2011 Time: 1884-1660 Time Calculation (min): 28 min  Skilled Therapeutic Interventions/Progress Updates:  Tx focused on car transfer training and gait training with RW Pt finishing using toilet upon arrival. Short distance room ambulation with cues to not leave RW behind as approachign sink to wash hands. Pt needing to lean on sink for support standing x88min with close S.  Gait training 1x40' and 1x68' with RW and S with cues for RLE foot clearance as it tends to drag and trip pt. Pt somewhat able to adjust with cues, but not maintain.  Car transfer x1 with RW and x1 stand-pivot with Min A for steadying and lifting LLE into car. Pt needing cues for technique and safe hand placement.     Therapy Documentation Precautions:  Precautions Precautions: Fall Restrictions Weight Bearing Restrictions: No LLE Weight Bearing: Weight bearing as tolerated Pain: no c/o pain, but nauseated.    See FIM for current functional status  Therapy/Group: Individual Therapy  Virl Cagey, PT 10/11/2011, 2:24 PM

## 2011-10-11 NOTE — Progress Notes (Signed)
Physical Therapy Session Note  Patient Details  Name: Misty Shah MRN: [id number] Date of Birth: 1916-03-17  Today's Date: 10/11/2011 Time: 1130-1200 Time Calculation (min): 30 min  Short Term Goals: Week 2:  PT Short Term Goal 1 (Week 2): = LTGs  Skilled Therapeutic Interventions/Progress Updates:  Pt amb 49' with supervision from recliner to w/c.  Treatment emphasized dynamic standing balance and functional ambulation in rolling walker while searching for objects, reaching overhead/laterally, and bending.  Pt performed all dynamic balance/gait activities with supervision, showing no hand preference for UE support.  Pt ambulated >100' with one rest break.  Pt completed Nu-step for 4 minutes at workload 4 for improved endurance and activity tolerance prior to returning to room and sitting in recliner for lunch.  Pt maintained SpO2>94% with activity and no supplemental oxygen through session but reports LE fatigue at end of exercise, requiring min assist to transfer from Nustep to wc.  Therapy Documentation Precautions:  Precautions Precautions: Fall Restrictions Weight Bearing Restrictions: No LLE Weight Bearing: Weight bearing as tolerated Pain: No c/o pain.  See FIM for current functional status  Therapy/Group: Individual Therapy  Deirdre Pippins 10/11/2011, 12:13 PM

## 2011-10-11 NOTE — Progress Notes (Signed)
Subjective/Complaints: Still Complains of urinary frequency and burning. Otherwise doing fairly well. All other ROS negative.  Objective: Vital Signs: Blood pressure 168/76, pulse 66, temperature 98.1 F (36.7 C), temperature source Oral, resp. rate 18, height 5\' 2"  (1.575 m), weight 48.2 kg (106 lb 4.2 oz), SpO2 96.00%. Dg Chest 2 View  10/10/2011  *RADIOLOGY REPORT*  Clinical Data: Short of breath, cough, weakness  CHEST - 2 VIEW  Comparison: Chest x-ray of 10/06/2011  Findings: The lungs are hyperaerated consistent with COPD. Bilateral pleural effusions are present with basilar atelectasis left greater than right.  The right upper lung opacity has further diminished.  The bones are diffusely osteopenic.  Cardiomegaly is stable.  Old left humeral neck fracture is noted with degenerative change of the right shoulder as well.  IMPRESSION:  1.  Bilateral pleural effusions. 2.  COPD. 3.  Some improvement in right upper lobe opacity.  Original Report Authenticated By: Juline Patch, M.D.    Basename 10/09/11 1300  WBC 9.2  HGB 9.4*  HCT 28.3*  PLT 467*   No results found for this basename: NA:2,K:2,CL:2,CO2:2,GLUCOSE:2,BUN:2,CREATININE:2,CALCIUM:2 in the last 72 hours CBG (last 3)  No results found for this basename: GLUCAP:3 in the last 72 hours  Wt Readings from Last 3 Encounters:  10/10/11 48.2 kg (106 lb 4.2 oz)  09/25/11 54.432 kg (120 lb)  09/25/11 54.432 kg (120 lb)    Physical Exam:  General appearance: alert, cooperative, appears stated age and no distress Head: Normocephalic, without obvious abnormality, atraumatic Eyes: conjunctivae/corneas clear. PERRL, EOM's intact. Fundi benign. Ears: normal TM's and external ear canals both ears Nose: Nares normal. Septum midline. Mucosa normal. No drainage or sinus tenderness. Throat: lips, mucosa, and tongue normal; teeth and gums normal Neck: no adenopathy, no carotid bruit, no JVD, supple, symmetrical, trachea midline and thyroid  not enlarged, symmetric, no tenderness/mass/nodules Back: symmetric, no curvature. ROM normal. No CVA tenderness. Resp: clear to auscultation bilaterally better air movement today- exp wheezes Cardio: regular rate and rhythm, S1, S2 normal, no murmur, click, rub or gallop GI: soft, non-tender; bowel sounds normal; no masses,  no organomegaly Extremities: extremities normal, atraumatic, no cyanosis or edema Pulses: 2+ and symmetric Skin: Skin color, texture, turgor normal. No rashes or lesions Neurologic: Grossly normal. Pain inhibition weakness. Cognitively intact. CN normal Incision/Wound: wounds clean and intact/staples out   Assessment/Plan: 1. Functional deficits secondary to left intertrochanteric hip fx which require 3+ hours per day of interdisciplinary therapy in a comprehensive inpatient rehab setting. Physiatrist is providing close team supervision and 24 hour management of active medical problems listed below. Physiatrist and rehab team continue to assess barriers to discharge/monitor patient progress toward functional and medical goals. FIM: FIM - Bathing Bathing Steps Patient Completed: Chest;Right Arm;Left Arm;Abdomen;Front perineal area;Buttocks;Right upper leg;Left upper leg;Right lower leg (including foot);Left lower leg (including foot) Bathing: 4: Steadying assist  FIM - Upper Body Dressing/Undressing Upper body dressing/undressing steps patient completed: Thread/unthread right sleeve of pullover shirt/dresss;Thread/unthread left sleeve of pullover shirt/dress;Put head through opening of pull over shirt/dress;Pull shirt over trunk;Thread/unthread left sleeve of front closure shirt/dress;Thread/unthread right sleeve of front closure shirt/dress;Pull shirt around back of front closure shirt/dress;Button/unbutton shirt Upper body dressing/undressing: 5: Supervision: Safety issues/verbal cues FIM - Lower Body Dressing/Undressing Lower body dressing/undressing steps patient  completed: Thread/unthread right underwear leg;Thread/unthread left underwear leg;Pull underwear up/down;Thread/unthread right pants leg;Pull pants up/down;Don/Doff right shoe;Don/Doff left shoe Lower body dressing/undressing: 5: Supervision: Safety issues/verbal cues  FIM - Toileting Toileting steps completed by patient:  Adjust clothing prior to toileting;Performs perineal hygiene;Adjust clothing after toileting Toileting Assistive Devices: Grab bar or rail for support Toileting: 6: More than reasonable amount of time  FIM - Diplomatic Services operational officer Devices: Grab bars Toilet Transfers: 6-More than reasonable amt of time;4-To toilet/BSC: Min A (steadying Pt. > 75%);4-From toilet/BSC: Min A (steadying Pt. > 75%)  FIM - Bed/Chair Transfer Bed/Chair Transfer Assistive Devices: Walker (Leg lifter) Bed/Chair Transfer: 5: Supine > Sit: Supervision (verbal cues/safety issues);5: Bed > Chair or W/C: Supervision (verbal cues/safety issues);5: Chair or W/C > Bed: Supervision (verbal cues/safety issues)  FIM - Locomotion: Wheelchair Locomotion: Wheelchair: 1: Total Assistance/staff pushes wheelchair (Pt<25%) FIM - Locomotion: Ambulation Locomotion: Ambulation Assistive Devices: Designer, industrial/product Ambulation/Gait Assistance: 5: Supervision Locomotion: Ambulation: 2: Travels 50 - 149 ft with supervision/safety issues  Comprehension Comprehension Mode: Auditory Comprehension: 5-Understands complex 90% of the time/Cues < 10% of the time  Expression Expression Mode: Verbal Expression: 5-Expresses complex 90% of the time/cues < 10% of the time  Social Interaction Social Interaction: 6-Interacts appropriately with others with medication or extra time (anti-anxiety, antidepressant).  Problem Solving Problem Solving: 6-Solves complex problems: With extra time  Memory Memory: 6-More than reasonable amt of time  1. left displaced intertrochanteric hip fracture. Status post closed  reduction and internal fixation with intramedullary nail 09-25-11. Weightbearing as tolerated   -removed staples 2. DVT Prophylaxis/Anticoagulation: Subcutaneous Lovenox. Monitor platelet counts any signs of bleeding  3. Pain Management: Tramadol 50 mg every 6 hours as needed and Robaxin as needed. Monitor mental status   -added voltaren gel for OA in hands-with good effect 4. postoperative anemia. Patient has been transfused. Hemoglobin & Hematocrit     Component Value Date/Time   HGB 9.4* 10/09/2011 1300   HCT 28.3* 10/09/2011 1300     5. Hypertension Norvasc 5 mg daily, Lotensin 20 mg daily, HCTZ 12.5 mg daily, Lopressor 100 mg twice daily. Good control presently 6. Hypothyroidism. Synthroid  7. Hypokalemia. Need to further replace. Sodium better 8. Klebsiella urinary tract infection.sensitive to levaquin- recheck urine culture negative  -pvr's appear low so far.  -consider ditropan, but i don't really want to add this in a 78 yo patient- fluid "ration" 9. Hypomagnesia follow labs after transfusion 10. Low oxygen sats- encourage IS  -mild pulmonary congestion improved. Off lasix  cxr improved further yesterday  -oxygen- dc   LOS (Days) 13 A FACE TO FACE EVALUATION WAS PERFORMED  Misty Shah T 10/11/2011, 7:37 AM

## 2011-10-11 NOTE — Progress Notes (Signed)
Physical Therapy Session Note  Patient Details  Name: Misty Shah MRN: [id number] Date of Birth: 07/11/1915  Today's Date: 10/11/2011 Time: 4098-1191 Time Calculation (min): 45 min  Short Term Goals: Week 2:  PT Short Term Goal 1 (Week 2): = LTGs  Skilled Therapeutic Interventions/Progress Updates:  Pt received seated in wc without O2 [place] on.  Pt's resting SpO2 without supplemental O2= 97%.  Treatment emphasized dynamic standing balance at sink and ambulation ~40' in RW with supervision (SpO2=89% without supplemental O2.  Pt able to increase SpO2 with pursed lip breathing). Introduced and educated patient on [place] exercises and pt performed 1x10 of each exercise bilaterally with UE support at counter height.  Pt required increased LUE support (onto elbow) with RLE exercises.  Pt performed all tasks with supervision and tolerated new exercises well.  Pt given and review exercise sheets, stressing the importance of continuing these in the home with supervision.  Discussed discharge plan with pt and the level of assitance she will most likely be needing.   Kept supplemental O2 off for entire treatment at pt was able to maintain SpO2 >93% with therex.  Pt returned to room and left in recliner with call bell in reach.  Therapy Documentation Precautions:  Precautions Precautions: Fall Restrictions Weight Bearing Restrictions: No LLE Weight Bearing: Weight bearing as tolerated Pain: No c/o pain.  See FIM for current functional status  Therapy/Group: Individual Therapy  Deirdre Pippins 10/11/2011, 10:59 AM

## 2011-10-11 NOTE — Progress Notes (Signed)
Occupational Therapy Session Notes  Patient Details  Name: SHONNA DEITER MRN: [id number] Date of Birth: 02-18-1916  Today's Date: 10/11/2011  OT Short Term Goal 1 (Week 2): Short Term Goals = Long Term Goals  Skilled Therapeutic Interventions/Progress Updates:   Session #1 [phone] - 21 Minutes Individual Therapy No complaints of pain Upon entering room patient supine in bed. Engaged in bed mobility for functional ambulation <-> bathroom using rolling walker for toilet transfer and toileting. Patient then performed ADLs at sink level in sit->stand position. Patient at an overall supervision level for basic self-care tasks, but with extra time to complete tasks. Patient requires encouragement and motivation to optimize independence. Discussed need for 24/7 supervision at discharge, patient continues to state "I'm very independent, I can do things for myself"; emphasized patients many falls and for her safety 24/7 supervision is recommended.   -------------------------------------------------------------------------------------------------------------------------------------------------------  Session #2 1430-1500 - 30 Minutes Individual Therapy No complaints of pain Therapeutic activity focusing on simple meal preparation in ADL kitchen. Patient prepared jello, ambulating using rolling walker. Patient required min assist to complete task secondary to decrease balance and decrease independence with sit/stands from low surfaces.   Precautions:  Precautions Precautions: Fall Restrictions Weight Bearing Restrictions: No LLE Weight Bearing: Weight bearing as tolerated  See FIM for current functional status  Bleu Minerd 10/11/2011, 8:04 AM

## 2011-10-12 NOTE — Progress Notes (Signed)
Per State Regulation 482.30 This chart was reviewed for medical necessity with respect to the patient's Admission/Duration of stay. Lane-M[place], Shamere Dilworth Anne                 Nurse Care Manager               

## 2011-10-12 NOTE — Progress Notes (Signed)
Reviewed and in agreement with treatment provided.  

## 2011-10-12 NOTE — Progress Notes (Signed)
Physical Therapy Discharge Summary  Patient Details  Name: Misty Shah MRN: [id number] Date of Birth: 1915-07-08  Today's Date: 10/12/2011  Session 1Time: 1130-1200  Time Calculation (min): 30 min   Individual therapy session. No c/o pain. Pt seated in recliner with son present for family education session. Discussed home safety techniques and need for 24/7 supervision with son. Instructed pt on how to provide supervision/gaurding for patient during transfers and ambulation with RW. Pt and son performed sit<-->stand transfers and ambulation safely. Educated pt and son on assistance with stairs, frequent verbal cues needed for leading with RLE upon ascent and leading with LLE upon descent. Provided handout to son as a reminder. Pt and son demonstrated safe car transfer with and without RW using leglift for LLE. Discussed home set up such as keeping items pt uses frequently easily accessible. Pt and son demonstrated understanding and all questions answered.   Session 2 Time: 4098-1191  Time Calculation: 45 min   Individual therapy session. No c/o of pain. Pt amb 15' from recliner to wc. Treatment emphasized bed mobility (supervision) using leg lift with LLE. Discussed home safety with patient such as adequate lighting and using BSC at night. Also discussed energy conservation techniques around the home. Pt performed hamstring curls and hip abduction in standing at counter as part of HEP. Pt amb 75' at 2.64ft/sec pace. Pt returned to room and seated in recliner with call bell in reach.  Patient has met 6 of 6 long term goals due to improved activity tolerance, improved balance, increased strength, increased range of motion, decreased pain and functional use of left lower extremity. Patient to discharge at an ambulatory level Supervision, with min assist level car transfers. Patient's care partner is independent to provide the necessary physical assistance at discharge.   Reasons goals not met: Pt met  all goals   Recommendation:  Patient will benefit from ongoing skilled PT services in home health setting to continue to advance safe functional mobility, address ongoing impairments in strengthening, balance, endurance, and minimize fall risk.   Equipment:  No equipment provided. Pt already owns rolling walker, family purchased transport chair and adaptive equipment.   Reasons for discharge: treatment goals met and discharge from hospital  Patient/family agrees with progress made and goals achieved: Yes   Patient/family agrees with progress made and goals achieved: Yes  PT Discharge Precautions/Restrictions Precautions Precautions: Fall Restrictions Weight Bearing Restrictions: Yes LLE Weight Bearing: Weight bearing as tolerated  Vision/Perception  Praxis Praxis: Intact   Sensation Sensation Light Touch: Appears Intact  Mobility Transfers Sit to Stand: 5: Supervision (with rolling walker) Stand to Sit: 5: Supervision (with rolling walker) Stand Pivot Transfers: 5: Supervision (with rolling walker)  Locomotion  Ambulation Ambulation: Yes Ambulation/Gait Assistance: 5: Supervision Ambulation Distance (Feet): 75 Feet Assistive device: Rolling walker Gait Gait: Yes Gait Pattern: Impaired Gait Pattern: Decreased weight shift to left;Decreased step length - right;Decreased step length - left;Trunk flexed Gait velocity: 2.10 ft/sec Stairs / Additional Locomotion Stairs: Yes Stairs Assistance: 4: Min assist Stairs Assistance Details: Verbal cues for sequencing;Verbal cues for technique;Verbal cues for precautions/safety Stair Management Technique: Two rails Wheelchair Mobility Wheelchair Mobility: No   Trunk/Postural Assessment  Cervical Assessment Cervical Assessment:  (forward head) Thoracic Assessment Thoracic Assessment: Exceptions to [facility] (kyphotic) Lumbar Assessment Lumbar Assessment:  ( flexed posture)   Balance Balance Balance Assessed: Yes Static  Sitting Balance Static Sitting - Balance Support: Bilateral upper extremity supported;Feet supported Static Sitting - Level of Assistance: 6: Modified independent (  Device/Increase time)  Extremity Assessment  RLE Assessment RLE Assessment: Exceptions to [facility] RLE AROM (degrees) RLE Overall AROM Comments: WFL RLE Strength RLE Overall Strength: Deficits (grossly 4/5) RLE Overall Strength Comments:  (Generalized deconditioning) LLE Assessment LLE Assessment: Exceptions to WFL LLE AROM (degrees) LLE Overall AROM Comments:  (decreased active hip flexion) LLE Strength LLE Overall Strength: Deficits LLE Overall Strength Comments:  (Grossly 3/5, weaker than RLE.  limited hip flex/abd dt pain)  See FIM for current functional status  Deirdre Pippins 10/12/2011, 3:57 PM

## 2011-10-12 NOTE — Progress Notes (Signed)
Patient ID: Misty Shah, female   DOB: 19-Jul-1915, 78 y.o.   MRN: [id number] Subjective/Complaints: Still Complains of urinary frequency. Feels nauseas and "not right" because of abx. All other ROS negative.  Objective: Vital Signs: Blood pressure 173/75, pulse 91, temperature 98.1 F (36.7 C), temperature source Oral, resp. rate 18, height 5\' 2"  (1.575 m), weight 48.2 kg (106 lb 4.2 oz), SpO2 92.00%. Dg Chest 2 View  10/10/2011  *RADIOLOGY REPORT*  Clinical Data: Short of breath, cough, weakness  CHEST - 2 VIEW  Comparison: Chest x-ray of 10/06/2011  Findings: The lungs are hyperaerated consistent with COPD. Bilateral pleural effusions are present with basilar atelectasis left greater than right.  The right upper lung opacity has further diminished.  The bones are diffusely osteopenic.  Cardiomegaly is stable.  Old left humeral neck fracture is noted with degenerative change of the right shoulder as well.  IMPRESSION:  1.  Bilateral pleural effusions. 2.  COPD. 3.  Some improvement in right upper lobe opacity.  Original Report Authenticated By: Juline Patch, M.D.    Basename 10/09/11 1300  WBC 9.2  HGB 9.4*  HCT 28.3*  PLT 467*   No results found for this basename: NA:2,K:2,CL:2,CO2:2,GLUCOSE:2,BUN:2,CREATININE:2,CALCIUM:2 in the last 72 hours CBG (last 3)  No results found for this basename: GLUCAP:3 in the last 72 hours  Wt Readings from Last 3 Encounters:  10/10/11 48.2 kg (106 lb 4.2 oz)  09/25/11 54.432 kg (120 lb)  09/25/11 54.432 kg (120 lb)    Physical Exam:  General appearance: alert, cooperative, appears stated age and no distress Head: Normocephalic, without obvious abnormality, atraumatic Eyes: conjunctivae/corneas clear. PERRL, EOM's intact. Fundi benign. Ears: normal TM's and external ear canals both ears Nose: Nares normal. Septum midline. Mucosa normal. No drainage or sinus tenderness. Throat: lips, mucosa, and tongue normal; teeth and gums normal Neck: no  adenopathy, no carotid bruit, no JVD, supple, symmetrical, trachea midline and thyroid not enlarged, symmetric, no tenderness/mass/nodules Back: symmetric, no curvature. ROM normal. No CVA tenderness. Resp: clear to auscultation bilaterally better air movement today- exp wheezes Cardio: regular rate and rhythm, S1, S2 normal, no murmur, click, rub or gallop GI: soft, non-tender; bowel sounds normal; no masses,  no organomegaly Extremities: extremities normal, atraumatic, no cyanosis or edema Pulses: 2+ and symmetric Skin: Skin color, texture, turgor normal. No rashes or lesions Neurologic: Grossly normal. Pain inhibition weakness. Cognitively intact. CN normal Incision/Wound: wounds clean and intact/staples out Mood: remains a little anxious at times.   Assessment/Plan: 1. Functional deficits secondary to left intertrochanteric hip fx which require 3+ hours per day of interdisciplinary therapy in a comprehensive inpatient rehab setting. Physiatrist is providing close team supervision and 24 hour management of active medical problems listed below. Physiatrist and rehab team continue to assess barriers to discharge/monitor patient progress toward functional and medical goals. FIM: FIM - Bathing Bathing Steps Patient Completed: Chest;Right Arm;Left Arm;Abdomen;Front perineal area;Buttocks;Right upper leg;Left upper leg;Right lower leg (including foot);Left lower leg (including foot) Bathing: 5: Set-up assist to: Obtain items  FIM - Upper Body Dressing/Undressing Upper body dressing/undressing steps patient completed: Thread/unthread right sleeve of pullover shirt/dresss;Thread/unthread left sleeve of pullover shirt/dress;Put head through opening of pull over shirt/dress;Pull shirt over trunk;Thread/unthread right sleeve of front closure shirt/dress;Thread/unthread left sleeve of front closure shirt/dress;Pull shirt around back of front closure shirt/dress;Button/unbutton shirt Upper body  dressing/undressing: 5: Set-up assist to: Obtain clothing/put away FIM - Lower Body Dressing/Undressing Lower body dressing/undressing steps patient completed: Thread/unthread right underwear leg;Thread/unthread left  underwear leg;Pull underwear up/down;Thread/unthread right pants leg;Thread/unthread left pants leg;Pull pants up/down;Don/Doff right shoe;Don/Doff left shoe Lower body dressing/undressing: 5: Set-up assist to: Obtain clothing  FIM - Toileting Toileting steps completed by patient: Adjust clothing prior to toileting;Performs perineal hygiene;Adjust clothing after toileting Toileting Assistive Devices: Grab bar or rail for support Toileting: 5: Supervision: Safety issues/verbal cues  FIM - Diplomatic Services operational officer Devices: Bedside commode Toilet Transfers: 4-To toilet/BSC: Min A (steadying Pt. > 75%);4-From toilet/BSC: Min A (steadying Pt. > 75%)  FIM - Bed/Chair Transfer Bed/Chair Transfer Assistive Devices: Therapist, occupational: 5: Bed > Chair or W/C: Supervision (verbal cues/safety issues)  FIM - Locomotion: Wheelchair Locomotion: Wheelchair: 1: Total Assistance/staff pushes wheelchair (Pt<25%) FIM - Locomotion: Ambulation Locomotion: Ambulation Assistive Devices: Designer, industrial/product Ambulation/Gait Assistance: 5: Supervision Locomotion: Ambulation: 2: Travels 50 - 149 ft with supervision/safety issues  Comprehension Comprehension Mode: Auditory Comprehension: 5-Understands complex 90% of the time/Cues < 10% of the time  Expression Expression Mode: Verbal Expression: 5-Expresses complex 90% of the time/cues < 10% of the time  Social Interaction Social Interaction: 6-Interacts appropriately with others with medication or extra time (anti-anxiety, antidepressant).  Problem Solving Problem Solving: 5-Solves complex 90% of the time/cues < 10% of the time  Memory Memory: 5-Recognizes or recalls 90% of the time/requires cueing < 10% of the  time  1. left displaced intertrochanteric hip fracture. Status post closed reduction and internal fixation with intramedullary nail 09-25-11. Weightbearing as tolerated   -removed staples 2. DVT Prophylaxis/Anticoagulation: Subcutaneous Lovenox. Monitor platelet counts any signs of bleeding  3. Pain Management: Tramadol 50 mg every 6 hours as needed and Robaxin as needed. Monitor mental status   -added voltaren gel for OA in hands-with good effect 4. postoperative anemia. Patient has been transfused. Hemoglobin & Hematocrit     Component Value Date/Time   HGB 9.4* 10/09/2011 1300   HCT 28.3* 10/09/2011 1300     5. Hypertension Norvasc 5 mg daily, Lotensin 20 mg daily, HCTZ 12.5 mg daily, Lopressor 100 mg twice daily. Good control presently 6. Hypothyroidism. Synthroid  7. Hypokalemia. Need to further replace. Sodium better 8. Klebsiella urinary tract infection. rx'ed  -pvr's appear low so far.  -consider ditropan, but i don't really want to add this in a 78 yo patient- fluid "ration" 9. Hypomagnesia follow labs after transfusion 10.Pulmonary- encourage IS  -recent cxr shows further improvement  -oxygen- dc   -dc levaquin for ?pna. Due to tolerance issues  LOS (Days) 14 A FACE TO FACE EVALUATION WAS PERFORMED  Jahaira Earnhart T 10/12/2011, 8:19 AM

## 2011-10-12 NOTE — Discharge Summary (Signed)
  Discharge summary job # [id number]

## 2011-10-12 NOTE — Progress Notes (Signed)
Physical Therapy Note  Patient Details  Name: Misty Shah MRN: [id number] Date of Birth: February 06, 1916 Today's Date: 10/12/2011  Time: 1330-1400 30 minutes  Pt c/o pain 7/10 in L LE, RN made aware.  Gait short distances due to pt c/o fatigue and pain with RW, supervision.  Nu step for UE/LE strength and endurance 2 x 5 minutes.  Pt able to fully participate in full 30 minute session despite c/o pain and fatigue.  Individual therapy   Aida Lemaire 10/12/2011, 3:21 PM

## 2011-10-12 NOTE — Progress Notes (Signed)
Social Work Patient ID: Misty Shah, female   DOB: 1915/06/27, 78 y.o.   MRN: [id number]  Son here to go through therapies with pt and it went well.  Aware pt requires 24 hour supervision and should no be left alone. He reports someone will always be there with her.  Has all DME.  Ready for discharge tomorrow.

## 2011-10-12 NOTE — Progress Notes (Signed)
Occupational Therapy Session Notes & Discharge Summary  Patient Details  Name: Misty Shah MRN: [id number] Date of Birth: May 02, 1916  Today's Date: 10/12/2011  SESSION NOTES  Session #1 [id number] - 55 Minutes Individual Therapy No complaints of pain Treatment emphasis on ADL retraining at sink level in sit->stand position. Focused skilled intervention on UB/LB bathing & dressing using AE prn to increase independence, sit/stands, overall activity tolerance/endurance, dynamic standing balance/tolerance/endurance, and grooming tasks seated at sink. Patient at an overall supervision level for basic self-care tasks with use of adaptive equipment and assistive devices.   ---------------------------------------------------------------------------------------------------------------------------------------------------------  Session #2 1300-1330 - 30 Minutes Individual Therapy No complaints of pain Treatment focus on family education with patients son. Engaged in functional ambulation with rolling walker throughout ADL apartment. Educated patient and patients son on tub/shower transfers on/off tub transfer bench AND other basic ADLs (for more information see tab titled Patient Education). Patients son performed hands on prn and problem solved through tasks with patient.   ---------------------------------------------------------------------------------------------------------------------------------------------------------  DISCHARGE SUMMARY Patient has met 9 of 10 long term goals due to improved activity tolerance, improved balance, postural control, ability to compensate for deficits and improved coordination.  Patient to discharge at overall Supervision level.  Patient's care partner is independent to provide the necessary supervision assistance at discharge.    Reasons goals not met: Patient did not meet LTG of simple meal prep. Patient requires min assist for simple meal prep secondary to  decreased awareness, decrease judgement, decreased dynamic standing balance/tolerance/endurance, and decreased overall activity tolerance/endurance.   Recommendation:  Patient will benefit from ongoing skilled OT services in home health setting to continue to advance functional skills in the area of BADL and iADL.  Equipment: No equipment needed at this time. Patient and patients son report that patient has a tub transfer bench and a BSC.   Reasons for discharge: treatment goals met and discharge from hospital  Patient/family agrees with progress made and goals achieved: Yes  Precautions/Restrictions  Precautions Precautions: Fall Restrictions Weight Bearing Restrictions: Yes LLE Weight Bearing: Weight bearing as tolerated  Pain Pain Assessment Pain Assessment: No/denies pain Pain Score: 0-No pain Faces Pain Scale: No hurt  ADL - See FIM  Vision/Perception  Vision - History Baseline Vision: No visual deficits Patient Visual Report: No change from baseline Vision - Assessment Eye Alignment: Within Functional Limits Perception Perception: Within Functional Limits Praxis Praxis: Intact   Cognition Overall Cognitive Status: Appears within functional limits for tasks assessed Arousal/Alertness: Awake/alert Orientation Level: Oriented X4 Memory: Appears intact (for age) Awareness: Appears intact Problem Solving: Appears intact Safety/Judgment: Appears intact  Sensation Sensation Light Touch: Appears Intact Coordination Gross Motor Movements are Fluid and Coordinated: Yes Fine Motor Movements are Fluid and Coordinated: Yes (patient with complaints of arthritis and minimal fine motor)  Motor  Motor Motor: Within Functional Limits  Mobility - See Discharge Navigator   Trunk/Postural Assessment - See Discharge Navigator   Balance- See Discharge Navigator   Extremity/Trunk Assessment RUE Assessment RUE Assessment: Exceptions to [facility] RUE AROM (degrees) RUE  Overall AROM Comments: Same as admission RUE Strength RUE Overall Strength Comments: Same as admission LUE Assessment LUE Assessment: Exceptions to [facility] LUE AROM (degrees) LUE Overall AROM Comments: Same as admission LUE Strength LUE Overall Strength Comments: Same as admission  See FIM for current functional status  Paul Torpey 10/12/2011, 7:56 AM

## 2011-10-12 NOTE — Discharge Summary (Signed)
NAMEVICIE, CECH                  ACCOUNT NO.:  [id number]  MEDICAL RECORD NO.:  [id number]  LOCATION:  4011                         FACILITY:  [facility]  PHYSICIAN:  Ranelle Oyster, M.D.DATE OF BIRTH:  [birth year]  DATE OF ADMISSION:  09/29/2011 DATE OF DISCHARGE:  10/13/2011                              DISCHARGE SUMMARY   DISCHARGE DIAGNOSES: 1. Left displaced intertrochanteric hip fracture with closed reduction     and internal fixation with intramedullary nail on September 25, 2011. 2. Subcutaneous Lovenox for deep venous thrombosis prophylaxis, pain     management. 3. Hypertension. 4. Klebsiella urinary tract infection, resolved. 5. Hypothyroidism. 6. Hypokalemia.  This 78 year old right-handed female admitted on April 29, after a fall. The patient lives alone, uses a walker at times.  The patient had multiple falls in the past.  Sustained a left displaced intertrochanteric hip fracture.  Underwent closed reduction, internal fixation with intramedullary nail on September 25, 2011, per Dr. Cleophas Dunker. Weightbearing as tolerated with subcutaneous Lovenox for DVT prophylaxis.  Postoperative anemia 7.7 and transfused.  Postoperative hypokalemia 2.7 with supplement added.  Klebsiella urinary tract infection, initially placed on intravenous Rocephin and changed to p.o. antibiotics.  The patient was admitted for comprehensive rehab program.  PAST MEDICAL HISTORY:  See discharge diagnoses.  SOCIAL HISTORY:  Lives alone with family support.  FUNCTIONAL HISTORY:  Prior to admission, was independent with a walker.  FUNCTIONAL STATUS:  Upon admission to rehab services, was minimum to moderate assist for functional mobility, transfers, ambulating short distances.  PHYSICAL EXAM:  VITAL SIGNS: Blood pressure 137/56, pulse 94, temperature 99, respirations 18. GENERAL: This was an alert female, in no acute distress. LUNGS: Clear to auscultation. CARDIAC: Regular rate and rhythm. ABDOMEN:  Soft, nontender.  Good bowel sounds. SKIN: Left hip incision clean and intact with staples.  REHABILITATION HOSPITAL COURSE:  The patient was admitted to inpatient rehab services with therapies initiated on a 3 hour daily basis consisting of physical therapy, occupational therapy, and rehabilitation nursing.  The following issues were addressed during the patient's rehabilitation stay.  Pertaining to Mrs. Nihiser's left displaced intertrochanteric hip fracture, she had undergone close reduction and internal fixation with intramedullary nail on September 25, 2011, per Dr. Cleophas Dunker.  She is weightbearing as tolerated.  Neurovascular sensation intact.  She remained on subcutaneous Lovenox for DVT prophylaxis.  She was using tramadol on a limited basis for pain as well as Robaxin. Postoperative anemia stable with latest hemoglobin of 8.8.  No bleeding episodes noted.  Blood pressure is well controlled on metoprolol and Norvasc with no orthostatic changes.  She had completed a course of Levaquin for Klebsiella urinary tract infection without dysuria or hematuria.  She remained on hormone supplement for hypothyroidism.  The patient with weekly collaborative interdisciplinary team conferences to discuss estimated length of stay, family teaching, and any barriers to her discharge.  She was continent of bowel and bladder.  Overall supervision for activities of daily living needing some extra time, supervision minimal assist overall for functional mobility.  Strength and endurance greatly improved.  Full family teaching was completed with her family.  She was discharged home with ongoing therapies dictated as  per rehab services.  DISCHARGE MEDICATIONS:  Included: 1. Xanax 0.25 mg twice daily as needed. 2. Norvasc 5 mg p.o. daily. 3. Aspirin 81 mg p.o. daily. 4. Synthroid 100 mcg p.o. daily. 5. Robaxin 500 mg p.o. every 6 hours as needed for muscle spasms. 6. Lopressor 100 mg b.i.d. 7. Ultram 50 mg  every 6 hours as needed for pain, dispense 60 tablets.  DIET:  Her diet was regular.  SPECIAL INSTRUCTIONS:  Weightbearing as tolerated.  Continue therapies as advised per rehab services.  The patient should follow up with Dr. Norlene Campbell orthopedic service, call for appointment; Dr. Adela Lank, medical management; Dr. Harold Hedge at the outpatient rehab service office as needed.     Mariam Dollar, P.A.   ______________________________ Ranelle Oyster, M.D.    DA/MEDQ  D:  10/12/2011  T:  10/12/2011  Job:  [id number]  cc:   Adela Lank, Dr. Claude Manges. Cleophas Dunker, M.D.

## 2011-10-12 NOTE — Discharge Instructions (Signed)
Inpatient Rehab Discharge Instructions  YOONA ISHII Discharge date and time: No discharge date for patient encounter.   Activities/Precautions/ Functional Status: Activity: activity as tolerated Diet: regular diet Wound Care: keep wound clean and dry Functional status:  ___ No restrictions     ___ Walk up steps independently __x_ 24/7 supervision/assistance   __x_ Walk up steps with assistance ___ Intermittent supervision/assistance  ___ Bathe/dress independently ___ Walk with walker     ___ Bathe/dress with assistance ___ Walk Independently    ___ Shower independently _x__ Walk with assistance    __x_ Shower with assistance ___ No alcohol     ___ Return to work/school ________  Special Instructions: Weightbearing as tolerated  COMMUNITY REFERRALS UPON DISCHARGE:    Home Health:   PT,RN, OT  Agency:[facility] Phone:[phone] Date of last service:10/13/2011  Medical Equipment/Items Ordered:NONE HAD FROM PREVIOUS ADMITS   GENERAL COMMUNITY RESOURCES FOR PATIENT/FAMILY: INFORMATION GIVEN REGARDING ASSISTED LIVING FACILITIES AND PRIVATE HIRE AGENCIES   My questions have been answered and I understand these instructions. I will adhere to these goals and the provided educational materials after my discharge from the hospital.  Patient/Caregiver Signature _______________________________ Date __________  Clinician Signature _______________________________________ Date __________  Please bring this form and your medication list with you to all your follow-up doctor's appointments.

## 2011-10-12 NOTE — Care Management (Signed)
Portable Health Profile   Name: Misty Shah       Date Initiated: 10/12/2011 DOB: 1916/04/25  Age: 78 y.o. Address: [address] [place] [address]  Phone:  [phone]  Emergency Contact   Contact Information    Name Relation Home Work Mobile   Ronee, Ranganathan [phone]  [phone]      Insurance   [facility] Account [id number]   CVG-F/O Precert Number Precert Contact Precert Agency Phone   1. MEDICARE MEDICARE PART A AND B      2. AARP AARP         Allergies   Allergies  Allergen Reactions  . Codeine Other (See Comments)    unknown  . Epinephrine Other (See Comments)    unknown  . Sulfur Other (See Comments)    unknown  . Tetanus Toxoids Other (See Comments)    unknown    Immunizations on file   There is no immunization history on file for this patient.  Primary Care Provider  Dr. Michiel Sites, MD   (Endocrinology)  Other Physicians  Dr Norlene Campbell  [phone] Other Healthcare Providers/Preference    Hospital Preference    Diagnosis/Conditions from this Admission  Left hip fracture due to fall  Risk Factors/Limitations: vision, hearing, swallowing    Special Instructions  24/7 supervision  Take your Portable Health Profile, discharge instructions and medication list to all medical appointments.  List changes/additions below

## 2011-10-13 MED ORDER — DICLOFENAC SODIUM 1 % TD GEL: 1.0000 "application " | Freq: Four times a day (QID) | TRANSDERMAL | Status: DC

## 2011-10-13 MED ORDER — ALPRAZOLAM 0.25 MG PO TABS: 0.2500 mg | ORAL_TABLET | Freq: Two times a day (BID) | ORAL | Status: AC | PRN

## 2011-10-13 NOTE — Progress Notes (Signed)
Social Work Discharge Note Discharge Note  The overall goal for the admission was met for:   Discharge location: Yes-HOME WITH FAMILY PROVIDING 24 HOUR SUPERVISION  Length of Stay: Yes-15 DAYS  Discharge activity level: Yes-SUPERVISION LEVEL  Home/community participation: Yes  Services provided included: MD, RD, PT, OT, RN, CM, TR, Pharmacy and SW  Financial Services: Medicare and Private Insurance: AARP-SECONDARY  Follow-up services arranged: Home Health: [facility]-PT, OT,RN and Patient/Family has no preference for HH/DME agencies  Comments (or additional information):FAMILY AWARE PT REQUIRES 24 HR SUPERVISION LEVEL DUE TO HISTORY OF FALLS PRIOR TO ADMISSION SON AWARE AND REPORTS SHE WILL HAVE THIS AT HOME  Patient/Family verbalized understanding of follow-up arrangements: Yes  Individual responsible for coordination of the follow-up plan: BUDDY-SON  Confirmed correct DME delivered: Lucy Chris 10/13/2011    Lucy Chris

## 2011-10-13 NOTE — Progress Notes (Signed)
Pt discharged home with family. Discharge instructions provided by Harvel Ricks, PA. Pt escorted off unit in w/c with personal belonging by Lelon Mast, NT.

## 2011-10-13 NOTE — Progress Notes (Signed)
Patient ID: Misty Shah, female   DOB: [birth year], 78 y.o.   MRN: [id number] Subjective/Complaints: Still Complains of urinary frequency. Feels nauseas and "not right" because of abx. All other ROS negative.  Objective: Vital Signs: Blood pressure 150/70, pulse 66, temperature 98.2 F (36.8 C), temperature source Oral, resp. rate 17, height 5\' 2"  (1.575 m), weight 49.5 kg (109 lb 2 oz), SpO2 93.00%. No results found. No results found for this basename: WBC:2,HGB:2,HCT:2,PLT:2 in the last 72 hours No results found for this basename: NA:2,K:2,CL:2,CO2:2,GLUCOSE:2,BUN:2,CREATININE:2,CALCIUM:2 in the last 72 hours CBG (last 3)  No results found for this basename: GLUCAP:3 in the last 72 hours  Wt Readings from Last 3 Encounters:  10/13/11 49.5 kg (109 lb 2 oz)  09/25/11 54.432 kg (120 lb)  09/25/11 54.432 kg (120 lb)    Physical Exam:  General appearance: alert, cooperative, appears stated age and no distress Head: Normocephalic, without obvious abnormality, atraumatic Eyes: conjunctivae/corneas clear. PERRL, EOM's intact. Fundi benign. Ears: normal TM's and external ear canals both ears Nose: Nares normal. Septum midline. Mucosa normal. No drainage or sinus tenderness. Throat: lips, mucosa, and tongue normal; teeth and gums normal Neck: no adenopathy, no carotid bruit, no JVD, supple, symmetrical, trachea midline and thyroid not enlarged, symmetric, no tenderness/mass/nodules Back: symmetric, no curvature. ROM normal. No CVA tenderness. Resp: clear to auscultation bilaterally better air movement today- exp wheezes Cardio: regular rate and rhythm, S1, S2 normal, no murmur, click, rub or gallop GI: soft, non-tender; bowel sounds normal; no masses,  no organomegaly Extremities: extremities normal, atraumatic, no cyanosis or edema Pulses: 2+ and symmetric Skin: Skin color, texture, turgor normal. No rashes or lesions Neurologic: Grossly normal. Pain inhibition weakness. Cognitively  intact. CN normal Incision/Wound: wounds clean and intact/staples out Mood: remains a little anxious at times.   Assessment/Plan: 1. Functional deficits secondary to left intertrochanteric hip fx which require 3+ hours per day of interdisciplinary therapy in a comprehensive inpatient rehab setting. Physiatrist is providing close team supervision and 24 hour management of active medical problems listed below. Physiatrist and rehab team continue to assess barriers to discharge/monitor patient progress toward functional and medical goals. FIM: FIM - Bathing Bathing Steps Patient Completed: Chest;Right Arm;Left Arm;Abdomen;Front perineal area;Buttocks;Right upper leg;Left upper leg;Right lower leg (including foot);Left lower leg (including foot) Bathing: 5: Set-up assist to: Obtain items  FIM - Upper Body Dressing/Undressing Upper body dressing/undressing steps patient completed: Thread/unthread right sleeve of pullover shirt/dresss;Thread/unthread left sleeve of pullover shirt/dress;Put head through opening of pull over shirt/dress;Pull shirt over trunk;Thread/unthread right sleeve of front closure shirt/dress;Thread/unthread left sleeve of front closure shirt/dress;Pull shirt around back of front closure shirt/dress;Button/unbutton shirt Upper body dressing/undressing: 5: Set-up assist to: Obtain clothing/put away FIM - Lower Body Dressing/Undressing Lower body dressing/undressing steps patient completed: Thread/unthread right underwear leg;Thread/unthread left underwear leg;Pull underwear up/down;Thread/unthread right pants leg;Thread/unthread left pants leg;Pull pants up/down;Don/Doff right shoe;Don/Doff left shoe Lower body dressing/undressing: 5: Set-up assist to: Obtain clothing  FIM - Toileting Toileting steps completed by patient: Adjust clothing after toileting;Adjust clothing prior to toileting;Performs perineal hygiene Toileting Assistive Devices: Grab bar or rail for  support Toileting: 6: Assistive device: No helper  FIM - Diplomatic Services operational officer Devices: Building control surveyor Transfers: 5-To toilet/BSC: Supervision (verbal cues/safety issues);5-From toilet/BSC: Supervision (verbal cues/safety issues)  FIM - Banker Devices: Therapist, occupational: 6: Supine > Sit: No assist;6: Sit > Supine: No assist;5: Bed > Chair or W/C: Supervision (verbal cues/safety issues);5: Chair or W/C >  Bed: Supervision (verbal cues/safety issues)  FIM - Locomotion: Wheelchair Locomotion: Wheelchair: 5: Travels 150 ft or more: maneuvers on rugs and over door sills with supervision, cueing or coaxing FIM - Locomotion: Ambulation Locomotion: Ambulation Assistive Devices: Designer, industrial/product Ambulation/Gait Assistance: 5: Supervision Locomotion: Ambulation: 2: Travels 50 - 149 ft with supervision/safety issues  Comprehension Comprehension Mode: Auditory Comprehension: 5-Understands complex 90% of the time/Cues < 10% of the time  Expression Expression Mode: Verbal Expression: 5-Expresses complex 90% of the time/cues < 10% of the time  Social Interaction Social Interaction: 6-Interacts appropriately with others with medication or extra time (anti-anxiety, antidepressant).  Problem Solving Problem Solving: 5-Solves complex 90% of the time/cues < 10% of the time  Memory Memory: 5-Recognizes or recalls 90% of the time/requires cueing < 10% of the time  1. left displaced intertrochanteric hip fracture. Status post closed reduction and internal fixation with intramedullary nail 09-25-11. Weightbearing as tolerated   -removed staples 2. DVT Prophylaxis/Anticoagulation: Subcutaneous Lovenox. Monitor platelet counts any signs of bleeding  3. Pain Management: Tramadol 50 mg every 6 hours as needed and Robaxin as needed. Monitor mental status   -added voltaren gel for OA in hands-with good effect 4.  postoperative anemia. Patient has been transfused. Hemoglobin & Hematocrit     Component Value Date/Time   HGB 9.4* 10/09/2011 1300   HCT 28.3* 10/09/2011 1300     5. Hypertension Norvasc 5 mg daily, Lotensin 20 mg daily, HCTZ 12.5 mg daily, Lopressor 100 mg twice daily. Good control presently 6. Hypothyroidism. Synthroid  7. Hypokalemia. Need to further replace. Sodium better 8. Klebsiella urinary tract infection. rx'ed  -pvr's appear low so far. 9. Hypomagnesia follow labs after transfusion 10.Pulmonary- encourage IS  -recent cxr shows further improvement  -oxygen- dc   -discuss resuming lasix with her pcp once home and when PO intake is increased. Hold potassium at discharge also.  LOS (Days) 15 A FACE TO FACE EVALUATION WAS PERFORMED  Misty Shah 10/13/2011, 8:31 AM

## 2011-10-17 ENCOUNTER — Telehealth: Payer: Self-pay

## 2011-10-17 NOTE — Telephone Encounter (Signed)
Irving Burton with [facility] called to get home occupational therapy order.

## 2011-10-18 ENCOUNTER — Telehealth: Payer: Self-pay | Admitting: Physical Medicine & Rehabilitation

## 2011-10-18 NOTE — Telephone Encounter (Signed)
Would like order for OT.

## 2011-10-18 NOTE — Telephone Encounter (Signed)
Gave verbal order to Emily.  

## 2011-10-18 NOTE — Telephone Encounter (Signed)
Verbal order given  

## 2011-10-27 ENCOUNTER — Telehealth: Payer: Self-pay

## 2011-10-27 NOTE — Telephone Encounter (Signed)
Irving Burton with [facility] needs verbal order to continue personal care.

## 2011-10-27 NOTE — Telephone Encounter (Signed)
Verbal given to continue care. 

## 2011-11-01 ENCOUNTER — Telehealth: Payer: Self-pay | Admitting: Physical Medicine & Rehabilitation

## 2011-11-01 DIAGNOSIS — M6281 Muscle weakness (generalized): Secondary | ICD-10-CM

## 2011-11-01 DIAGNOSIS — Z0271 Encounter for disability determination: Secondary | ICD-10-CM

## 2011-11-01 DIAGNOSIS — I10 Essential (primary) hypertension: Secondary | ICD-10-CM

## 2011-11-01 DIAGNOSIS — R269 Unspecified abnormalities of gait and mobility: Secondary | ICD-10-CM

## 2011-11-01 NOTE — Telephone Encounter (Signed)
Misty Shah [facility] would like to ext PT 2 more weeks.  [phone]

## 2011-11-02 NOTE — Telephone Encounter (Signed)
Tried calling Irving Burton to give verbal order but the # she left is connected.

## 2011-11-03 ENCOUNTER — Telehealth: Payer: Self-pay | Admitting: *Deleted

## 2011-11-03 NOTE — Telephone Encounter (Signed)
Ext on [facility] PT.  Verbal order given to [facility].

## 2011-11-06 ENCOUNTER — Telehealth: Payer: Self-pay

## 2011-11-06 NOTE — Telephone Encounter (Signed)
Verbal given to Irving Burton to continue care

## 2011-11-06 NOTE — Telephone Encounter (Signed)
Misty Shah needs orders for additional therapy.

## 2011-12-28 ENCOUNTER — Ambulatory Visit
Admission: RE | Admit: 2011-12-28 | Discharge: 2011-12-28 | Disposition: A | Discharge status: Home / Self Care | Payer: Medicare Other | Source: Ambulatory Visit | Attending: Endocrinology | Admitting: Endocrinology

## 2011-12-28 ENCOUNTER — Other Ambulatory Visit: Payer: Self-pay | Admitting: Endocrinology

## 2011-12-28 DIAGNOSIS — R52 Pain, unspecified: Secondary | ICD-10-CM

## 2011-12-28 DIAGNOSIS — R609 Edema, unspecified: Secondary | ICD-10-CM

## 2012-09-15 ENCOUNTER — Emergency Department (HOSPITAL_COMMUNITY)
Admission: EM | Admit: 2012-09-15 | Discharge: 2012-09-15 | Disposition: A | Discharge status: Home / Self Care | Payer: Medicare Other | Attending: Emergency Medicine | Admitting: Emergency Medicine

## 2012-09-15 ENCOUNTER — Encounter (HOSPITAL_COMMUNITY): Payer: Self-pay | Admitting: Cardiology

## 2012-09-15 ENCOUNTER — Emergency Department (HOSPITAL_COMMUNITY): Payer: Medicare Other

## 2012-09-15 DIAGNOSIS — Y92009 Unspecified place in unspecified non-institutional (private) residence as the place of occurrence of the external cause: Secondary | ICD-10-CM | POA: Insufficient documentation

## 2012-09-15 DIAGNOSIS — S5292XA Unspecified fracture of left forearm, initial encounter for closed fracture: Secondary | ICD-10-CM

## 2012-09-15 DIAGNOSIS — W19XXXA Unspecified fall, initial encounter: Secondary | ICD-10-CM

## 2012-09-15 DIAGNOSIS — Z87828 Personal history of other (healed) physical injury and trauma: Secondary | ICD-10-CM | POA: Insufficient documentation

## 2012-09-15 DIAGNOSIS — W010XXA Fall on same level from slipping, tripping and stumbling without subsequent striking against object, initial encounter: Secondary | ICD-10-CM | POA: Insufficient documentation

## 2012-09-15 DIAGNOSIS — S52599A Other fractures of lower end of unspecified radius, initial encounter for closed fracture: Secondary | ICD-10-CM | POA: Insufficient documentation

## 2012-09-15 DIAGNOSIS — S1093XA Contusion of unspecified part of neck, initial encounter: Secondary | ICD-10-CM | POA: Insufficient documentation

## 2012-09-15 DIAGNOSIS — W1789XA Other fall from one level to another, initial encounter: Secondary | ICD-10-CM | POA: Insufficient documentation

## 2012-09-15 DIAGNOSIS — M129 Arthropathy, unspecified: Secondary | ICD-10-CM | POA: Insufficient documentation

## 2012-09-15 DIAGNOSIS — Z862 Personal history of diseases of the blood and blood-forming organs and certain disorders involving the immune mechanism: Secondary | ICD-10-CM | POA: Insufficient documentation

## 2012-09-15 DIAGNOSIS — S0003XA Contusion of scalp, initial encounter: Secondary | ICD-10-CM | POA: Insufficient documentation

## 2012-09-15 DIAGNOSIS — Y9389 Activity, other specified: Secondary | ICD-10-CM | POA: Insufficient documentation

## 2012-09-15 DIAGNOSIS — Z7982 Long term (current) use of aspirin: Secondary | ICD-10-CM | POA: Insufficient documentation

## 2012-09-15 DIAGNOSIS — I10 Essential (primary) hypertension: Secondary | ICD-10-CM | POA: Insufficient documentation

## 2012-09-15 DIAGNOSIS — Z87440 Personal history of urinary (tract) infections: Secondary | ICD-10-CM | POA: Insufficient documentation

## 2012-09-15 DIAGNOSIS — Z8669 Personal history of other diseases of the nervous system and sense organs: Secondary | ICD-10-CM | POA: Insufficient documentation

## 2012-09-15 DIAGNOSIS — S0093XA Contusion of unspecified part of head, initial encounter: Secondary | ICD-10-CM

## 2012-09-15 DIAGNOSIS — Z79899 Other long term (current) drug therapy: Secondary | ICD-10-CM | POA: Insufficient documentation

## 2012-09-15 DIAGNOSIS — Z8659 Personal history of other mental and behavioral disorders: Secondary | ICD-10-CM | POA: Insufficient documentation

## 2012-09-15 DIAGNOSIS — Z8619 Personal history of other infectious and parasitic diseases: Secondary | ICD-10-CM | POA: Insufficient documentation

## 2012-09-15 DIAGNOSIS — Z8639 Personal history of other endocrine, nutritional and metabolic disease: Secondary | ICD-10-CM | POA: Insufficient documentation

## 2012-09-15 MED ORDER — HYDROCODONE-ACETAMINOPHEN 5-325 MG PO TABS: ORAL_TABLET | ORAL | Status: DC

## 2012-09-15 MED ORDER — HYDROCODONE-ACETAMINOPHEN 5-325 MG PO TABS
1.0000 | ORAL_TABLET | Freq: Once | ORAL | Status: AC
  Administered 2012-09-15: 1 via ORAL
  Filled 2012-09-15: qty 1

## 2012-09-15 NOTE — ED Notes (Signed)
Patient transported to X-ray 

## 2012-09-15 NOTE — ED Notes (Signed)
Ortho tech paged  

## 2012-09-15 NOTE — Progress Notes (Signed)
Orthopedic Tech Progress Note Patient Details:  Misty Shah 11-14-1915 [id number]  Ortho Devices Type of Ortho Device: Ace wrap;Arm sling;Sugartong splint Ortho Device/Splint Location: LUE Ortho Device/Splint Interventions: Ordered;Application   Jennye Moccasin 09/15/2012, 5:20 PM

## 2012-09-15 NOTE — ED Notes (Signed)
Pt reports she stepped backwards and tripped over her walker. Deformity and swelling noted to the left wrist. States she hit her head on the walker when she fell. Denies any LOC.

## 2012-09-15 NOTE — ED Provider Notes (Addendum)
History     CSN: [id number]  Arrival date & time 09/15/12  1544   First MD Initiated Contact with Patient 09/15/12 1606      Chief Complaint  Patient presents with  . Fall  . Wrist Pain    (Consider location/radiation/quality/duration/timing/severity/associated sxs/prior treatment) HPI  Pt relates she had gone to church today and had Easter lunch with her family. She was home in her living room and when she stood up she fell backwards and tripped over her walker. She fell about 1300 and had trouble getting up. States she hit the back of her head but has no LOC. Pt has pain and deformity of her left wrist. She took a valium about 2 pm.   Pt reports she sees Dr Madelon Lips, orthopedist, and had hip surgery by Dr Cleophas Dunker last year.   PCP Dr Juleen China  Past Medical History  Diagnosis Date  . Hypertension   . Arthritis   . Hypothyroidism   . UTI (lower urinary tract infection)   . Anxiety   . Claustrophobia   . Dislocated shoulder     hx bilateral shoulders - had therapy on them  . Shingles     Past Surgical History  Procedure Laterality Date  . Cholecystectomy    . Thyroidectomy    . Tonsillectomy    . Appendectomy    . Hip surgery      right hip  . Colonoscopy    . Cataract extraction, bilateral    . Femur im nail  09/25/2011    Procedure: INTRAMEDULLARY (IM) NAIL FEMORAL;  Surgeon: Valeria Batman, MD;  Location: [facility]S;  Service: Orthopedics;  Laterality: Left;  left hip     History reviewed. No pertinent family history.  History  Substance Use Topics  . Smoking status: Never Smoker   . Smokeless tobacco: Never Used  . Alcohol Use: No  Lives at home Lives alone Uses a walker  OB History   Grav Para Term Preterm Abortions TAB SAB Ect Mult Living                  Review of Systems  All other systems reviewed and are negative.    Allergies  Codeine; Epinephrine; Sulfur; and Tetanus toxoids novacaine   Home Medications   Current Outpatient Rx   Name  Route  Sig  Dispense  Refill  . amLODipine (NORVASC) 5 MG tablet   Oral   Take 5 mg by mouth daily.         Marland Kitchen aspirin EC 81 MG tablet   Oral   Take 81 mg by mouth daily.         . diclofenac sodium (VOLTAREN) 1 % GEL   Topical   Apply 1 application topically 4 (four) times daily.   1 Tube   0   . levothyroxine (SYNTHROID, LEVOTHROID) 100 MCG tablet   Oral   Take 100 mcg by mouth daily.         . metoprolol (LOPRESSOR) 100 MG tablet   Oral   Take 100 mg by mouth 2 (two) times daily.         . traMADol (ULTRAM) 50 MG tablet   Oral   Take 50 mg by mouth every 6 (six) hours as needed. For pain relief           BP 159/82  Pulse 70  Temp(Src) 97.6 F (36.4 C) (Oral)  Resp 16  SpO2 98%  Vital signs normal  Physical Exam  Nursing note and vitals reviewed. Constitutional: She is oriented to person, place, and time.  Non-toxic appearance. She does not appear ill. No distress.  Frail elderly female  HENT:  Head: Normocephalic and atraumatic.  Right Ear: External ear normal.  Left Ear: External ear normal.  Nose: Nose normal. No mucosal edema or rhinorrhea.  Mouth/Throat: Oropharynx is clear and moist and mucous membranes are normal. No dental abscesses or edematous.  No bruising, swelling or tenderness to her scalp/head  Eyes: Conjunctivae and EOM are normal. Pupils are equal, round, and reactive to light.  Neck: Normal range of motion and full passive range of motion without pain. Neck supple.  nontender cervical spine  Cardiovascular: Normal rate, regular rhythm and normal heart sounds.  Exam reveals no gallop and no friction rub.   No murmur heard. Pulmonary/Chest: Effort normal and breath sounds normal. No respiratory distress. She has no wheezes. She has no rhonchi. She has no rales. She exhibits no tenderness and no crepitus.  Abdominal: Soft. Normal appearance and bowel sounds are normal. She exhibits no distension. There is no tenderness. There  is no rebound and no guarding.  Musculoskeletal: Normal range of motion. She exhibits edema and tenderness.  Moves all extremities well.  Has deformity of her left wrist with swelling, no wounds. Has intact sensation and pulses  Neurological: She is alert and oriented to person, place, and time. She has normal strength. No cranial nerve deficit.  Skin: Skin is warm, dry and intact. No rash noted. No erythema. No pallor.  Psychiatric: She has a normal mood and affect. Her speech is normal and behavior is normal. Her mood appears not anxious.    ED Course  Procedures (including critical care time)  Medications  HYDROcodone-acetaminophen (NORCO/VICODIN) 5-325 MG per tablet 1 tablet (1 tablet Oral Given 09/15/12 1623)   Son did not feel CT of head indicated right now, he state she is her usual self.   Pt placed in a sugar tong splint and sling. Discussed possible surgery for definitive fracture care and to f/u tomorrow with her orthopedist.    Dg Wrist Complete Left  09/15/2012  *RADIOLOGY REPORT*  Clinical Data: Fall, left wrist pain.  LEFT WRIST - COMPLETE 3+ VIEW  Comparison: None.  Findings: There is a mildly impacted and angulated fracture through the distal left radius.  Ulnar styloid fracture noted, nondisplaced.  Diffuse osteopenia and soft tissue swelling.  IMPRESSION: Mildly angulated and impacted distal left radial fracture.  Ulnar styloid fracture.  Previous field chondrocalcinosis noted. Vascular calcifications present.  Advanced degenerative changes of the first carpal metacarpal joint.   Original Report Authenticated By: Charlett Nose, M.D.      1. Radius fracture, left, closed, initial encounter   2. Fall, initial encounter   3. Contusion of head, initial encounter    New Prescriptions   HYDROCODONE-ACETAMINOPHEN (NORCO/VICODIN) 5-325 MG PER TABLET    Take 1 or 2 po Q 6hrs for pain    Plan discharge  Devoria Albe, MD, FACEP    MDM          Ward Givens,  MD 09/15/12 1724  Ward Givens, MD 09/15/12 1731

## 2013-02-07 IMAGING — CR DG CHEST 2V
3 series · 3 of 3 positions shown · non-contrast
Comparison: 10/03/2011.

CLINICAL DATA: infiltrate.  No chest pain.  Short of breath.

CHEST - 2 VIEW

[w chest lat (1 of 2)]
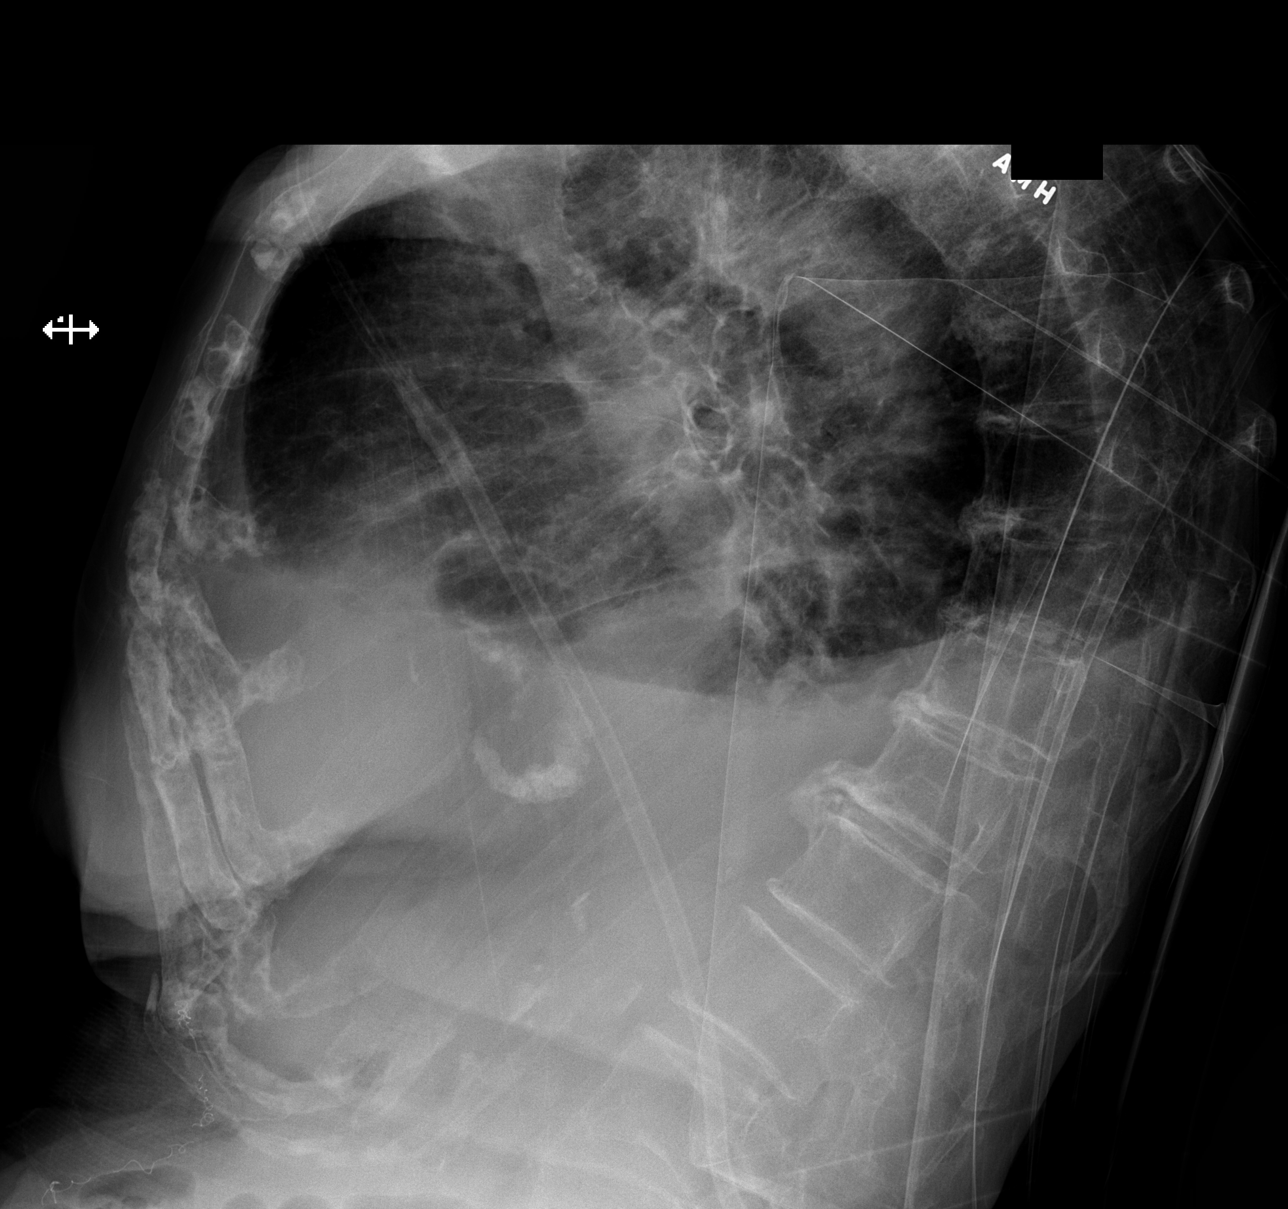

[w chest lat (2 of 2)]
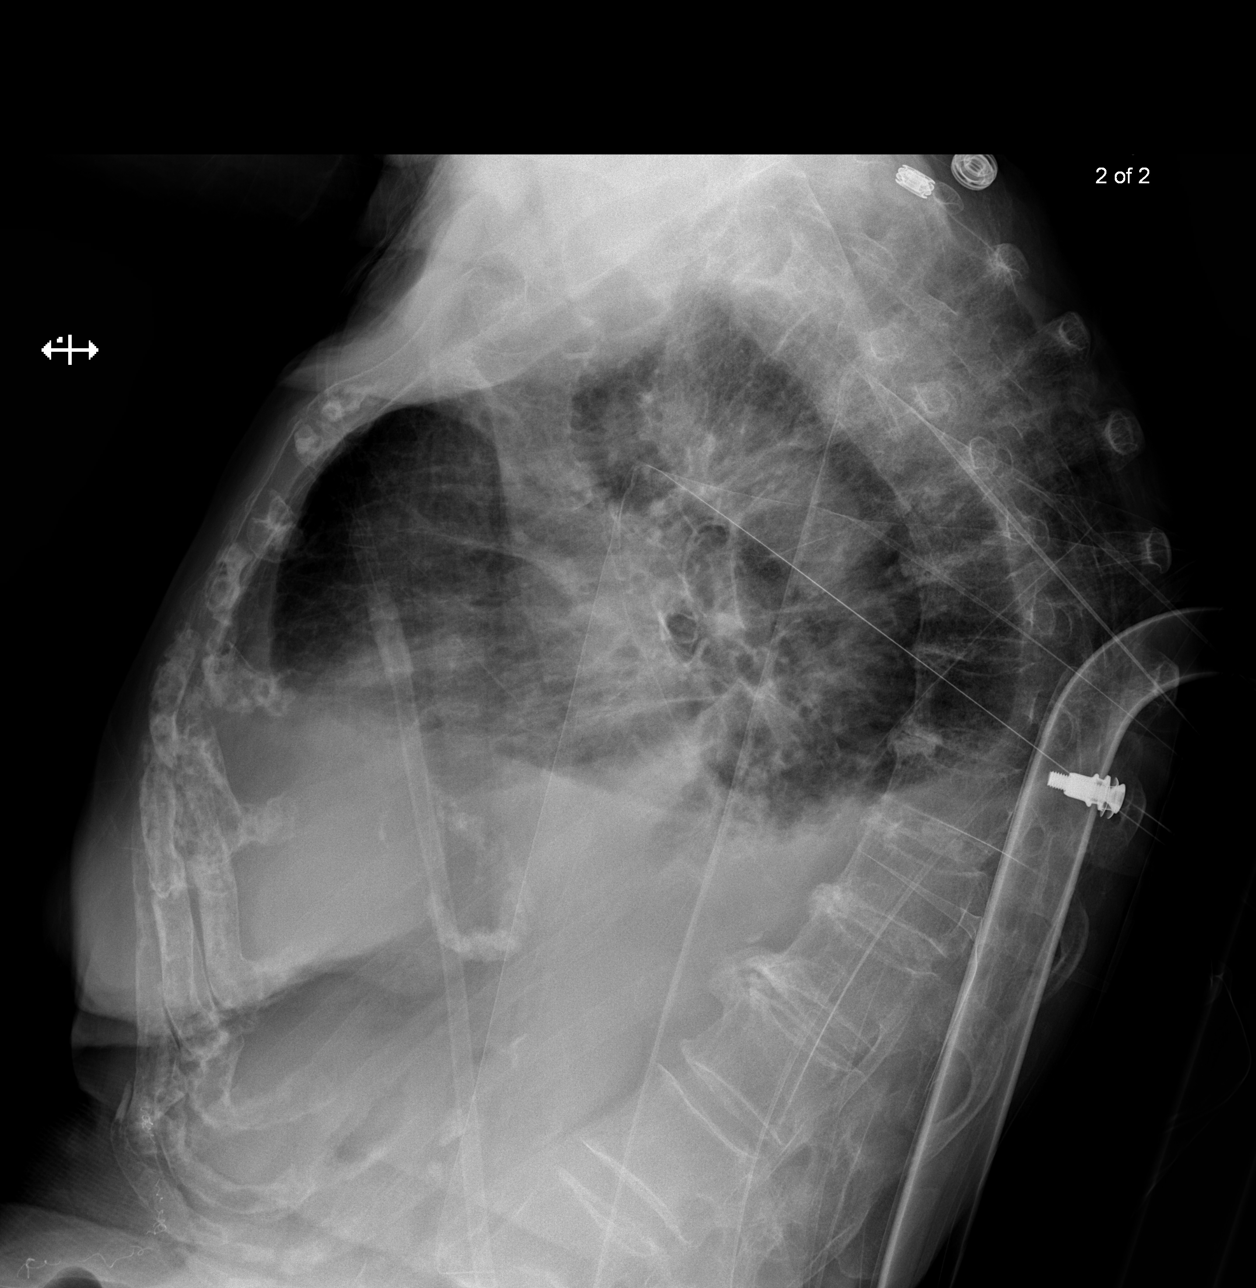

[x chest ap]
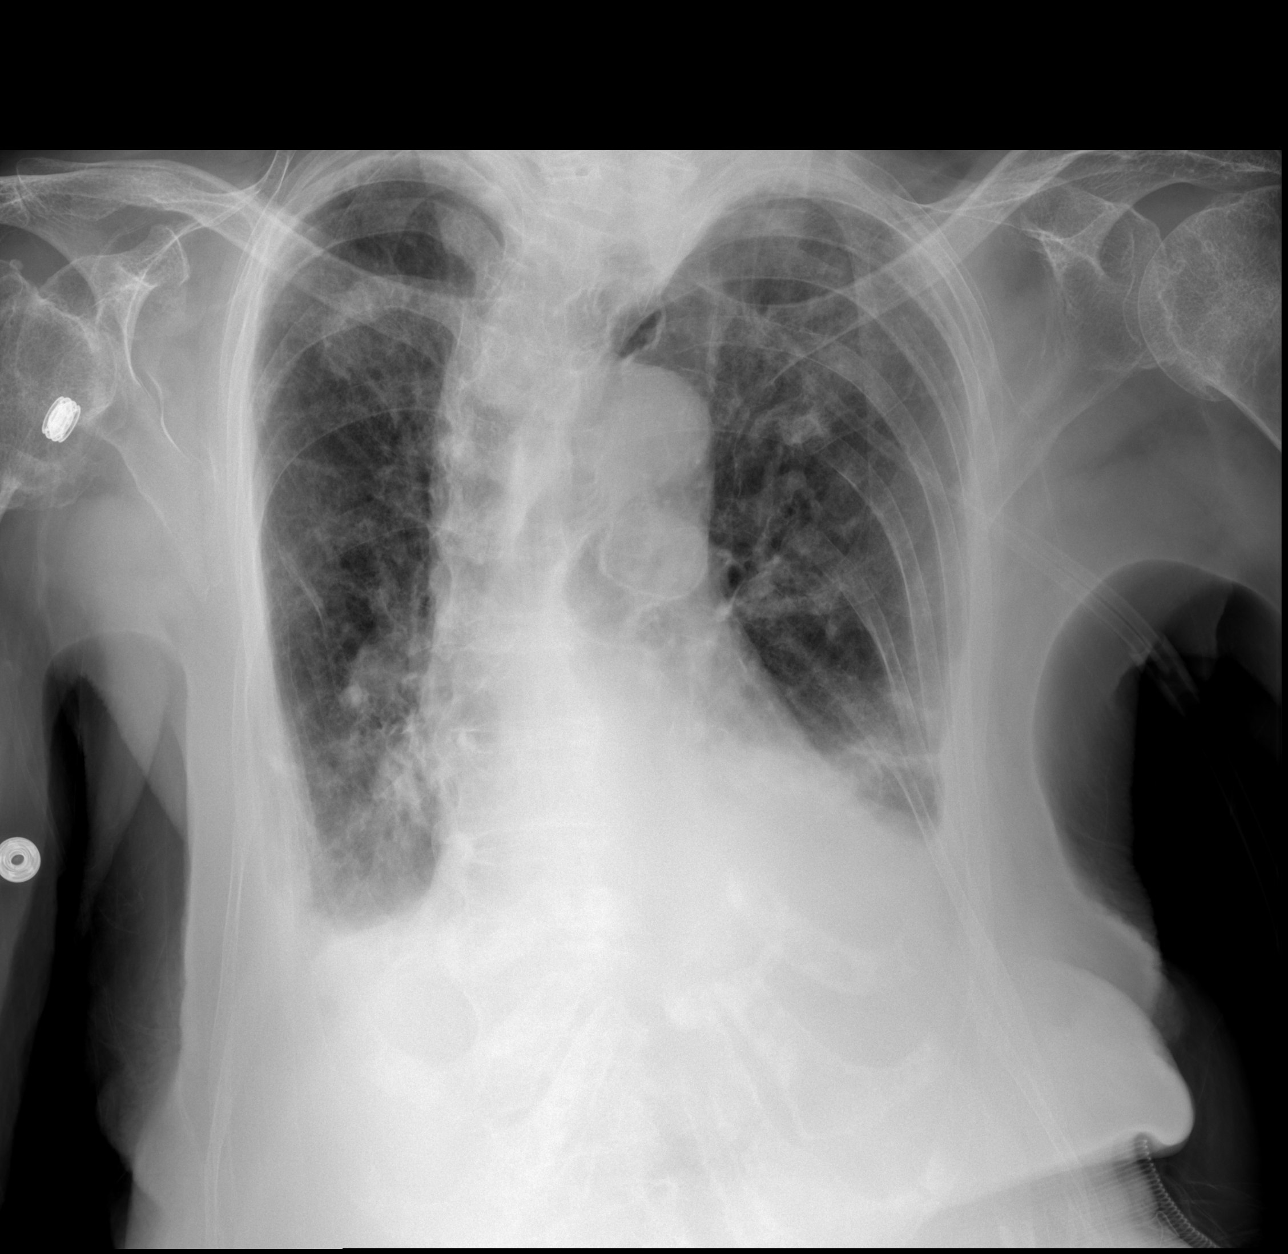

[3 of 3 positions shown; findings below may reference images not displayed]

FINDINGS: Compared to the prior exam, there is decreasing density
of the right upper lobe opacity, likely representing improving
pneumonia.  Bilateral pleural effusions appear more prominent which
may be positional.  Severe emphysema is present.  Thoracic
kyphosis.  Cardiomegaly.  Tortuous thoracic aorta appears similar.
Deformity of the proximal right humerus compatible with healed
fracture.

Radiographic follow-up is recommended to ensure clearing and
exclude an underlying lesion.  Radiographic clearing is usually
observed at 4-6 weeks.
IMPRESSION: Improving right apical density, likely representing treated
pneumonia.  Continued follow-up to ensure clearing is recommended.
Likely positional shift in the bilateral pleural effusions.

## 2013-03-24 ENCOUNTER — Encounter: Payer: Self-pay | Admitting: Podiatry

## 2013-03-24 ENCOUNTER — Ambulatory Visit (INDEPENDENT_AMBULATORY_CARE_PROVIDER_SITE_OTHER): Payer: Medicare Other | Admitting: Podiatry

## 2013-03-24 VITALS — BP 155/90 | HR 62 | Resp 12 | Ht 60.0 in | Wt 110.0 lb

## 2013-03-24 DIAGNOSIS — M79609 Pain in unspecified limb: Secondary | ICD-10-CM

## 2013-03-24 DIAGNOSIS — B351 Tinea unguium: Secondary | ICD-10-CM

## 2013-03-25 NOTE — Progress Notes (Signed)
Subjective:     Patient ID: Misty Shah, female   DOB: 1915-08-25, 79 y.o.   MRN: [id number]  HPI patient is found to have nail disease 1-5 bilateral with thickness and pain when pressed. Presents with caregiver  Review of Systems     Objective:   Physical Exam  Constitutional: She appears well-nourished.  Musculoskeletal: Normal range of motion.  Skin: Skin is warm.   Diminished pulses noted bilateral and nailed thickness with discomfort 1-5 bilateral was noted    Assessment:     Mycotic nail infection with pain 1-5 bilateral    Plan:     Debridement of nailbeds 1-5 bilateral no iatrogenic bleeding noted

## 2013-05-01 IMAGING — US US EXTREM LOW VENOUS*R*
1 series · 14 of 24 positions shown · non-contrast
Comparison: None.

CLINICAL DATA: Right lower extremity pain and edema.

RIGHT LOWER EXTREMITY VENOUS DUPLEX ULTRASOUND
TECHNIQUE: Gray-scale sonography with graded compression, as well
as color Doppler and duplex ultrasound were performed to evaluate
the deep venous system of the lower extremity from the level of the
common femoral vein through the popliteal and proximal calf veins.
Spectral Doppler was utilized to evaluate flow at rest and with
distal augmentation maneuvers.

[Series 1: us extrem low venous*right* · 14 of 32 slices shown]
[im 1/32]
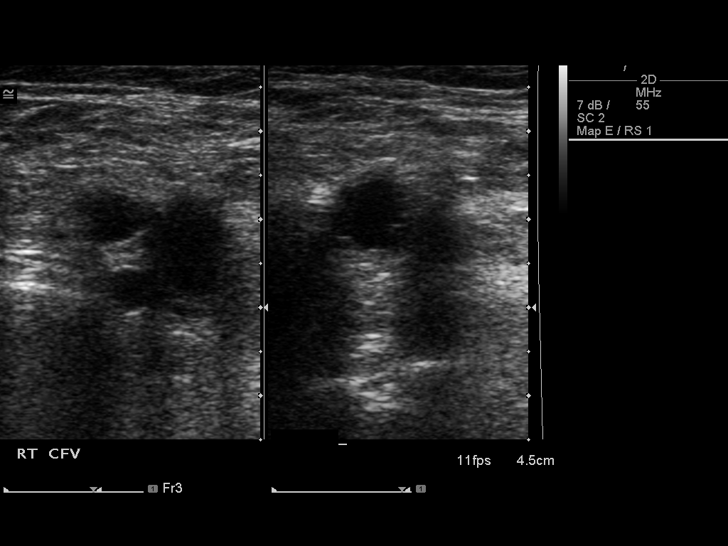
[im 3/32]
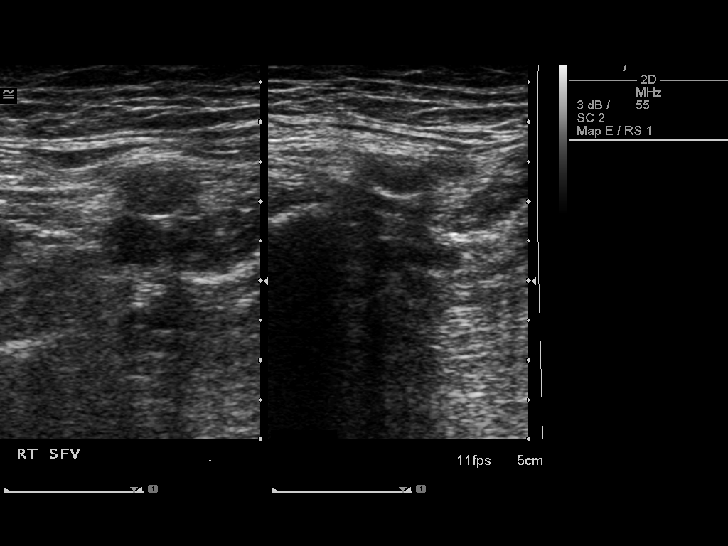
[im 6/32]
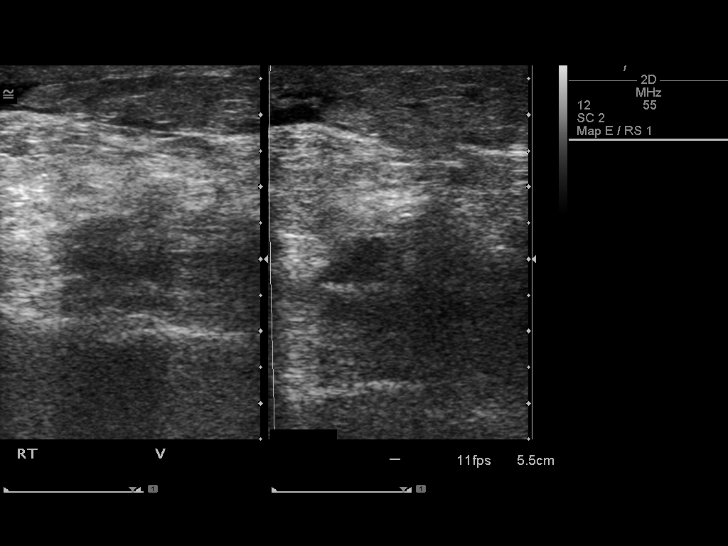
[im 9/32]
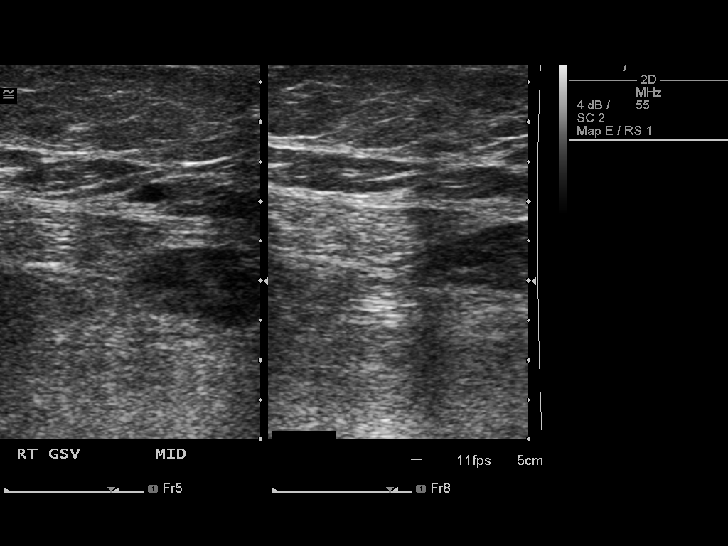
[im 10/32]
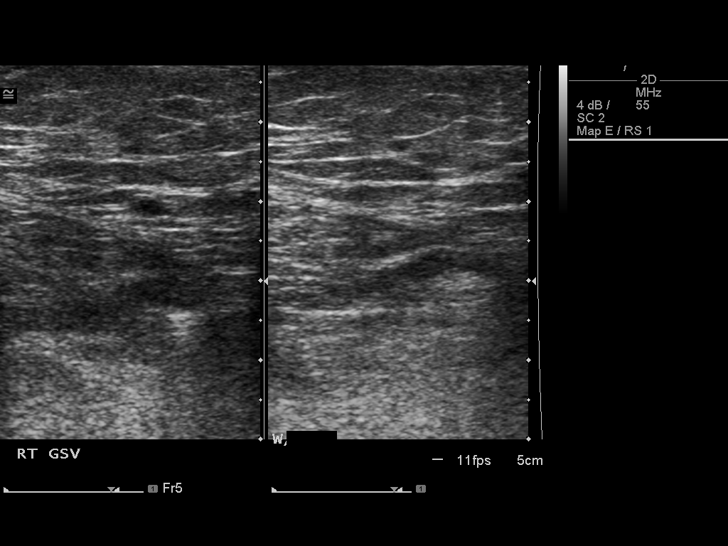
[im 13/32]
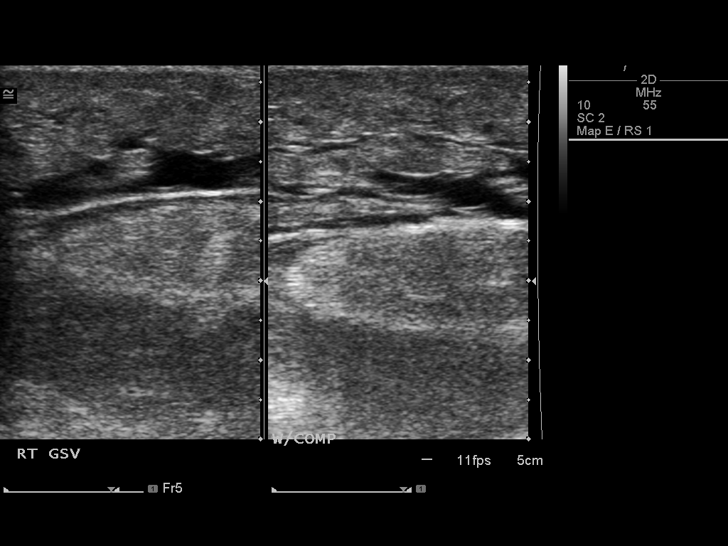
[im 15/32]
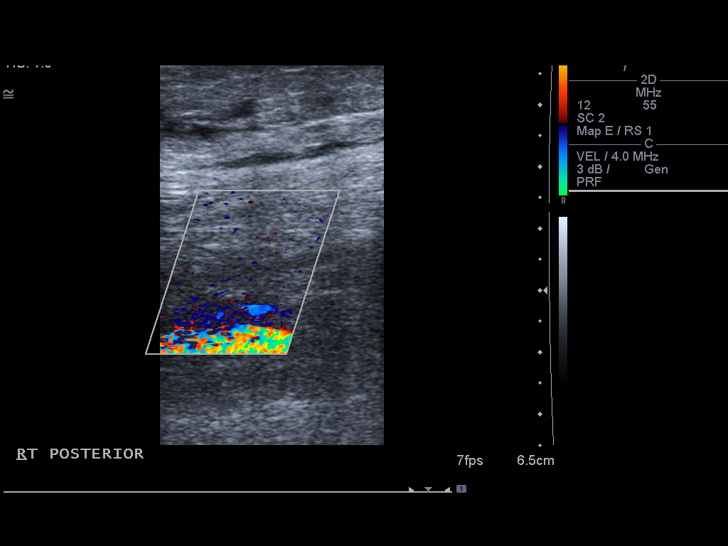
[im 17/32]
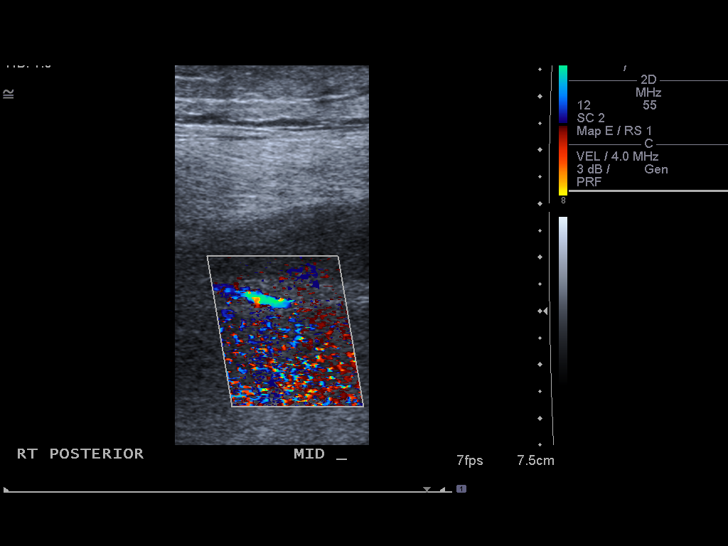
[im 19/32]
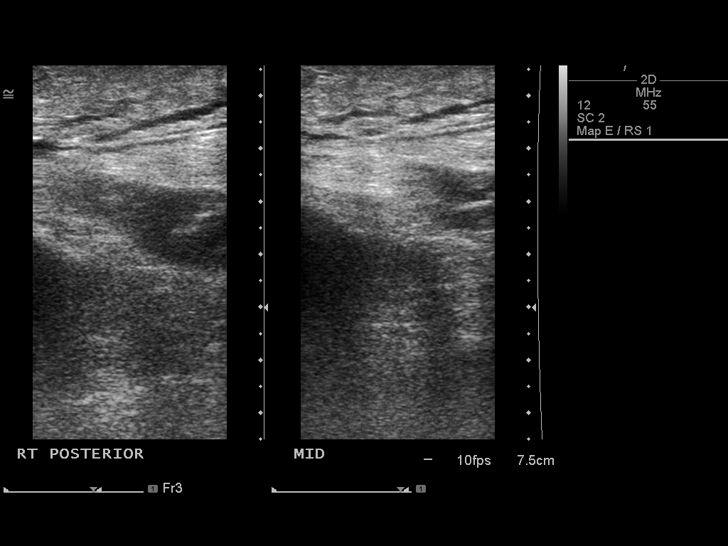
[im 22/32]
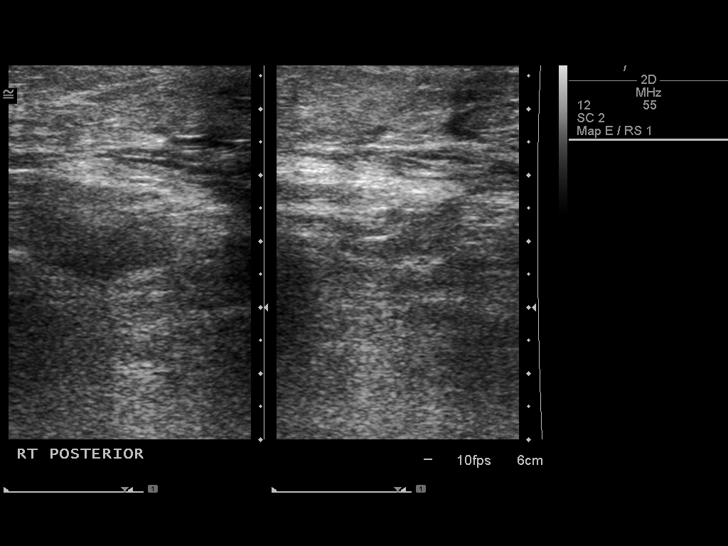
[im 25/32]
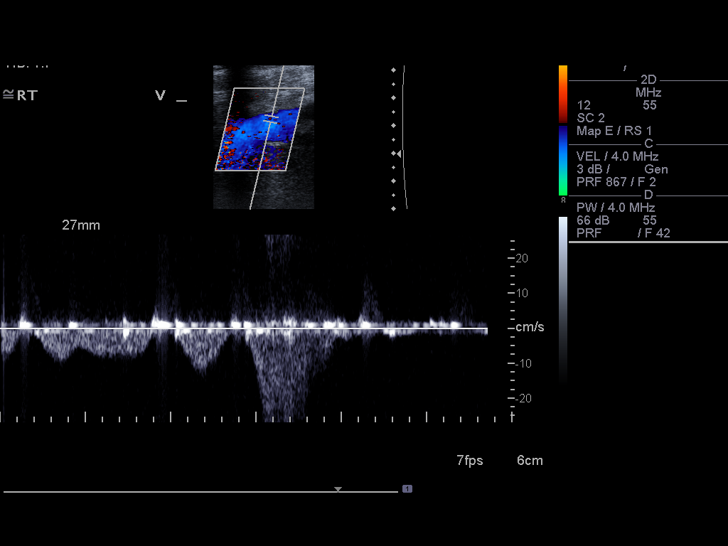
[im 26/32]
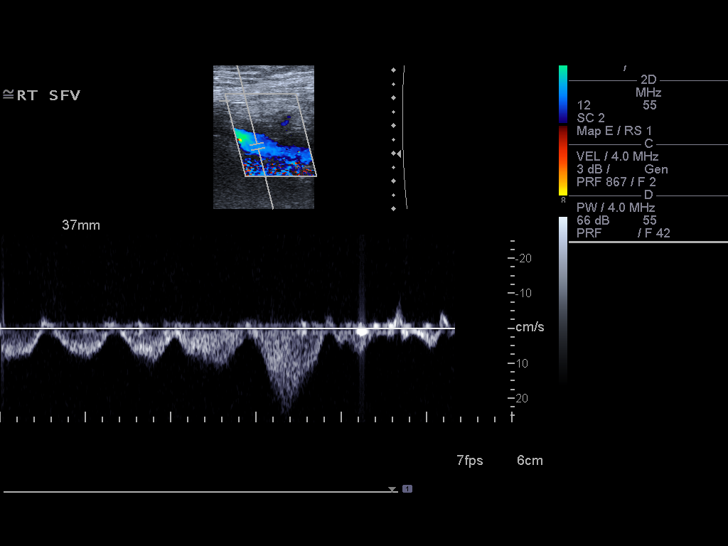
[im 29/32]
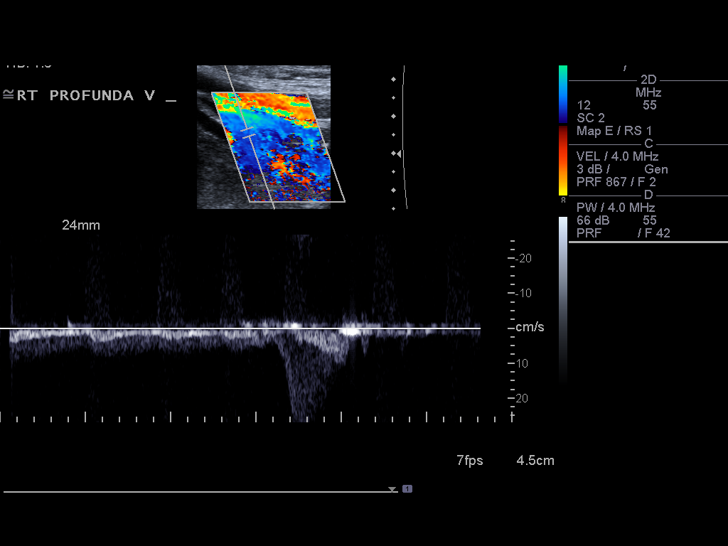
[im 32/32]
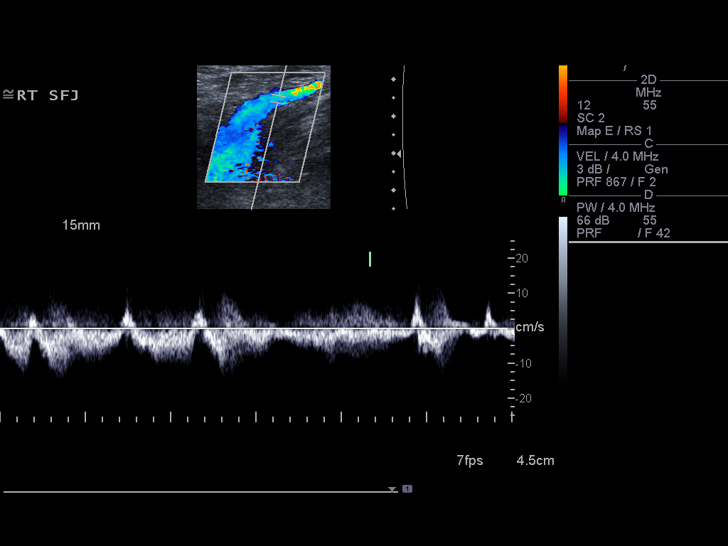

[14 of 24 positions shown; findings below may reference images not displayed]

FINDINGS: Normal compressibility of the common femoral,
superficial femoral, and popliteal veins is demonstrated, as well
as the visualized proximal calf veins.  No filling defects to
suggest DVT on grayscale or color Doppler imaging.  Doppler
waveforms show normal direction of venous flow, normal respiratory
phasicity and response to augmentation.

Subcutaneous edema present in the calf.  No visualized
varicosities.
IMPRESSION: No evidence of right lower extremity deep vein thrombosis.

## 2013-06-23 ENCOUNTER — Ambulatory Visit: Payer: PRIVATE HEALTH INSURANCE | Admitting: Podiatry

## 2013-06-24 ENCOUNTER — Ambulatory Visit (INDEPENDENT_AMBULATORY_CARE_PROVIDER_SITE_OTHER): Payer: Medicare Other

## 2013-06-24 VITALS — BP 177/90 | HR 64 | Resp 16

## 2013-06-24 DIAGNOSIS — M79609 Pain in unspecified limb: Secondary | ICD-10-CM

## 2013-06-24 DIAGNOSIS — B351 Tinea unguium: Secondary | ICD-10-CM

## 2013-06-24 NOTE — Progress Notes (Signed)
   Subjective:    Patient ID: Deborha PaymentVera M Donley, female    DOB: Jul 03, 1915, 80 y.o.   MRN: [id number]  HPI Comments: "Trim the toenails and the calluses on my big toes"     Review of Systems no new changes or findings on review of     Objective:   Physical Exam Vascular status is intact as follows DP plus one over 4 bilateral PT +2/4 bilateral mild varicosities noted both feet. Neurologically epicritic and proprioceptive sensations are intact and symmetric bilateral. There is normal plantar response DTRs not listed. Dermatologically skin color pigment normal hair growth absent nails somewhat thickened and brittle friable discolored 1 through 5 bilateral tender both on palpation and with enclosed shoe wear and ambulation as well. There are no open wounds or ulcerations noted with P. biomechanical exam unremarkable for rectus foot with slight bunion deformity right more so than left with hallux abductovalgus and bunion deformity noted. There is mild diffuse keratoses pinch callus first right otherwise unremarkable findings noted       Assessment & Plan:  Assessment this time is onychomycosis thick friable painful discolored brittle nails 1 through 5 bilateral are debrided at this time return for future palliative care and as-needed basis. Maintain appropriate accommodative shoe gear all times avoid entire constrictive especially over the bunion area. Reappointed 3 months for palliative care next  Alvan Dameichard Cornisha Zetino DPM

## 2013-06-24 NOTE — Patient Instructions (Signed)

## 2013-09-22 ENCOUNTER — Ambulatory Visit (INDEPENDENT_AMBULATORY_CARE_PROVIDER_SITE_OTHER): Payer: Medicare Other | Admitting: Podiatry

## 2013-09-22 ENCOUNTER — Encounter: Payer: Self-pay | Admitting: Podiatry

## 2013-09-22 VITALS — BP 168/90 | HR 63 | Resp 16

## 2013-09-22 DIAGNOSIS — B351 Tinea unguium: Secondary | ICD-10-CM

## 2013-09-22 DIAGNOSIS — M79609 Pain in unspecified limb: Secondary | ICD-10-CM

## 2013-09-24 NOTE — Progress Notes (Signed)
Subjective:     Patient ID: Misty Shah, female   DOB: 30-Aug-1915, 80 y.o.   MRN: [id number]  HPI elderly female with nail disease thickness and yellow discoloration 1-5 both feet that are painful and she cannot cut   Review of Systems     Objective:   Physical Exam Neurovascular status unchanged with thick yellow nailbeds 1-5 both feet that are painful when pressed    Assessment:     Mycotic nail infection with pain 1-5 both feet    Plan:     Debridement painful nailbeds 1-5 both feet with no iatrogenic bleeding noted

## 2014-01-19 ENCOUNTER — Ambulatory Visit (INDEPENDENT_AMBULATORY_CARE_PROVIDER_SITE_OTHER): Payer: Medicare Other | Admitting: Podiatry

## 2014-01-19 ENCOUNTER — Encounter: Payer: Self-pay | Admitting: Podiatry

## 2014-01-19 DIAGNOSIS — B351 Tinea unguium: Secondary | ICD-10-CM

## 2014-01-19 DIAGNOSIS — M79609 Pain in unspecified limb: Secondary | ICD-10-CM

## 2014-01-19 DIAGNOSIS — M79673 Pain in unspecified foot: Secondary | ICD-10-CM

## 2014-01-20 NOTE — Progress Notes (Signed)
Subjective:     Patient ID: Misty Shah, female   DOB: 1916-04-27, 80 y.o.   MRN: [id number]  HPI patient presents with thick yellow nailbeds 1-5 both feet that are painful   Review of Systems     Objective:   Physical Exam Neurovascular status intact with thick yellow brittle nailbeds 1-5 both feet    Assessment:     Mycotic nail infection is with pain 1-5 both feet    Plan:     Debride painful nailbeds 1-5 both feet with no iatrogenic bleeding noted

## 2014-04-20 ENCOUNTER — Encounter: Payer: Self-pay | Admitting: Podiatry

## 2014-04-20 ENCOUNTER — Ambulatory Visit (INDEPENDENT_AMBULATORY_CARE_PROVIDER_SITE_OTHER): Payer: Medicare Other | Admitting: Podiatry

## 2014-04-20 DIAGNOSIS — M79673 Pain in unspecified foot: Secondary | ICD-10-CM

## 2014-04-20 DIAGNOSIS — B351 Tinea unguium: Secondary | ICD-10-CM

## 2014-04-20 NOTE — Progress Notes (Signed)
Subjective:     Patient ID: Misty Shah, female   DOB: July 25, 1915, 80 y.o.   MRN: [id number]  HPI patient presents with thick yellow nailbeds 1-5 both feet that are painful   Review of Systems     Objective:   Physical Exam Neurovascular status intact with thick yellow brittle nailbeds 1-5 both feet    Assessment:     Mycotic nail infection is with pain 1-5 both feet    Plan:     Debride painful nailbeds 1-5 both feet with no iatrogenic bleeding noted

## 2014-07-20 ENCOUNTER — Ambulatory Visit (INDEPENDENT_AMBULATORY_CARE_PROVIDER_SITE_OTHER): Payer: Medicare Other | Admitting: Podiatry

## 2014-07-20 DIAGNOSIS — B351 Tinea unguium: Secondary | ICD-10-CM

## 2014-07-20 DIAGNOSIS — M79673 Pain in unspecified foot: Secondary | ICD-10-CM

## 2014-07-21 NOTE — Progress Notes (Signed)
Subjective:     Patient ID: Misty Shah, female   DOB: 04-May-1916, 81 y.o.   MRN: [id number]  HPI patient presents with nail disease 1-5 both feet with thickness and debris and pain   Review of Systems     Objective:   Physical Exam Neurovascular status unchanged with thick yellow brittle nailbeds 1-5 both feet that are painful when pressed and hard for the patient to cut    Assessment:     Chronic mycotic nail infection with pain 1-5 both feet    Plan:     Debride painful nailbeds 1-5 both feet with no iatrogenic bleeding noted

## 2014-11-02 ENCOUNTER — Ambulatory Visit (INDEPENDENT_AMBULATORY_CARE_PROVIDER_SITE_OTHER): Payer: Medicare Other | Admitting: Podiatry

## 2014-11-02 ENCOUNTER — Encounter: Payer: Self-pay | Admitting: Podiatry

## 2014-11-02 VITALS — BP 181/90 | HR 74 | Resp 16

## 2014-11-02 DIAGNOSIS — M79673 Pain in unspecified foot: Secondary | ICD-10-CM

## 2014-11-02 DIAGNOSIS — B351 Tinea unguium: Secondary | ICD-10-CM

## 2014-11-04 NOTE — Progress Notes (Signed)
Subjective:     Patient ID: Misty Shah, female   DOB: 05-23-1916, 81 y.o.   MRN: [id number]  HPI patient is noted to have thick yellow brittle nailbeds 1-5 both feet that are bothersome   Review of Systems     Objective:   Physical Exam Neurovascular status unchanged with thick yellow brittle nailbeds 1-5 both feet with pain    Assessment:     Mycotic nail infections with pain 1-5 both feet    Plan:     Debride painful nailbeds 1-5 both feet with no iatrogenic bleeding noted

## 2015-02-09 ENCOUNTER — Ambulatory Visit (INDEPENDENT_AMBULATORY_CARE_PROVIDER_SITE_OTHER): Payer: Medicare Other | Admitting: Podiatry

## 2015-02-09 ENCOUNTER — Encounter: Payer: Self-pay | Admitting: Podiatry

## 2015-02-09 DIAGNOSIS — B351 Tinea unguium: Secondary | ICD-10-CM

## 2015-02-09 DIAGNOSIS — M79673 Pain in unspecified foot: Secondary | ICD-10-CM | POA: Diagnosis not present

## 2015-02-09 DIAGNOSIS — L84 Corns and callosities: Secondary | ICD-10-CM

## 2015-02-09 NOTE — Progress Notes (Signed)
Patient ID: Misty Shah, female   DOB: 1916-04-10, 81 y.o.   MRN: [id number] Complaint:  Visit Type: Patient returns to my office for continued preventative foot care services. Complaint: Patient states" my nails have grown long and thick and become painful to walk and wear shoes" Patient has been diagnosed with DM with no foot complications. The patient presents for preventative foot care services. No changes to ROS.  Painful callus second toe right foot.  Podiatric Exam: Vascular: dorsalis pedis and posterior tibial pulses are palpable bilateral. Capillary return is immediate. Temperature gradient is WNL. Skin turgor WNL  Sensorium: Normal Semmes Weinstein monofilament test. Normal tactile sensation bilaterally. Nail Exam: Pt has thick disfigured discolored nails with subungual debris noted bilateral entire nail hallux through fifth toenails Ulcer Exam: There is no evidence of ulcer or pre-ulcerative changes or infection. Orthopedic Exam: Muscle tone and strength are WNL. No limitations in general ROM. No crepitus or effusions noted. Foot type and digits show no abnormalities. Bony prominences are unremarkable. Skin: No Porokeratosis. No infection or ulcers.  Distal clavi second toe right foot.  Diagnosis:  Onychomycosis, , Pain in right toe, pain in left toes, Clavi right foot  Treatment & Plan Procedures and Treatment: Consent by patient was obtained for treatment procedures. The patient understood the discussion of treatment and procedures well. All questions were answered thoroughly reviewed. Debridement of mycotic and hypertrophic toenails, 1 through 5 bilateral and clearing of subungual debris. No ulceration, no infection noted.  Return Visit-Office Procedure: Patient instructed to return to the office for a follow up visit 3 months for continued evaluation and treatment.

## 2015-05-18 ENCOUNTER — Ambulatory Visit (INDEPENDENT_AMBULATORY_CARE_PROVIDER_SITE_OTHER): Payer: Medicare Other | Admitting: Podiatry

## 2015-05-18 ENCOUNTER — Encounter: Payer: Self-pay | Admitting: Podiatry

## 2015-05-18 DIAGNOSIS — B351 Tinea unguium: Secondary | ICD-10-CM | POA: Diagnosis not present

## 2015-05-18 DIAGNOSIS — M79673 Pain in unspecified foot: Secondary | ICD-10-CM

## 2015-05-18 NOTE — Progress Notes (Signed)
Patient ID: Misty Shah, female   DOB: 08-16-1915, 81 y.o.   MRN: [id number] Complaint:  Visit Type: Patient returns to my office for continued preventative foot care services. Complaint: Patient states" my nails have grown long and thick and become painful to walk and wear shoes" Patient has been diagnosed with DM with no foot complications. The patient presents for preventative foot care services. No changes to ROS.    Podiatric Exam: Vascular: dorsalis pedis and posterior tibial pulses are palpable bilateral. Capillary return is immediate. Temperature gradient is WNL. Skin turgor WNL  Sensorium: Normal Semmes Weinstein monofilament test. Normal tactile sensation bilaterally. Nail Exam: Pt has thick disfigured discolored nails with subungual debris noted bilateral entire nail hallux through fifth toenails Ulcer Exam: There is no evidence of ulcer or pre-ulcerative changes or infection. Orthopedic Exam: Muscle tone and strength are WNL. No limitations in general ROM. No crepitus or effusions noted. Foot type and digits show no abnormalities. Bony prominences are unremarkable. Skin: No Porokeratosis. No infection or ulcers.    Diagnosis:  Onychomycosis, , Pain in right toe, pain in left toe  Treatment & Plan Procedures and Treatment: Consent by patient was obtained for treatment procedures. The patient understood the discussion of treatment and procedures well. All questions were answered thoroughly reviewed. Debridement of mycotic and hypertrophic toenails, 1 through 5 bilateral and clearing of subungual debris. No ulceration, no infection noted.  Return Visit-Office Procedure: Patient instructed to return to the office for a follow up visit 3 months for continued evaluation and treatment.   Misty Shah DPM

## 2015-05-30 DIAGNOSIS — S32592A Other specified fracture of left pubis, initial encounter for closed fracture: Secondary | ICD-10-CM

## 2015-05-30 HISTORY — DX: Other specified fracture of left pubis, initial encounter for closed fracture: S32.592A

## 2015-06-01 ENCOUNTER — Emergency Department (HOSPITAL_COMMUNITY): Payer: Medicare Other

## 2015-06-01 ENCOUNTER — Encounter (HOSPITAL_COMMUNITY): Payer: Self-pay | Admitting: Neurology

## 2015-06-01 ENCOUNTER — Emergency Department (HOSPITAL_COMMUNITY)
Admission: EM | Admit: 2015-06-01 | Discharge: 2015-06-01 | Disposition: A | Discharge status: Home / Self Care | Payer: Medicare Other | Attending: Emergency Medicine | Admitting: Emergency Medicine

## 2015-06-01 DIAGNOSIS — Z87440 Personal history of urinary (tract) infections: Secondary | ICD-10-CM | POA: Insufficient documentation

## 2015-06-01 DIAGNOSIS — Z79899 Other long term (current) drug therapy: Secondary | ICD-10-CM | POA: Diagnosis not present

## 2015-06-01 DIAGNOSIS — Z8619 Personal history of other infectious and parasitic diseases: Secondary | ICD-10-CM | POA: Diagnosis not present

## 2015-06-01 DIAGNOSIS — R600 Localized edema: Secondary | ICD-10-CM | POA: Diagnosis not present

## 2015-06-01 DIAGNOSIS — F419 Anxiety disorder, unspecified: Secondary | ICD-10-CM | POA: Diagnosis not present

## 2015-06-01 DIAGNOSIS — M1990 Unspecified osteoarthritis, unspecified site: Secondary | ICD-10-CM | POA: Diagnosis not present

## 2015-06-01 DIAGNOSIS — Z87828 Personal history of other (healed) physical injury and trauma: Secondary | ICD-10-CM | POA: Insufficient documentation

## 2015-06-01 DIAGNOSIS — E039 Hypothyroidism, unspecified: Secondary | ICD-10-CM | POA: Diagnosis not present

## 2015-06-01 DIAGNOSIS — R0602 Shortness of breath: Secondary | ICD-10-CM

## 2015-06-01 DIAGNOSIS — Z7982 Long term (current) use of aspirin: Secondary | ICD-10-CM | POA: Insufficient documentation

## 2015-06-01 DIAGNOSIS — I5023 Acute on chronic systolic (congestive) heart failure: Secondary | ICD-10-CM | POA: Insufficient documentation

## 2015-06-01 DIAGNOSIS — I10 Essential (primary) hypertension: Secondary | ICD-10-CM | POA: Diagnosis not present

## 2015-06-01 DIAGNOSIS — I509 Heart failure, unspecified: Secondary | ICD-10-CM

## 2015-06-01 LAB — CBC
HEMATOCRIT: 39.9 % (ref 36.0–46.0)
HEMOGLOBIN: 12.8 g/dL (ref 12.0–15.0)
MCH: 29.2 pg (ref 26.0–34.0)
MCHC: 32.1 g/dL (ref 30.0–36.0)
MCV: 91.1 fL (ref 78.0–100.0)
Platelets: 214 10*3/uL (ref 150–400)
RBC: 4.38 MIL/uL (ref 3.87–5.11)
RDW: 13.4 % (ref 11.5–15.5)
WBC: 9.4 10*3/uL (ref 4.0–10.5)

## 2015-06-01 LAB — BASIC METABOLIC PANEL
ANION GAP: 8 (ref 5–15)
BUN: 15 mg/dL (ref 6–20)
CALCIUM: 9.5 mg/dL (ref 8.9–10.3)
CO2: 28 mmol/L (ref 22–32)
Chloride: 96 mmol/L — ABNORMAL LOW (ref 101–111)
Creatinine, Ser: 0.67 mg/dL (ref 0.44–1.00)
GLUCOSE: 131 mg/dL — AB (ref 65–99)
POTASSIUM: 4.3 mmol/L (ref 3.5–5.1)
SODIUM: 132 mmol/L — AB (ref 135–145)

## 2015-06-01 LAB — I-STAT CHEM 8, ED
BUN: 17 mg/dL (ref 6–20)
CALCIUM ION: 1.22 mmol/L (ref 1.13–1.30)
CREATININE: 0.7 mg/dL (ref 0.44–1.00)
Chloride: 95 mmol/L — ABNORMAL LOW (ref 101–111)
GLUCOSE: 123 mg/dL — AB (ref 65–99)
HCT: 44 % (ref 36.0–46.0)
HEMOGLOBIN: 15 g/dL (ref 12.0–15.0)
POTASSIUM: 4.3 mmol/L (ref 3.5–5.1)
Sodium: 134 mmol/L — ABNORMAL LOW (ref 135–145)
TCO2: 27 mmol/L (ref 0–100)

## 2015-06-01 LAB — I-STAT TROPONIN, ED: TROPONIN I, POC: 0.02 ng/mL (ref 0.00–0.08)

## 2015-06-01 NOTE — ED Notes (Signed)
MD made aware patient family wants to see him

## 2015-06-01 NOTE — Discharge Instructions (Signed)
Restart your fluid medication (Furosemide), and take 40 mg twice a day for 1 week, then once a day. Return here if needed for problems. See your doctor for a check up in one week, and as needed.  Heart Failure Heart failure means your heart has trouble pumping blood. This makes it hard for your body to work well. Heart failure is usually a long-term (chronic) condition. You must take good care of yourself and follow your doctor's treatment plan. HOME CARE  Take your heart medicine as told by your doctor.  Do not stop taking medicine unless your doctor tells you to.  Do not skip any dose of medicine.  Refill your medicines before they run out.  Take other medicines only as told by your doctor or pharmacist.  Stay active if told by your doctor. The elderly and people with severe heart failure should talk with a doctor about physical activity.  Eat heart-healthy foods. Choose foods that are without trans fat and are low in saturated fat, cholesterol, and salt (sodium). This includes fresh or frozen fruits and vegetables, fish, lean meats, fat-free or low-fat dairy foods, whole grains, and high-fiber foods. Lentils and dried peas and beans (legumes) are also good choices.  Limit salt if told by your doctor.  Cook in a healthy way. Roast, grill, broil, bake, poach, steam, or stir-fry foods.  Limit fluids as told by your doctor.  Weigh yourself every morning. Do this after you pee (urinate) and before you eat breakfast. Write down your weight to give to your doctor.  Take your blood pressure and write it down if your doctor tells you to.  Ask your doctor how to check your pulse. Check your pulse as told.  Lose weight if told by your doctor.  Stop smoking or chewing tobacco. Do not use gum or patches that help you quit without your doctor's approval.  Schedule and go to doctor visits as told.  Nonpregnant women should have no more than 1 drink a day. Men should have no more than 2  drinks a day. Talk to your doctor about drinking alcohol.  Stop illegal drug use.  Stay current with shots (immunizations).  Manage your health conditions as told by your doctor.  Learn to manage your stress.  Rest when you are tired.  If it is really hot outside:  Avoid intense activities.  Use air conditioning or fans, or get in a cooler place.  Avoid caffeine and alcohol.  Wear loose-fitting, lightweight, and light-colored clothing.  If it is really cold outside:  Avoid intense activities.  Layer your clothing.  Wear mittens or gloves, a hat, and a scarf when going outside.  Avoid alcohol.  Learn about heart failure and get support as needed.  Get help to maintain or improve your quality of life and your ability to care for yourself as needed. GET HELP IF:   You gain weight quickly.  You are more short of breath than usual.  You cannot do your normal activities.  You tire easily.  You cough more than normal, especially with activity.  You have any or more puffiness (swelling) in areas such as your hands, feet, ankles, or belly (abdomen).  You cannot sleep because it is hard to breathe.  You feel like your heart is beating fast (palpitations).  You get dizzy or light-headed when you stand up. GET HELP RIGHT AWAY IF:   You have trouble breathing.  There is a change in mental status, such as becoming  less alert or not being able to focus.  You have chest pain or discomfort.  You faint. MAKE SURE YOU:   Understand these instructions.  Will watch your condition.  Will get help right away if you are not doing well or get worse.   This information is not intended to replace advice given to you by your health care provider. Make sure you discuss any questions you have with your health care provider.   Document Released: 02/22/2008 Document Revised: 06/05/2014 Document Reviewed: 07/01/2012 Elsevier Interactive Patient Education [employer].

## 2015-06-01 NOTE — ED Notes (Signed)
Patient assistant to bedside command.

## 2015-06-01 NOTE — ED Notes (Signed)
Patient o2 89% patient placed on 2L

## 2015-06-01 NOTE — ED Notes (Signed)
Pt reports sob for several days and hasn't been sleeping. Has been feeling anxious lately. Denies cp but feels some sob. Also right shoulder pain from arthritis. She feels frightened when she feels sob and it makes it worse.

## 2015-06-01 NOTE — ED Provider Notes (Addendum)
CSN: [id number]     Arrival date & time 06/01/15  1335 History   First MD Initiated Contact with Patient 06/01/15 1720     Chief Complaint  Patient presents with  . Shortness of Breath     (Consider location/radiation/quality/duration/timing/severity/associated sxs/prior Treatment) HPI   Misty Shah is a 82 y.o. female who presents for evaluation of shortness of breath which is worse with supine, for the last several days. She stopped taking her fluid medications, for peripheral edema, at least a week ago. She took 20 mg of Lasix this morning because of the swelling in her legs. She lives alone. She is here with several family members. She denies chest pain, weakness or dizziness. She is taking her other medications as directed. She recently saw her PCP for routine regular exam. There were no problems at that time. There are no other known modifying factors.    Past Medical History  Diagnosis Date  . Hypertension   . Arthritis   . Hypothyroidism   . UTI (lower urinary tract infection)   . Anxiety   . Claustrophobia   . Dislocated shoulder     hx bilateral shoulders - had therapy on them  . Shingles    Past Surgical History  Procedure Laterality Date  . Cholecystectomy    . Thyroidectomy    . Tonsillectomy    . Appendectomy    . Hip surgery      right hip  . Colonoscopy    . Cataract extraction, bilateral    . Femur im nail  09/25/2011    Procedure: INTRAMEDULLARY (IM) NAIL FEMORAL;  Surgeon: Valeria Batman, MD;  Location: [facility];  Service: Orthopedics;  Laterality: Left;  left hip    No family history on file. Social History  Substance Use Topics  . Smoking status: Never Smoker   . Smokeless tobacco: Never Used  . Alcohol Use: No   OB History    No data available     Review of Systems  All other systems reviewed and are negative.     Allergies  Codeine; Epinephrine; Sulfur; and Tetanus toxoids  Home Medications   Prior to Admission medications    Medication Sig Start Date End Date Taking? Authorizing Provider  amLODipine (NORVASC) 5 MG tablet Take 5 mg by mouth daily.    Historical Provider, MD  aspirin EC 81 MG tablet Take 81 mg by mouth daily.    Historical Provider, MD  diclofenac sodium (VOLTAREN) 1 % GEL Apply 1 application topically 4 (four) times daily. 10/13/11   Mcarthur Rossetti Angiulli, PA-C  HYDROcodone-acetaminophen (NORCO/VICODIN) 5-325 MG per tablet Take 1 or 2 po Q 6hrs for pain 09/15/12   Devoria Albe, MD  levothyroxine (SYNTHROID, LEVOTHROID) 100 MCG tablet Take 100 mcg by mouth daily.    Historical Provider, MD  metoprolol (LOPRESSOR) 100 MG tablet Take 100 mg by mouth 2 (two) times daily.    Historical Provider, MD  traMADol (ULTRAM) 50 MG tablet Take 50 mg by mouth every 6 (six) hours as needed. For pain relief    Historical Provider, MD   BP 163/67 mmHg  Pulse 56  Temp(Src) 98.5 F (36.9 C) (Oral)  Resp 20  SpO2 94% Physical Exam  Constitutional: She is oriented to person, place, and time. She appears well-developed and well-nourished.  HENT:  Head: Normocephalic and atraumatic.  Right Ear: External ear normal.  Left Ear: External ear normal.  Eyes: Conjunctivae and EOM are normal. Pupils are equal, round,  and reactive to light.  Neck: Normal range of motion and phonation normal. Neck supple.  Cardiovascular: Normal rate, regular rhythm and normal heart sounds.   Pulmonary/Chest: Effort normal and breath sounds normal. No respiratory distress. She has no wheezes. She has no rales. She exhibits no bony tenderness.  Abdominal: Soft. There is no tenderness.  Musculoskeletal: Normal range of motion. She exhibits edema (2+ bilateral lower legs).  Neurological: She is alert and oriented to person, place, and time. No cranial nerve deficit or sensory deficit. She exhibits normal muscle tone. Coordination normal.  Skin: Skin is warm, dry and intact.  Psychiatric: She has a normal mood and affect. Her behavior is normal.  Judgment and thought content normal.  Nursing note and vitals reviewed.   ED Course  Procedures (including critical care time)  Medications - No data to display  Patient Vitals for the past 24 hrs:  BP Temp Temp src Pulse Resp SpO2  06/01/15 1830 - - - (!) 56 20 94 %  06/01/15 1827 - - - (!) 54 21 94 %  06/01/15 1800 163/67 mmHg - - (!) 55 22 (!) 89 %  06/01/15 1718 167/72 mmHg - - (!) 56 22 97 %  06/01/15 1716 - - - (!) 54 15 97 %  06/01/15 1357 159/78 mmHg 98.5 F (36.9 C) Oral (!) 54 16 94 %    7:41 PM Reevaluation with update and discussion. After initial assessment and treatment, an updated evaluation reveals no change in clinical status. Findings discussed with patient and parent members, all questions were answered. Johnell Landowski L    Labs Review Labs Reviewed  BASIC METABOLIC PANEL - Abnormal; Notable for the following:    Sodium 132 (*)    Chloride 96 (*)    Glucose, Bld 131 (*)    All other components within normal limits  I-STAT CHEM 8, ED - Abnormal; Notable for the following:    Sodium 134 (*)    Chloride 95 (*)    Glucose, Bld 123 (*)    All other components within normal limits  CBC  I-STAT TROPOININ, ED    Imaging Review Dg Chest 1 View  06/01/2015  CLINICAL DATA:  Shortness of breath last night and today. EXAM: CHEST 1 VIEW COMPARISON:  10/10/2011 FINDINGS: There is hyperinflation of the lungs compatible with COPD. Cardiomegaly. Diffuse interstitial opacities throughout the lungs, most likely edema. Bibasilar opacities likely reflect atelectasis. Small effusions. IMPRESSION: Suspect mild CHF.  Bibasilar atelectasis with small effusions. Electronically Signed   By: Charlett NoseKevin  Dover M.D.   On: 06/01/2015 14:58   I have personally reviewed and evaluated these images and lab results as part of my medical decision-making.   EKG Interpretation   Date/Time:  Tuesday June 01 2015 14:06:30 EST Ventricular Rate:  56 PR Interval:  194 QRS Duration: 116 QT  Interval:  482 QTC Calculation: 465 R Axis:   109 Text Interpretation:  Sinus bradycardia Right bundle branch block Septal  infarct , age undetermined Abnormal ECG Since last tracing Right bundle  branch block is new Confirmed by Effie ShyWENTZ  MD, Emilyann Banka [phone]([id number]) on 06/01/2015  6:47:28 PM      MDM   Final diagnoses:  Acute on chronic congestive heart failure, unspecified congestive heart failure type (HCC)    Evaluation consistent with mild pulmonary vascular congestion/CHF. On arrival, patient with normal oxygenation. Transient hypoxia, to 89% while supine in ED stretcher. No associated chest pain, metabolic instability or suggestion for impending vascular collapse. Doubt ACS.  Nursing Notes Reviewed/ Care Coordinated Applicable Imaging Reviewed Interpretation of Laboratory Data incorporated into ED treatment  The patient appears reasonably screened and/or stabilized for discharge and I doubt any other medical condition or other [facility] requiring further screening, evaluation, or treatment in the ED at this time prior to discharge.  Plan: Home Medications- Restart Lasix 40 mg BID for 1 week, then q day; Home Treatments- rest, elevate feet; return here if the recommended treatment, does not improve the symptoms; Recommended follow up- PCP 1 week and prn   Mancel Bale, MD 06/01/15 1945  Mancel Bale, MD 06/08/15 1141

## 2015-06-01 NOTE — ED Notes (Signed)
Edema noted to LE

## 2015-06-11 ENCOUNTER — Inpatient Hospital Stay (HOSPITAL_COMMUNITY)
Admission: EM | Admit: 2015-06-11 | Discharge: 2015-06-30 | DRG: 535 | Disposition: E | Payer: Medicare Other | Attending: Internal Medicine | Admitting: Internal Medicine

## 2015-06-11 ENCOUNTER — Emergency Department (HOSPITAL_COMMUNITY): Payer: Medicare Other

## 2015-06-11 ENCOUNTER — Encounter (HOSPITAL_COMMUNITY): Payer: Self-pay | Admitting: Family Medicine

## 2015-06-11 DIAGNOSIS — I4891 Unspecified atrial fibrillation: Secondary | ICD-10-CM | POA: Diagnosis not present

## 2015-06-11 DIAGNOSIS — E860 Dehydration: Secondary | ICD-10-CM

## 2015-06-11 DIAGNOSIS — Z66 Do not resuscitate: Secondary | ICD-10-CM | POA: Diagnosis not present

## 2015-06-11 DIAGNOSIS — Z79899 Other long term (current) drug therapy: Secondary | ICD-10-CM

## 2015-06-11 DIAGNOSIS — Z885 Allergy status to narcotic agent status: Secondary | ICD-10-CM | POA: Diagnosis not present

## 2015-06-11 DIAGNOSIS — R159 Full incontinence of feces: Secondary | ICD-10-CM | POA: Diagnosis present

## 2015-06-11 DIAGNOSIS — J440 Chronic obstructive pulmonary disease with acute lower respiratory infection: Secondary | ICD-10-CM | POA: Diagnosis not present

## 2015-06-11 DIAGNOSIS — I10 Essential (primary) hypertension: Secondary | ICD-10-CM | POA: Diagnosis present

## 2015-06-11 DIAGNOSIS — W19XXXA Unspecified fall, initial encounter: Secondary | ICD-10-CM | POA: Diagnosis present

## 2015-06-11 DIAGNOSIS — D62 Acute posthemorrhagic anemia: Secondary | ICD-10-CM | POA: Diagnosis present

## 2015-06-11 DIAGNOSIS — Z7982 Long term (current) use of aspirin: Secondary | ICD-10-CM | POA: Diagnosis not present

## 2015-06-11 DIAGNOSIS — W1809XA Striking against other object with subsequent fall, initial encounter: Secondary | ICD-10-CM

## 2015-06-11 DIAGNOSIS — J189 Pneumonia, unspecified organism: Secondary | ICD-10-CM | POA: Diagnosis not present

## 2015-06-11 DIAGNOSIS — F419 Anxiety disorder, unspecified: Secondary | ICD-10-CM | POA: Diagnosis present

## 2015-06-11 DIAGNOSIS — R54 Age-related physical debility: Secondary | ICD-10-CM | POA: Diagnosis present

## 2015-06-11 DIAGNOSIS — J9601 Acute respiratory failure with hypoxia: Secondary | ICD-10-CM | POA: Diagnosis not present

## 2015-06-11 DIAGNOSIS — H3530 Unspecified macular degeneration: Secondary | ICD-10-CM | POA: Diagnosis present

## 2015-06-11 DIAGNOSIS — R109 Unspecified abdominal pain: Secondary | ICD-10-CM

## 2015-06-11 DIAGNOSIS — S300XXA Contusion of lower back and pelvis, initial encounter: Secondary | ICD-10-CM | POA: Diagnosis present

## 2015-06-11 DIAGNOSIS — E871 Hypo-osmolality and hyponatremia: Secondary | ICD-10-CM

## 2015-06-11 DIAGNOSIS — S32502D Unspecified fracture of left pubis, subsequent encounter for fracture with routine healing: Secondary | ICD-10-CM | POA: Diagnosis not present

## 2015-06-11 DIAGNOSIS — E038 Other specified hypothyroidism: Secondary | ICD-10-CM

## 2015-06-11 DIAGNOSIS — S32502S Unspecified fracture of left pubis, sequela: Secondary | ICD-10-CM | POA: Diagnosis not present

## 2015-06-11 DIAGNOSIS — Z888 Allergy status to other drugs, medicaments and biological substances status: Secondary | ICD-10-CM

## 2015-06-11 DIAGNOSIS — Z887 Allergy status to serum and vaccine status: Secondary | ICD-10-CM | POA: Diagnosis not present

## 2015-06-11 DIAGNOSIS — Y95 Nosocomial condition: Secondary | ICD-10-CM | POA: Diagnosis present

## 2015-06-11 DIAGNOSIS — R0902 Hypoxemia: Secondary | ICD-10-CM

## 2015-06-11 DIAGNOSIS — L89150 Pressure ulcer of sacral region, unstageable: Secondary | ICD-10-CM | POA: Diagnosis not present

## 2015-06-11 DIAGNOSIS — E039 Hypothyroidism, unspecified: Secondary | ICD-10-CM | POA: Diagnosis present

## 2015-06-11 DIAGNOSIS — I4510 Unspecified right bundle-branch block: Secondary | ICD-10-CM | POA: Diagnosis present

## 2015-06-11 DIAGNOSIS — I480 Paroxysmal atrial fibrillation: Secondary | ICD-10-CM | POA: Diagnosis not present

## 2015-06-11 DIAGNOSIS — R0600 Dyspnea, unspecified: Secondary | ICD-10-CM | POA: Diagnosis not present

## 2015-06-11 DIAGNOSIS — B961 Klebsiella pneumoniae [K. pneumoniae] as the cause of diseases classified elsewhere: Secondary | ICD-10-CM | POA: Diagnosis not present

## 2015-06-11 DIAGNOSIS — Z7189 Other specified counseling: Secondary | ICD-10-CM | POA: Insufficient documentation

## 2015-06-11 DIAGNOSIS — N390 Urinary tract infection, site not specified: Secondary | ICD-10-CM | POA: Diagnosis not present

## 2015-06-11 DIAGNOSIS — N3000 Acute cystitis without hematuria: Secondary | ICD-10-CM | POA: Diagnosis not present

## 2015-06-11 DIAGNOSIS — E876 Hypokalemia: Secondary | ICD-10-CM | POA: Diagnosis present

## 2015-06-11 DIAGNOSIS — Z515 Encounter for palliative care: Secondary | ICD-10-CM | POA: Diagnosis not present

## 2015-06-11 DIAGNOSIS — L8990 Pressure ulcer of unspecified site, unspecified stage: Secondary | ICD-10-CM | POA: Insufficient documentation

## 2015-06-11 DIAGNOSIS — S32599A Other specified fracture of unspecified pubis, initial encounter for closed fracture: Secondary | ICD-10-CM | POA: Diagnosis present

## 2015-06-11 DIAGNOSIS — R32 Unspecified urinary incontinence: Secondary | ICD-10-CM | POA: Diagnosis present

## 2015-06-11 DIAGNOSIS — S32592A Other specified fracture of left pubis, initial encounter for closed fracture: Principal | ICD-10-CM | POA: Diagnosis present

## 2015-06-11 HISTORY — DX: Unspecified macular degeneration: H35.30

## 2015-06-11 HISTORY — DX: Other specified fracture of left pubis, initial encounter for closed fracture: S32.592A

## 2015-06-11 LAB — URINALYSIS, ROUTINE W REFLEX MICROSCOPIC
BILIRUBIN URINE: NEGATIVE
Glucose, UA: NEGATIVE mg/dL
Hgb urine dipstick: NEGATIVE
Ketones, ur: NEGATIVE mg/dL
NITRITE: NEGATIVE
PH: 6.5 (ref 5.0–8.0)
Protein, ur: NEGATIVE mg/dL
SPECIFIC GRAVITY, URINE: 1.01 (ref 1.005–1.030)

## 2015-06-11 LAB — CBC WITH DIFFERENTIAL/PLATELET
BASOS ABS: 0.1 10*3/uL (ref 0.0–0.1)
BASOS PCT: 1 %
EOS ABS: 0.6 10*3/uL (ref 0.0–0.7)
EOS PCT: 7 %
HCT: 32 % — ABNORMAL LOW (ref 36.0–46.0)
Hemoglobin: 10.1 g/dL — ABNORMAL LOW (ref 12.0–15.0)
Lymphocytes Relative: 16 %
Lymphs Abs: 1.5 10*3/uL (ref 0.7–4.0)
MCH: 29.4 pg (ref 26.0–34.0)
MCHC: 31.6 g/dL (ref 30.0–36.0)
MCV: 93.3 fL (ref 78.0–100.0)
MONO ABS: 1.1 10*3/uL — AB (ref 0.1–1.0)
Monocytes Relative: 12 %
Neutro Abs: 5.9 10*3/uL (ref 1.7–7.7)
Neutrophils Relative %: 64 %
PLATELETS: 289 10*3/uL (ref 150–400)
RBC: 3.43 MIL/uL — ABNORMAL LOW (ref 3.87–5.11)
RDW: 14.1 % (ref 11.5–15.5)
WBC: 9.2 10*3/uL (ref 4.0–10.5)

## 2015-06-11 LAB — TYPE AND SCREEN
ABO/RH(D): B POS
Antibody Screen: NEGATIVE

## 2015-06-11 LAB — BASIC METABOLIC PANEL
Anion gap: 9 (ref 5–15)
BUN: 20 mg/dL (ref 6–20)
CALCIUM: 9.6 mg/dL (ref 8.9–10.3)
CO2: 33 mmol/L — ABNORMAL HIGH (ref 22–32)
CREATININE: 0.95 mg/dL (ref 0.44–1.00)
Chloride: 97 mmol/L — ABNORMAL LOW (ref 101–111)
GFR calc Af Amer: 56 mL/min — ABNORMAL LOW (ref >60)
GFR, EST NON AFRICAN AMERICAN: 49 mL/min — AB (ref >60)
GLUCOSE: 117 mg/dL — AB (ref 65–99)
Potassium: 4.3 mmol/L (ref 3.5–5.1)
Sodium: 139 mmol/L (ref 135–145)

## 2015-06-11 LAB — PROTIME-INR
INR: 1.16 (ref 0.00–1.49)
Prothrombin Time: 15 seconds (ref 11.6–15.2)

## 2015-06-11 LAB — URINE MICROSCOPIC-ADD ON: RBC / HPF: NONE SEEN RBC/hpf (ref 0–5)

## 2015-06-11 LAB — ABO/RH: ABO/RH(D): B POS

## 2015-06-11 MED ORDER — METOPROLOL TARTRATE 12.5 MG HALF TABLET
12.5000 mg | ORAL_TABLET | Freq: Two times a day (BID) | ORAL | Status: DC
  Administered 2015-06-11 – 2015-06-19 (×16): 12.5 mg via ORAL
  Filled 2015-06-11 (×16): qty 1

## 2015-06-11 MED ORDER — ASPIRIN EC 81 MG PO TBEC
81.0000 mg | DELAYED_RELEASE_TABLET | Freq: Every day | ORAL | Status: DC
  Administered 2015-06-12 – 2015-06-16 (×5): 81 mg via ORAL
  Filled 2015-06-11 (×5): qty 1

## 2015-06-11 MED ORDER — MORPHINE SULFATE (PF) 2 MG/ML IV SOLN: 0.5000 mg | INTRAVENOUS | Status: DC | PRN

## 2015-06-11 MED ORDER — SODIUM CHLORIDE 0.9 % IV SOLN
20.0000 mL | INTRAVENOUS | Status: DC
  Administered 2015-06-11: 20 mL via INTRAVENOUS

## 2015-06-11 MED ORDER — DIAZEPAM 2 MG PO TABS
2.0000 mg | ORAL_TABLET | Freq: Four times a day (QID) | ORAL | Status: DC | PRN
  Administered 2015-06-12 – 2015-06-22 (×13): 2 mg via ORAL
  Filled 2015-06-11 (×14): qty 1

## 2015-06-11 MED ORDER — ENOXAPARIN SODIUM 40 MG/0.4ML ~~LOC~~ SOLN: 40.0000 mg | SUBCUTANEOUS | Status: DC

## 2015-06-11 MED ORDER — SODIUM CHLORIDE 0.9 % IV SOLN
INTRAVENOUS | Status: DC
  Administered 2015-06-11: 22:00:00 via INTRAVENOUS
  Filled 2015-06-11: qty 1000

## 2015-06-11 MED ORDER — TRAMADOL HCL 50 MG PO TABS
50.0000 mg | ORAL_TABLET | Freq: Four times a day (QID) | ORAL | Status: DC | PRN
  Administered 2015-06-12 – 2015-06-22 (×15): 50 mg via ORAL
  Filled 2015-06-11 (×16): qty 1

## 2015-06-11 MED ORDER — LEVOTHYROXINE SODIUM 100 MCG PO TABS
100.0000 ug | ORAL_TABLET | Freq: Every day | ORAL | Status: DC
  Administered 2015-06-12 – 2015-06-23 (×12): 100 ug via ORAL
  Filled 2015-06-11 (×12): qty 1

## 2015-06-11 MED ORDER — HYDROCODONE-ACETAMINOPHEN 5-325 MG PO TABS: 1.0000 | ORAL_TABLET | Freq: Four times a day (QID) | ORAL | Status: DC | PRN
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
## 2015-06-11 NOTE — Progress Notes (Addendum)
Received patient from ED. Patient AOx3, VS stable, pain 2/10, O2Sat at 93% on RA. Patient oriented to room, bed controls and call light.  Patient resting on bed comfortably watching  TV.  Admission made aware of recent admission.

## 2015-06-11 NOTE — ED Provider Notes (Signed)
Medical screening examination/treatment/procedure(s) were conducted as a shared visit with non-physician practitioner(s) and myself.  I personally evaluated the patient during the encounter.  82 year old female who fell a few days ago had progressively worsening pelvic pain since that time with see her primary doctor found to have a pelvis fracture sent here. Examination she does not seem to have an unstable pelvis however she has had tenderness with lateral compression of her pelvis. Patient has difficulty walking so will be admitted for pain control.   Marily MemosJason Marcelino Campos, MD 06/12/15 [phone]

## 2015-06-11 NOTE — Progress Notes (Signed)
xrays reviewed left sup and inf pubic rami fxs nwb left side ok to wb on right Will see in am

## 2015-06-12 ENCOUNTER — Encounter (HOSPITAL_COMMUNITY): Payer: Self-pay | Admitting: General Practice

## 2015-06-12 LAB — CBC
HEMATOCRIT: 32.6 % — AB (ref 36.0–46.0)
Hemoglobin: 10.5 g/dL — ABNORMAL LOW (ref 12.0–15.0)
MCH: 30.4 pg (ref 26.0–34.0)
MCHC: 32.2 g/dL (ref 30.0–36.0)
MCV: 94.5 fL (ref 78.0–100.0)
Platelets: 285 10*3/uL (ref 150–400)
RBC: 3.45 MIL/uL — ABNORMAL LOW (ref 3.87–5.11)
RDW: 14.4 % (ref 11.5–15.5)
WBC: 10.2 10*3/uL (ref 4.0–10.5)

## 2015-06-12 LAB — BASIC METABOLIC PANEL
ANION GAP: 12 (ref 5–15)
BUN: 14 mg/dL (ref 6–20)
CALCIUM: 9.3 mg/dL (ref 8.9–10.3)
CO2: 26 mmol/L (ref 22–32)
Chloride: 101 mmol/L (ref 101–111)
Creatinine, Ser: 0.84 mg/dL (ref 0.44–1.00)
GFR calc Af Amer: >60 mL/min (ref >60)
GFR calc non Af Amer: 56 mL/min — ABNORMAL LOW (ref >60)
GLUCOSE: 111 mg/dL — AB (ref 65–99)
Potassium: 4 mmol/L (ref 3.5–5.1)
Sodium: 139 mmol/L (ref 135–145)

## 2015-06-12 MED ORDER — SODIUM CHLORIDE 0.9 % IV SOLN: 20.0000 mL | INTRAVENOUS | Status: DC

## 2015-06-12 MED ORDER — ENOXAPARIN SODIUM 30 MG/0.3ML ~~LOC~~ SOLN
30.0000 mg | SUBCUTANEOUS | Status: DC
  Administered 2015-06-12 – 2015-06-16 (×5): 30 mg via SUBCUTANEOUS
  Filled 2015-06-12 (×5): qty 0.3

## 2015-06-12 MED ORDER — WHITE PETROLATUM GEL
Status: AC
  Administered 2015-06-12: 0.2
  Filled 2015-06-12: qty 1

## 2015-06-12 MED ORDER — ENSURE ENLIVE PO LIQD
237.0000 mL | Freq: Every day | ORAL | Status: DC
  Administered 2015-06-14: 237 mL via ORAL

## 2015-06-12 NOTE — Consult Note (Signed)
Reason for Consult:hip pain Referring Physician: Dr Nigel Mormon Misty Shah is an 82 y.o. female.  HPI: Misty Shah is a 82 year old patient sustained a mechanical fall on the day of admission. She reports pain in the left hip region. She has had prior hip fracture fixation in both hips. Workup demonstrated inferior and superior pubic rami fractures on the left. Patient does use a walker at home.  Past Medical History  Diagnosis Date  . Hypertension   . Hypothyroidism   . UTI (lower urinary tract infection)   . Anxiety   . Claustrophobia   . Dislocated shoulder     hx bilateral shoulders - had therapy on them  . Shingles   . Fracture of ramus of left pubis (HCC) 05/2015    displaced left superior and inferior pubic rami fractures.  . Macular degeneration of both eyes   . Arthritis     "all over"    Past Surgical History  Procedure Laterality Date  . Cholecystectomy    . Thyroidectomy    . Tonsillectomy    . Appendectomy    . Hip fracture surgery Right 10/2001    Manipulation and compression screw fixation, right hip fracture/notes 10/11/2010  . Colonoscopy    . Cataract extraction, bilateral    . Femur im nail  09/25/2011    Procedure: INTRAMEDULLARY (IM) NAIL FEMORAL;  Surgeon: Garald Balding, MD;  Location: [facility];  Service: Orthopedics;  Laterality: Left;  left hip   . Fracture surgery    . Cataract extraction w/ intraocular lens  implant, bilateral Bilateral     History reviewed. No pertinent family history.  Social History:  reports that she has never smoked. She has never used smokeless tobacco. She reports that she does not drink alcohol or use illicit drugs.  Allergies:  Allergies  Allergen Reactions  . Codeine Other (See Comments)    unknown  . Epinephrine Other (See Comments)    unknown  . Sulfur Other (See Comments)    unknown  . Tetanus Toxoids Other (See Comments)    unknown    Medications: I have reviewed the patient's current medications.  Results for  orders placed or performed during the hospital encounter of 06/08/2015 (from the past 48 hour(s))  Basic metabolic panel     Status: Abnormal   Collection Time: 06/16/2015  5:43 PM  Result Value Ref Range   Sodium 139 135 - 145 mmol/L   Potassium 4.3 3.5 - 5.1 mmol/L   Chloride 97 (L) 101 - 111 mmol/L   CO2 33 (H) 22 - 32 mmol/L   Glucose, Bld 117 (H) 65 - 99 mg/dL   BUN 20 6 - 20 mg/dL   Creatinine, Ser 0.95 0.44 - 1.00 mg/dL   Calcium 9.6 8.9 - 10.3 mg/dL   GFR calc non Af Amer 49 (L) >60 mL/min   GFR calc Af Amer 56 (L) >60 mL/min    Comment: (NOTE) The eGFR has been calculated using the CKD EPI equation. This calculation has not been validated in all clinical situations. eGFR's persistently <60 mL/min signify possible Chronic Kidney Disease.    Anion gap 9 5 - 15  CBC WITH DIFFERENTIAL     Status: Abnormal   Collection Time: 06/24/2015  5:43 PM  Result Value Ref Range   WBC 9.2 4.0 - 10.5 K/uL   RBC 3.43 (L) 3.87 - 5.11 MIL/uL   Hemoglobin 10.1 (L) 12.0 - 15.0 g/dL   HCT 32.0 (L) 36.0 - 46.0 %  MCV 93.3 78.0 - 100.0 fL   MCH 29.4 26.0 - 34.0 pg   MCHC 31.6 30.0 - 36.0 g/dL   RDW 14.1 11.5 - 15.5 %   Platelets 289 150 - 400 K/uL   Neutrophils Relative % 64 %   Neutro Abs 5.9 1.7 - 7.7 K/uL   Lymphocytes Relative 16 %   Lymphs Abs 1.5 0.7 - 4.0 K/uL   Monocytes Relative 12 %   Monocytes Absolute 1.1 (H) 0.1 - 1.0 K/uL   Eosinophils Relative 7 %   Eosinophils Absolute 0.6 0.0 - 0.7 K/uL   Basophils Relative 1 %   Basophils Absolute 0.1 0.0 - 0.1 K/uL  Protime-INR     Status: None   Collection Time: 06/09/2015  5:43 PM  Result Value Ref Range   Prothrombin Time 15.0 11.6 - 15.2 seconds   INR 1.16 0.00 - 1.49  Type and screen [place]     Status: None   Collection Time: 06/03/2015  5:43 PM  Result Value Ref Range   ABO/RH(D) B POS    Antibody Screen NEG    Sample Expiration 06/14/2015   ABO/Rh     Status: None   Collection Time: 06/07/2015  5:43 PM   Result Value Ref Range   ABO/RH(D) B POS   Urinalysis, Routine w reflex microscopic (not at [facility])     Status: Abnormal   Collection Time: 06/02/2015  8:12 PM  Result Value Ref Range   Color, Urine YELLOW YELLOW   APPearance CLEAR CLEAR   Specific Gravity, Urine 1.010 1.005 - 1.030   pH 6.5 5.0 - 8.0   Glucose, UA NEGATIVE NEGATIVE mg/dL   Hgb urine dipstick NEGATIVE NEGATIVE   Bilirubin Urine NEGATIVE NEGATIVE   Ketones, ur NEGATIVE NEGATIVE mg/dL   Protein, ur NEGATIVE NEGATIVE mg/dL   Nitrite NEGATIVE NEGATIVE   Leukocytes, UA SMALL (A) NEGATIVE  Urine microscopic-add on     Status: Abnormal   Collection Time: 06/22/2015  8:12 PM  Result Value Ref Range   Squamous Epithelial / LPF 0-5 (A) NONE SEEN   WBC, UA 6-30 0 - 5 WBC/hpf   RBC / HPF NONE SEEN 0 - 5 RBC/hpf   Bacteria, UA MANY (A) NONE SEEN   Urine-Other LESS THAN 10 mL OF URINE SUBMITTED   CBC     Status: Abnormal   Collection Time: 06/12/15  4:42 AM  Result Value Ref Range   WBC 10.2 4.0 - 10.5 K/uL   RBC 3.45 (L) 3.87 - 5.11 MIL/uL   Hemoglobin 10.5 (L) 12.0 - 15.0 g/dL   HCT 32.6 (L) 36.0 - 46.0 %   MCV 94.5 78.0 - 100.0 fL   MCH 30.4 26.0 - 34.0 pg   MCHC 32.2 30.0 - 36.0 g/dL   RDW 14.4 11.5 - 15.5 %   Platelets 285 150 - 400 K/uL  Basic metabolic panel     Status: Abnormal   Collection Time: 06/12/15  4:42 AM  Result Value Ref Range   Sodium 139 135 - 145 mmol/L   Potassium 4.0 3.5 - 5.1 mmol/L   Chloride 101 101 - 111 mmol/L   CO2 26 22 - 32 mmol/L   Glucose, Bld 111 (H) 65 - 99 mg/dL   BUN 14 6 - 20 mg/dL   Creatinine, Ser 0.84 0.44 - 1.00 mg/dL   Calcium 9.3 8.9 - 10.3 mg/dL   GFR calc non Af Amer 56 (L) >60 mL/min   GFR calc Af  Amer >60 >60 mL/min    Comment: (NOTE) The eGFR has been calculated using the CKD EPI equation. This calculation has not been validated in all clinical situations. eGFR's persistently <60 mL/min signify possible Chronic Kidney Disease.    Anion gap 12 5 - 15    Dg Chest  1 View  06/29/2015  CLINICAL DATA:  Fall. EXAM: CHEST 1 VIEW COMPARISON:  June 01, 2015. FINDINGS: Stable cardiomediastinal silhouette. Moderate dextroscoliosis of thoracic spine is noted. No pneumothorax or pleural effusion is noted. Degenerative change of right glenohumeral joint is noted. No acute pulmonary disease is noted. IMPRESSION: No acute cardiopulmonary abnormality seen. Electronically Signed   By: Marijo Conception, M.D.   On: 05/30/2015 18:25   Dg Hip Unilat With Pelvis 2-3 Views Left  06/17/2015  CLINICAL DATA:  Left hip pain after fall today. EXAM: DG HIP (WITH OR WITHOUT PELVIS) 2-3V LEFT COMPARISON:  September 25, 2011. FINDINGS: Status post surgical internal fixation of both proximal femurs. The are noted displaced fractures involving the left superior and inferior pubic rami. No acute abnormality seen involving either hip joint or proximal femur. Joint spaces appear to be intact. Diffuse osteopenia is noted. IMPRESSION: Status post surgical internal fixation of both proximal femurs. Displaced left superior and inferior pubic rami fractures are noted. Electronically Signed   By: Marijo Conception, M.D.   On: 06/25/2015 18:24    Review of Systems  Constitutional: Negative.   HENT: Negative.   Eyes: Negative.   Respiratory: Negative.   Cardiovascular: Negative.   Gastrointestinal: Negative.   Genitourinary: Negative.   Musculoskeletal: Positive for joint pain.  Skin: Negative.   Neurological: Negative.   Endo/Heme/Allergies: Negative.   Psychiatric/Behavioral: Negative.    Blood pressure 135/61, pulse 80, temperature 98.2 F (36.8 C), temperature source Oral, resp. rate 18, height 5' (1.524 m), weight 51.5 kg (113 lb 8.6 oz), SpO2 95 %. Physical Exam  Constitutional: She appears well-developed.  HENT:  Head: Normocephalic.  Eyes: Pupils are equal, round, and reactive to light.  Neck: Normal range of motion.  Cardiovascular: Normal rate.   Respiratory: Effort normal.   Neurological: She is alert.  Skin: Skin is warm.  Psychiatric: She has a normal mood and affect.   examination of the right hip demonstrates no pain with range of motion of the hip knee or ankle small bruise on the mid tibial shaft is noted no effusion in the knee is present ankle dorsi/plantar flexion is intact pedal pulses palpable on the left-hand side no bruising is present ankle dorsi flexion plantar flexion is intact knee and ankle range of motion is nontender hip range of motion is surprisingly only mildly tender bilateral upper extremities have good passive range of motion of the wrist elbows and shoulders without bruising ecchymosis or crepitus  Assessment/Plan: Impression is left hip superior and inferior pubic rami fractures which are nondisplaced. If she singly the patient is not particularly that symptomatic with range of motion of the hip on the left-hand side. Plan mobilize weightbearing as tolerated on the right partial weightbearing on the left anticipate skilled nursing facility stay for 7-14 days prior to being safe to discharge to home from an orthopedic perspective she is ready for discharge to skilled nursing facility  [place] 06/12/2015, 10:58 AM

## 2015-06-12 NOTE — Progress Notes (Addendum)
PROGRESS NOTE    Misty Shah ZOX:[id number] DOB: Oct 10, 1915 DOA: 06/25/2015 PCP: Michiel Sites, MD  HPI/Brief narrative 82 year old female, lives alone, ambulates with the help of a walker, history of HTN, hypothyroid, anxiety, presented to [facility] ED on Jun 25, 2015 following a mechanical fall at home when she tried to open her refrigerator door that felt stuck. She sustained this fall on 06/02/15 but was unable to see her physician until 06/25/2015 and was "roughing it out" with the help of her son. X-ray of the hip confirmed a displaced left superior and inferior pubic rami fractures. Orthopedics consulted and recommended nonoperative management. PT and OT evaluation pending.  Assessment/Plan:   Left hip superior and inferior pubic rami fracture, displaced - Orthopedic evaluation appreciated. Discussed with Dr. August Saucer: Recommends partial weightbearing on the left, pain management, PT and OT evaluation and possible DC to SNF.  Essential hypertension - Amlodipine held. On reduced dose of metoprolol. Reasonably controlled.  Dehydration - Resolved after IV fluids. DC IV fluids. Resumed diet.  Hypothyroid - Continue Synthroid.  Anxiety - When necessary Valium  RBBB - Not new.  Anemia - Stable    DVT prophylaxis: Lovenox  Code Status: Full Family Communication: Discussed with son at bedside on 1/14 Disposition Plan: Possibly SNF pending therapy evaluation   Consultants:  Orthopedics  Procedures:  None  Antimicrobials:  None   Subjective: Mild left hip region pain. Denies any other complaints.  Objective: Filed Vitals:   06/12/15 0456 06/12/15 0657 06/12/15 0924 06/12/15 1323  BP: 133/56  135/61 149/55  Pulse: 82  80 81  Temp: 98.2 F (36.8 C)  98.2 F (36.8 C) 97.9 F (36.6 C)  TempSrc: Oral  Oral Oral  Resp: 17  18 20   Height:      Weight:      SpO2: 95% 95% 95% 97%    Intake/Output Summary (Last 24 hours) at 06/12/15 1414 Last data filed at 06/12/15  1330  Gross per 24 hour  Intake   1245 ml  Output   1395 ml  Net   -150 ml   Filed Weights   Jun 25, 2015 2145  Weight: 51.5 kg (113 lb 8.6 oz)    Exam:  General exam: Pleasant elderly frail female patient lying comfortably supine in bed. Respiratory system: Clear. No increased work of breathing. Cardiovascular system: S1 & S2 heard, RRR. No JVD, murmurs, gallops, clicks or pedal edema. Gastrointestinal system: Abdomen is nondistended, soft and nontender. Normal bowel sounds heard. Central nervous system: Alert and oriented. No focal neurological deficits. Extremities: Symmetric 5 x 5 power.   Data Reviewed: Basic Metabolic Panel:  Recent Labs Lab 06/25/2015 1743 06/12/15 0442  NA 139 139  K 4.3 4.0  CL 97* 101  CO2 33* 26  GLUCOSE 117* 111*  BUN 20 14  CREATININE 0.95 0.84  CALCIUM 9.6 9.3   Liver Function Tests: No results for input(s): AST, ALT, ALKPHOS, BILITOT, PROT, ALBUMIN in the last 168 hours. No results for input(s): LIPASE, AMYLASE in the last 168 hours. No results for input(s): AMMONIA in the last 168 hours. CBC:  Recent Labs Lab 2015/06/25 1743 06/12/15 0442  WBC 9.2 10.2  NEUTROABS 5.9  --   HGB 10.1* 10.5*  HCT 32.0* 32.6*  MCV 93.3 94.5  PLT 289 285   Cardiac Enzymes: No results for input(s): CKTOTAL, CKMB, CKMBINDEX, TROPONINI in the last 168 hours. BNP (last 3 results) No results for input(s): PROBNP in the last 8760 hours. CBG: No results for input(s):  GLUCAP in the last 168 hours.  No results found for this or any previous visit (from the past 240 hour(s)).       Studies: Dg Chest 1 View  14-Nov-2015  CLINICAL DATA:  Fall. EXAM: CHEST 1 VIEW COMPARISON:  June 01, 2015. FINDINGS: Stable cardiomediastinal silhouette. Moderate dextroscoliosis of thoracic spine is noted. No pneumothorax or pleural effusion is noted. Degenerative change of right glenohumeral joint is noted. No acute pulmonary disease is noted. IMPRESSION: No acute  cardiopulmonary abnormality seen. Electronically Signed   By: Lupita RaiderJames  Green Jr, M.D.   On: 018-Jun-2017 18:25   Dg Hip Unilat With Pelvis 2-3 Views Left  14-Nov-2015  CLINICAL DATA:  Left hip pain after fall today. EXAM: DG HIP (WITH OR WITHOUT PELVIS) 2-3V LEFT COMPARISON:  September 25, 2011. FINDINGS: Status post surgical internal fixation of both proximal femurs. The are noted displaced fractures involving the left superior and inferior pubic rami. No acute abnormality seen involving either hip joint or proximal femur. Joint spaces appear to be intact. Diffuse osteopenia is noted. IMPRESSION: Status post surgical internal fixation of both proximal femurs. Displaced left superior and inferior pubic rami fractures are noted. Electronically Signed   By: Lupita RaiderJames  Green Jr, M.D.   On: 018-Jun-2017 18:24        Scheduled Meds: . aspirin EC  81 mg Oral Daily  . enoxaparin (LOVENOX) injection  30 mg Subcutaneous Q24H  . levothyroxine  100 mcg Oral QAC breakfast  . metoprolol  12.5 mg Oral BID   Continuous Infusions: . sodium chloride 20 mL (06/12/15 1103)  . sodium chloride 0.9 % 1,000 mL infusion 50 mL/hr at 07/24/2015 2155    Active Problems:   Hypertension   Hypothyroidism   Fracture of multiple pubic rami (HCC)   Dehydration    Time spent: 20 minutes    Shey Yott, MD, FACP, FHM. [facility] Pager [phone]  If 7PM-7AM, please contact night-coverage [email or web address] Password [email or web address] 06/12/2015, 2:14 PM    LOS: 1 day

## 2015-06-12 NOTE — Progress Notes (Signed)
Initial Nutrition Assessment  DOCUMENTATION CODES:   Not applicable  INTERVENTION:  Provide Ensure Enlive po once daily, each supplement provides 350 kcal and 20 grams of protein  Encourage adequate PO intake.   NUTRITION DIAGNOSIS:   Increased nutrient needs related to  (healing) as evidenced by estimated needs.  GOAL:   Patient will meet greater than or equal to 90% of their needs  MONITOR:   PO intake, Supplement acceptance, Weight trends, Labs, I & O's, Skin  REASON FOR ASSESSMENT:   Consult Hip fracture protocol  ASSESSMENT:   82 year old female with a history of hypertension, hypothyroidism, anxiety presented with a mechanical fall that occurred on 06/02/2015 when she tried to open her refrigerator door that was stuck closed. Because of the inclement weather, the patient did not make it to see her physician until today, 2015/06/04. She has been trying to "rough it out" with the help of her son who has been helping her with transfers and getting to the bathroom. However, the patient's pain did not remit. As a result, she went to see her primary care provider. X-rays revealed a pelvic fracture.   Meal completion has been 75%. Pt reports having a good appetite currently and PTA with consumption of at least 3 meals a day with no other difficulties. Pt with no weight loss. Pt is agreeable to Ensure to aid in caloric and protein needs. Pt was encouraged to eat her food at meals.   Nutrition-Focused physical exam completed. Findings are no fat depletion, severe muscle depletion, and mild edema.   Labs and medications reviewed.   Diet Order:  Diet Heart Room service appropriate?: Yes; Fluid consistency:: Thin  Skin:  Reviewed, no issues  Last BM:  Unknown  Height:   Ht Readings from Last 1 Encounters:  01/20/2016 5' (1.524 m)    Weight:   Wt Readings from Last 1 Encounters:  01/20/2016 113 lb 8.6 oz (51.5 kg)    Ideal Body Weight:  45.45 kg  BMI:  Body mass index  is 22.17 kg/(m^2).  Estimated Nutritional Needs:   Kcal:  1350-1550  Protein:  55-70 grams  Fluid:  >/= 1.5 L/day  EDUCATION NEEDS:   No education needs identified at this time  Roslyn SmilingStephanie Kavya Haag, MS, RD, LDN Pager # [phone] After hours/ weekend pager # [phone]

## 2015-06-13 MED ORDER — ENOXAPARIN SODIUM 30 MG/0.3ML ~~LOC~~ SOLN: 30.0000 mg | SUBCUTANEOUS | Status: DC

## 2015-06-13 NOTE — Progress Notes (Addendum)
Ambulated with PT this am Ok for [facility] placement [facility] for pain and lovenox for dvt prophylaxis wbat F/u 3 weeks

## 2015-06-13 NOTE — Progress Notes (Signed)
PROGRESS NOTE    Misty Shah:[id number] DOB: 1915/07/31 DOA: 06/13/2015 PCP: Michiel Sites, MD  HPI/Brief narrative 82 year old female, lives alone, ambulates with the help of a walker, history of HTN, hypothyroid, anxiety, presented to [facility] ED on 06/22/2015 following a mechanical fall at home when she tried to open her refrigerator door that felt stuck. She sustained this fall on 06/02/15 but was unable to see her physician until 06/29/2015 and was "roughing it out" with the help of her son. X-ray of the hip confirmed a displaced left superior and inferior pubic rami fractures. Orthopedics consulted and recommended nonoperative management. PT evaluated and recommended SNF. Social worker consulted.  Assessment/Plan:   Left hip superior and inferior pubic rami fracture, displaced - Orthopedic follow-up appreciated. Recommends partial weightbearing on the left, pain management, PT and OT evaluation and possible DC to SNF. - PT recommends SNF. Per orthopedics, tramadol for pain and Lovenox for DVT prophylaxis.  Essential hypertension - Amlodipine held. On reduced dose of metoprolol. Reasonably controlled.  Dehydration - Resolved after IV fluids. DC IV fluids. Resumed diet.  Hypothyroid - Continue Synthroid.  Anxiety - When necessary Valium  RBBB - Not new.  Anemia - Stable    DVT prophylaxis: Lovenox  Code Status: Full Family Communication: Discussed with son at bedside on 1/15.  Disposition Plan: DC to SNF when bed available.   Consultants:  Orthopedics  Procedures:  Foley- DC'ed 1/15  Antimicrobials:  None   Subjective: Mild left hip region pain. Denies any other complaints.  Objective: Filed Vitals:   06/12/15 2133 06/13/15 0210 06/13/15 0640 06/13/15 1009  BP: 135/61 136/59 129/63 126/47  Pulse: 95 84 79 74  Temp: 98.4 F (36.9 C) 98.2 F (36.8 C) 98 F (36.7 C) 99 F (37.2 C)  TempSrc: Oral   Oral  Resp: 19 16 15 17   Height:      Weight:        SpO2: 93% 92% 93% 93%    Intake/Output Summary (Last 24 hours) at 06/13/15 1324 Last data filed at 06/13/15 1153  Gross per 24 hour  Intake    480 ml  Output   1750 ml  Net  -1270 ml   Filed Weights   06/18/2015 2145  Weight: 51.5 kg (113 lb 8.6 oz)    Exam:  General exam: Pleasant elderly frail female patient sitting up comfortably in chair this am. Son at bedside. Respiratory system: Clear. No increased work of breathing. Cardiovascular system: S1 & S2 heard, RRR. No JVD, murmurs, gallops, clicks or pedal edema. Gastrointestinal system: Abdomen is nondistended, soft and nontender. Normal bowel sounds heard. Central nervous system: Alert and oriented. No focal neurological deficits. Extremities: Symmetric 5 x 5 power.   Data Reviewed: Basic Metabolic Panel:  Recent Labs Lab 06/03/2015 1743 06/12/15 0442  NA 139 139  K 4.3 4.0  CL 97* 101  CO2 33* 26  GLUCOSE 117* 111*  BUN 20 14  CREATININE 0.95 0.84  CALCIUM 9.6 9.3   Liver Function Tests: No results for input(s): AST, ALT, ALKPHOS, BILITOT, PROT, ALBUMIN in the last 168 hours. No results for input(s): LIPASE, AMYLASE in the last 168 hours. No results for input(s): AMMONIA in the last 168 hours. CBC:  Recent Labs Lab 06/15/2015 1743 06/12/15 0442  WBC 9.2 10.2  NEUTROABS 5.9  --   HGB 10.1* 10.5*  HCT 32.0* 32.6*  MCV 93.3 94.5  PLT 289 285   Cardiac Enzymes: No results for input(s): CKTOTAL, CKMB,  CKMBINDEX, TROPONINI in the last 168 hours. BNP (last 3 results) No results for input(s): PROBNP in the last 8760 hours. CBG: No results for input(s): GLUCAP in the last 168 hours.  No results found for this or any previous visit (from the past 240 hour(s)).       Studies: Dg Chest 1 View  10/01/2015  CLINICAL DATA:  Fall. EXAM: CHEST 1 VIEW COMPARISON:  June 01, 2015. FINDINGS: Stable cardiomediastinal silhouette. Moderate dextroscoliosis of thoracic spine is noted. No pneumothorax or pleural  effusion is noted. Degenerative change of right glenohumeral joint is noted. No acute pulmonary disease is noted. IMPRESSION: No acute cardiopulmonary abnormality seen. Electronically Signed   By: Lupita RaiderJames  Green Jr, M.D.   On: 005/09/2015 18:25   Dg Hip Unilat With Pelvis 2-3 Views Left  10/01/2015  CLINICAL DATA:  Left hip pain after fall today. EXAM: DG HIP (WITH OR WITHOUT PELVIS) 2-3V LEFT COMPARISON:  September 25, 2011. FINDINGS: Status post surgical internal fixation of both proximal femurs. The are noted displaced fractures involving the left superior and inferior pubic rami. No acute abnormality seen involving either hip joint or proximal femur. Joint spaces appear to be intact. Diffuse osteopenia is noted. IMPRESSION: Status post surgical internal fixation of both proximal femurs. Displaced left superior and inferior pubic rami fractures are noted. Electronically Signed   By: Lupita RaiderJames  Green Jr, M.D.   On: 005/09/2015 18:24        Scheduled Meds: . aspirin EC  81 mg Oral Daily  . enoxaparin (LOVENOX) injection  30 mg Subcutaneous Q24H  . feeding supplement (ENSURE ENLIVE)  237 mL Oral Q1500  . levothyroxine  100 mcg Oral QAC breakfast  . metoprolol  12.5 mg Oral BID   Continuous Infusions: . sodium chloride 20 mL (06/12/15 1103)  . sodium chloride 0.9 % 1,000 mL infusion 50 mL/hr at 11/27/15 2155    Active Problems:   Hypertension   Hypothyroidism   Fracture of multiple pubic rami (HCC)   Dehydration    Time spent: 20 minutes    Lezly Rumpf, MD, FACP, FHM. [facility] Pager [phone]  If 7PM-7AM, please contact night-coverage [email or web address] Password [email or web address] 06/13/2015, 1:24 PM    LOS: 2 days

## 2015-06-13 NOTE — Clinical Social Work Placement (Signed)
   CLINICAL SOCIAL WORK PLACEMENT  NOTE  Date:  06/13/2015  Patient Details  Name: Misty Shah MRN: [id number] Date of Birth: 09-19-1915  Clinical Social Work is seeking post-discharge placement for this patient at the Skilled  Nursing Facility level of care (*CSW will initial, date and re-position this form in  chart as items are completed):  Yes   Patient/family provided with [facility] Clinical Social Work Department's list of facilities offering this level of care within the geographic area requested by the patient (or if unable, by the patient's family).  Yes   Patient/family informed of their freedom to choose among providers that offer the needed level of care, that participate in Medicare, Medicaid or managed care program needed by the patient, have an available bed and are willing to accept the patient.  Yes   Patient/family informed of [facility]'s ownership interest in [facility] and [facility], as well as of the fact that they are under no obligation to receive care at these facilities.  PASRR submitted to EDS on 06/13/15     PASRR number received on 06/13/15     Existing PASRR number confirmed on       FL2 transmitted to all facilities in geographic area requested by pt/family on 06/13/15     FL2 transmitted to all facilities within larger geographic area on       Patient informed that his/her managed care company has contracts with or will negotiate with certain facilities, including the following:            Patient/family informed of bed offers received.  Patient chooses bed at       Physician recommends and patient chooses bed at      Patient to be transferred to   on  .  Patient to be transferred to facility by       Patient family notified on   of transfer.  Name of family member notified:        PHYSICIAN       Additional Comment:    _______________________________________________ Lebron QuamSimpson, Willella Harding Amanda, LCSW 06/13/2015, 2:02 PM

## 2015-06-13 NOTE — Evaluation (Signed)
Physical Therapy Evaluation Patient Details Name: Misty Shah MRN: [id number] DOB: [birth year] Today's Date: 06/13/2015   History of Present Illness  Admitted post fall resulting in L inferior and superior pubic rami; Had been trying to manage at son's home for a few days PTA as inclement weather kept her from the doctor's office;  has a past medical history of Hypertension; Hypothyroidism; UTI (lower urinary tract infection); Anxiety; Claustrophobia; Dislocated shoulder; Shingles; Fracture of ramus of left pubis (HCC) (05/2015); Macular degeneration of both eyes; and Arthritis.;  has past surgical history that includes Hip fracture surgery (Right, 10/2001);   Femur IM nail (09/25/2011)  Clinical Impression   Pt admitted with above diagnosis. Pt currently with functional limitations due to the deficits listed below (see PT Problem List).  Pt will benefit from skilled PT to increase their independence and safety with mobility to allow discharge to the venue listed below.       Follow Up Recommendations SNF    Equipment Recommendations  Rolling walker with 5" wheels;3in1 (PT)    Recommendations for Other Services       Precautions / Restrictions Precautions Precautions: Fall Restrictions Weight Bearing Restrictions: Yes RLE Weight Bearing: Weight bearing as tolerated LLE Weight Bearing: Partial weight bearing LLE Partial Weight Bearing Percentage or Pounds: 50      Mobility  Bed Mobility                  Transfers Overall transfer level: Needs assistance Equipment used: Rolling walker (2 wheeled) Transfers: Sit to/from Stand Sit to Stand: Mod assist         General transfer comment: Mod assist to power up; cues for hand placement and safety  Ambulation/Gait Ambulation/Gait assistance: Min assist;+2 safety/equipment (chair push) Ambulation Distance (Feet): 15 Feet Assistive device: Rolling walker (2 wheeled) Gait Pattern/deviations: Step-through pattern;Decreased  step length - right;Decreased stance time - left     General Gait Details: Cues for gait sequence and to push down into RW to unweight LLE in stance for 50% PWB  Stairs            Wheelchair Mobility    Modified Rankin (Stroke Patients Only)       Balance                                             Pertinent Vitals/Pain Pain Assessment: Faces Faces Pain Scale: Hurts even more Pain Location: L groin; pt did not rate her pain when asked; she grimaces with the anticipation of pain Pain Descriptors / Indicators: Aching;Grimacing Pain Intervention(s): Limited activity within patient's tolerance;Monitored during session;Repositioned    Home Living Family/patient expects to be discharged to:: Unsure Living Arrangements: Alone Available Help at Discharge: Family (will need more info re: available family assist) Type of Home: House Home Access: Stairs to enter Entrance Stairs-Rails: Right Entrance Stairs-Number of Steps: 2 Home Layout: Two level;Able to live on main level with bedroom/bathroom Home Equipment: Walker - 2 wheels;Grab bars - tub/shower;Hand held shower head Additional Comments: Not sure if plan will be for pt to go home, or to son's    Prior Function Level of Independence: Independent         Comments: Takes pride in her independence     Hand Dominance        Extremity/Trunk Assessment   Upper Extremity Assessment: Generalized weakness  Lower Extremity Assessment: Generalized weakness (tending to grimace with the anticipation of bilatersl hip/LE pain)         Communication   Communication: No difficulties  Cognition Arousal/Alertness: Awake/alert Behavior During Therapy: WFL for tasks assessed/performed Overall Cognitive Status: Within Functional Limits for tasks assessed                      General Comments General comments (skin integrity, edema, etc.): Pt reluctant to try to walk, but came around  with gentle encouragement    Exercises        Assessment/Plan    PT Assessment Patient needs continued PT services  PT Diagnosis Difficulty walking;Acute pain;Generalized weakness   PT Problem List Decreased strength;Decreased range of motion;Decreased activity tolerance;Decreased balance;Decreased mobility;Decreased knowledge of use of DME;Decreased safety awareness;Decreased knowledge of precautions;Pain  PT Treatment Interventions DME instruction;Gait training;Stair training;Functional mobility training;Therapeutic activities;Therapeutic exercise;Balance training;Patient/family education   PT Goals (Current goals can be found in the Care Plan section) Acute Rehab PT Goals Patient Stated Goal: did not state PT Goal Formulation: With patient Time For Goal Achievement: 06/27/15 Potential to Achieve Goals: Good    Frequency Min 3X/week   Barriers to discharge        Co-evaluation               End of Session Equipment Utilized During Treatment: Gait belt Activity Tolerance: Patient limited by pain Patient left: in chair;with call bell/phone within reach Nurse Communication: Mobility status         Time: 1030-1044 PT Time Calculation (min) (ACUTE ONLY): 14 min   Charges:   PT Evaluation $PT Eval Moderate Complexity: 1 Procedure     PT G CodesOlen Pel 06/13/2015, 11:22 AM  Van Clines, PT  Acute Rehabilitation Services Pager [phone] Office [phone]

## 2015-06-13 NOTE — Clinical Social Work Note (Signed)
Clinical Social Work Assessment  Patient Details  Name: Misty Shah MRN: [id number] Date of Birth: 1915-07-25  Date of referral:  06/13/15               Reason for consult:  Facility Placement                Permission sought to share information with:  Facility Sport and exercise psychologist, Family Supports Permission granted to share information::  Yes, Verbal Permission Granted  Name::     Chief Strategy Officer  Agency::  [facility] SN[facility]  Relationship::  son  Contact Information:     Housing/Transportation Living arrangements for the past 2 months:  [place] of Information:  Patient, Adult Children Patient Interpreter Needed:  None Criminal Activity/Legal Involvement Pertinent to Current Situation/Hospitalization:  No - Comment as needed Significant Relationships:  Adult Children Lives with:  Self Do you feel safe going back to the place where you live?  Yes Need for family participation in patient care:  Yes (Comment)  Care giving concerns:  Pt lives alone   Facilities manager / plan:  Met with Pt and son at bedside.  Pt's son stated that Pt was living alone and very independent before falling.  She then went to stay with him for approx 1 week before it was found that she had broken her pelvis after falling.  Pt and son were agreeable to SNF search and are hopeful for [facility]  Employment status:  Retired Forensic scientist:  Medicare PT Recommendations:  [place] / Referral to community resources:     Patient/Family's Response to care:  Pt and family seemed pleased with Pt's care.  Patient/Family's Understanding of and Emotional Response to Diagnosis, Current Treatment, and Prognosis:  Pt and son are in agreement with SNF and feel good and hopeful about SNF placement at [facility].  They did give SW permission to explore all of [facility].  Emotional Assessment Appearance:  Appears younger than stated  age Attitude/Demeanor/Rapport:  Apprehensive Affect (typically observed):  Accepting Orientation:  Oriented to Self, Oriented to Place, Oriented to  Time, Oriented to Situation Alcohol / Substance use:  Never Used Psych involvement (Current and /or in the community):  No (Comment)  Discharge Needs  Concerns to be addressed:  No discharge needs identified Readmission within the last 30 days:  No Current discharge risk:  None Barriers to Discharge:  No Barriers Identified   Matilde Bash, [place] 06/13/2015, 2:00 PM

## 2015-06-13 NOTE — [place] FL2 (Signed)
MEDICAID FL2 LEVEL OF CARE SCREENING TOOL     IDENTIFICATION  Patient Name: Misty Shah Birthdate: 03-Sep-1915 Sex: female Admission Date (Current Location): 05-Nov-2015  [facility] [place]   Facility and Address:  [facility], [address], [place], [place] [address]      Provider Number: [id number]  Attending Physician Name and Address:  Elease EtienneAnand D Hongalgi, MD  Relative Name and Phone Number:       Current Level of Care: Hospital Recommended Level of Care: Skilled Nursing Facility Prior Approval Number:    Date Approved/Denied:   PASRR Number: [id number] [id number]  Discharge Plan: SNF    Current Diagnoses: Patient Active Problem List   Diagnosis Date Noted  . Fracture of multiple pubic rami (HCC) 009-Jun-2017  . Dehydration 009-Jun-2017  . Intertrochanteric fracture of left hip (HCC) 09/29/2011  . Hip fracture (HCC) 09/25/2011  . Hypertension 09/25/2011  . Osteoporosis 09/25/2011  . Hypothyroidism 09/25/2011  . UTI (lower urinary tract infection) 09/25/2011    Orientation RESPIRATION BLADDER Height & Weight    Self, Time, Situation  Normal Continent      BEHAVIORAL SYMPTOMS/MOOD NEUROLOGICAL BOWEL NUTRITION STATUS      Continent  (heart healthy)  AMBULATORY STATUS COMMUNICATION OF NEEDS Skin   Limited Assist Verbally Normal                       Personal Care Assistance Level of Assistance  Bathing, Dressing Bathing Assistance: Limited assistance   Dressing Assistance: Limited assistance     Functional Limitations Info             SPECIAL CARE FACTORS FREQUENCY                       Contractures      Additional Factors Info  Allergies               Current Medications (06/13/2015):  This is the current hospital active medication list Current Facility-Administered Medications  Medication Dose Route Frequency Provider Last Rate Last Dose  . aspirin EC tablet 81 mg  81 mg Oral Daily  Catarina Hartshornavid Tat, MD   81 mg at 06/13/15 0813  . diazepam (VALIUM) tablet 2 mg  2 mg Oral Q6H PRN Catarina Hartshornavid Tat, MD   2 mg at 06/12/15 2015  . enoxaparin (LOVENOX) injection 30 mg  30 mg Subcutaneous Q24H Herby AbrahamMichelle T Bell, RPH   30 mg at 06/13/15 1150  . feeding supplement (ENSURE ENLIVE) (ENSURE ENLIVE) liquid 237 mL  237 mL Oral Q1500 Marinell BlightStephanie L Craig, RD   237 mL at 06/12/15 1600  . levothyroxine (SYNTHROID, LEVOTHROID) tablet 100 mcg  100 mcg Oral QAC breakfast Catarina Hartshornavid Tat, MD   100 mcg at 06/13/15 0814  . metoprolol tartrate (LOPRESSOR) tablet 12.5 mg  12.5 mg Oral BID Catarina Hartshornavid Tat, MD   12.5 mg at 06/13/15 0813  . morphine 2 MG/ML injection 0.5 mg  0.5 mg Intravenous Q2H PRN Catarina Hartshornavid Tat, MD      . sodium chloride 0.9 % 1,000 mL infusion   Intravenous Continuous Catarina Hartshornavid Tat, MD 50 mL/hr at 09/22/2015 2155    . traMADol (ULTRAM) tablet 50 mg  50 mg Oral Q6H PRN Catarina Hartshornavid Tat, MD   50 mg at 06/12/15 2015     Discharge Medications: Please see discharge summary for a list of discharge medications.  Relevant Imaging Results:  Relevant Lab Results:  Additional Information    Lodema Hong Raynelle [place], LCSW

## 2015-06-14 DIAGNOSIS — N3000 Acute cystitis without hematuria: Secondary | ICD-10-CM

## 2015-06-14 LAB — URINE CULTURE: Culture: >=100000

## 2015-06-14 MED ORDER — CEFUROXIME AXETIL 250 MG PO TABS
250.0000 mg | ORAL_TABLET | Freq: Two times a day (BID) | ORAL | Status: DC
  Administered 2015-06-14 – 2015-06-18 (×9): 250 mg via ORAL
  Filled 2015-06-14 (×12): qty 1

## 2015-06-14 NOTE — Progress Notes (Signed)
Utilization review completed. Fianna Snowball, RN, BSN. 

## 2015-06-14 NOTE — Progress Notes (Signed)
PROGRESS NOTE    Misty Shah WUJ:[id number] DOB: 1916/05/05 DOA: June 24, 2015 PCP: Michiel Sites, MD  HPI/Brief narrative 82 year old female, lives alone, ambulates with the help of a walker, history of HTN, hypothyroid, anxiety, presented to [facility] ED on 06-24-15 following a mechanical fall at home when she tried to open her refrigerator door that felt stuck. She sustained this fall on 06/02/15 but was unable to see her physician until June 24, 2015 and was "roughing it out" with the help of her son. X-ray of the hip confirmed a displaced left superior and inferior pubic rami fractures. Orthopedics consulted and recommended nonoperative management. PT evaluated and recommended SNF. Social worker consulted.  Assessment/Plan:   Left hip superior and inferior pubic rami fracture, displaced - Orthopedic follow-up appreciated. Recommends partial weightbearing on the left, pain management, PT and OT evaluation and possible DC to SNF. - PT recommends SNF. Per orthopedics, tramadol for pain and Lovenox for DVT prophylaxis.  Klebsiella pneumoniae UTI - Confirmed on urine culture. Patient denies dysuria. Complete 3 days of oral Ceftin.  Essential hypertension - Amlodipine held. On reduced dose of metoprolol. Reasonably controlled.  Dehydration - Resolved after IV fluids. DC IV fluids. Resumed diet.  Hypothyroid - Continue Synthroid.  Anxiety - When necessary Valium  RBBB - Not new.  Anemia - Stable    DVT prophylaxis: Lovenox  Code Status: Full Family Communication: Discussed with son at bedside on 1/15. None at bedside today. Disposition Plan: DC to SNF when bed available.   Consultants:  Orthopedics  Procedures:  Foley- DC'ed 1/15  Antimicrobials:  None   Subjective: Patient states that her left hip pain is better.  Objective: Filed Vitals:   06/13/15 2100 06/14/15 0545 06/14/15 1119 06/14/15 1429  BP: 139/64 161/67 118/56 132/56  Pulse: 95 93 77 86  Temp: 98.6 F  (37 C) 98.1 F (36.7 C) 98.6 F (37 C) 99.3 F (37.4 C)  TempSrc: Oral  Oral Oral  Resp: 16 19 19 19   Height:      Weight:      SpO2: 93% 94% 92% 93%    Intake/Output Summary (Last 24 hours) at 06/14/15 1647 Last data filed at 06/14/15 1430  Gross per 24 hour  Intake 3952.17 ml  Output    275 ml  Net 3677.17 ml   Filed Weights   Jun 24, 2015 2145  Weight: 51.5 kg (113 lb 8.6 oz)    Exam:  General exam: Pleasant elderly frail female patient lying comfortably in bed. Respiratory system: Clear. No increased work of breathing. Cardiovascular system: S1 & S2 heard, RRR. No JVD, murmurs, gallops, clicks or pedal edema. Gastrointestinal system: Abdomen is nondistended, soft and nontender. Normal bowel sounds heard. Central nervous system: Alert and oriented. No focal neurological deficits. Extremities: Symmetric 5 x 5 power. Minimal bruising over left hip site.   Data Reviewed: Basic Metabolic Panel:  Recent Labs Lab June 24, 2015 1743 06/12/15 0442  NA 139 139  K 4.3 4.0  CL 97* 101  CO2 33* 26  GLUCOSE 117* 111*  BUN 20 14  CREATININE 0.95 0.84  CALCIUM 9.6 9.3   Liver Function Tests: No results for input(s): AST, ALT, ALKPHOS, BILITOT, PROT, ALBUMIN in the last 168 hours. No results for input(s): LIPASE, AMYLASE in the last 168 hours. No results for input(s): AMMONIA in the last 168 hours. CBC:  Recent Labs Lab Jun 24, 2015 1743 06/12/15 0442  WBC 9.2 10.2  NEUTROABS 5.9  --   HGB 10.1* 10.5*  HCT 32.0* 32.6*  MCV 93.3 94.5  PLT 289 285   Cardiac Enzymes: No results for input(s): CKTOTAL, CKMB, CKMBINDEX, TROPONINI in the last 168 hours. BNP (last 3 results) No results for input(s): PROBNP in the last 8760 hours. CBG: No results for input(s): GLUCAP in the last 168 hours.  Recent Results (from the past 240 hour(s))  Culture, Urine     Status: None   Collection Time: June 08, 2015  8:12 PM  Result Value Ref Range Status   Specimen Description URINE, RANDOM   Final   Special Requests NONE  Final   Culture >=100,000 COLONIES/mL KLEBSIELLA PNEUMONIAE  Final   Report Status 06/14/2015 FINAL  Final   Organism ID, Bacteria KLEBSIELLA PNEUMONIAE  Final      Susceptibility   Klebsiella pneumoniae - MIC*    AMPICILLIN 16 RESISTANT Resistant     CEFAZOLIN <=4 SENSITIVE Sensitive     CEFTRIAXONE <=1 SENSITIVE Sensitive     CIPROFLOXACIN <=0.25 SENSITIVE Sensitive     GENTAMICIN <=1 SENSITIVE Sensitive     IMIPENEM <=0.25 SENSITIVE Sensitive     NITROFURANTOIN 32 SENSITIVE Sensitive     TRIMETH/SULFA <=20 SENSITIVE Sensitive     AMPICILLIN/SULBACTAM <=2 SENSITIVE Sensitive     PIP/TAZO <=4 SENSITIVE Sensitive     * >=100,000 COLONIES/mL KLEBSIELLA PNEUMONIAE         Studies: No results found.      Scheduled Meds: . aspirin EC  81 mg Oral Daily  . cefUROXime  250 mg Oral BID WC  . enoxaparin (LOVENOX) injection  30 mg Subcutaneous Q24H  . feeding supplement (ENSURE ENLIVE)  237 mL Oral Q1500  . levothyroxine  100 mcg Oral QAC breakfast  . metoprolol  12.5 mg Oral BID   Continuous Infusions: . sodium chloride 0.9 % 1,000 mL infusion 50 mL/hr at June 08, 2015 2155    Active Problems:   Hypertension   Hypothyroidism   Fracture of multiple pubic rami (HCC)   Dehydration    Time spent: 20 minutes    Vikrant Pryce, MD, FACP, FHM. [facility] Pager [phone]  If 7PM-7AM, please contact night-coverage [email or web address] Password [email or web address] 06/14/2015, 4:47 PM    LOS: 3 days

## 2015-06-14 NOTE — Care Management Important Message (Signed)
Important Message  Patient Details  Name: Misty Shah MRN: [id number] Date of Birth: 06-15-1915   Medicare Important Message Given:  Yes    [name] [name] 06/14/2015, 4:22 PM

## 2015-06-15 NOTE — Progress Notes (Signed)
PROGRESS NOTE    Misty Shah:[id number] DOB: 11-26-1915 DOA: June 23, 2015 PCP: Michiel Sites, MD  HPI/Brief narrative 82 year old female, lives alone, ambulates with the help of a walker, history of HTN, hypothyroid, anxiety, presented to [facility] ED on 06/18/2015 following a mechanical fall at home when she tried to open her refrigerator door that felt stuck. She sustained this fall on 06/02/15 but was unable to see her physician until 06/24/2015 and was "roughing it out" with the help of her son. X-ray of the hip confirmed a displaced left superior and inferior pubic rami fractures. Orthopedics consulted and recommended nonoperative management. PT evaluated and recommended SNF. Social worker consulted and awaiting SNF bed availability.  Assessment/Plan:   Left hip superior and inferior pubic rami fracture, displaced - Orthopedic follow-up appreciated. Recommends partial weightbearing on the left, pain management, PT and OT evaluation and possible DC to SNF. - PT recommends SNF. Per orthopedics, tramadol for pain and Lovenox for DVT prophylaxis.  Klebsiella pneumoniae UTI - Confirmed on urine culture. Patient denies dysuria. Complete 3 days of oral Ceftin.  Essential hypertension - Amlodipine held. On reduced dose of metoprolol. Reasonably controlled.  Dehydration - Resolved after IV fluids. DC IV fluids. Resumed diet.  Hypothyroid - Continue Synthroid.  Anxiety - When necessary Valium  RBBB - Not new.  Anemia - Stable    DVT prophylaxis: Lovenox  Code Status: Full Family Communication: None at bedside today. Disposition Plan: DC to SNF when bed available.   Consultants:  Orthopedics  Procedures:  Foley- DC'ed 1/15  Antimicrobials:  None   Subjective: Patient states that her left hip pain continues to improve.  Objective: Filed Vitals:   06/14/15 1429 06/14/15 2203 06/14/15 2220 06/15/15 0520  BP: 132/56 132/56 125/53 138/73  Pulse: 86 86 92 88  Temp:  99.3 F (37.4 C)  100.1 F (37.8 C) 98.5 F (36.9 C)  TempSrc: Oral  Oral Oral  Resp: Height:      Weight:      SpO2: 93%  91% 93%    Intake/Output Summary (Last 24 hours) at 06/15/15 1404 Last data filed at 06/15/15 0500  Gross per 24 hour  Intake    360 ml  Output    300 ml  Net     60 ml   Filed Weights   06/06/2015 2145  Weight: 51.5 kg (113 lb 8.6 oz)    Exam:  General exam: Pleasant elderly frail female patient lying comfortably in bed. Respiratory system: Clear. No increased work of breathing. Cardiovascular system: S1 & S2 heard, RRR. No JVD, murmurs, gallops, clicks or pedal edema. Gastrointestinal system: Abdomen is nondistended, soft and nontender. Normal bowel sounds heard. Central nervous system: Alert and oriented. No focal neurological deficits. Extremities: Symmetric 5 x 5 power. Minimal bruising over left hip & left groin site.   Data Reviewed: Basic Metabolic Panel:  Recent Labs Lab June 23, 2015 1743 06/12/15 0442  NA 139 139  K 4.3 4.0  CL 97* 101  CO2 33* 26  GLUCOSE 117* 111*  BUN 20 14  CREATININE 0.95 0.84  CALCIUM 9.6 9.3   Liver Function Tests: No results for input(s): AST, ALT, ALKPHOS, BILITOT, PROT, ALBUMIN in the last 168 hours. No results for input(s): LIPASE, AMYLASE in the last 168 hours. No results for input(s): AMMONIA in the last 168 hours. CBC:  Recent Labs Lab 06/22/2015 1743 06/12/15 0442  WBC 9.2 10.2  NEUTROABS 5.9  --   HGB 10.1*  10.5*  HCT 32.0* 32.6*  MCV 93.3 94.5  PLT 289 285   Cardiac Enzymes: No results for input(s): CKTOTAL, CKMB, CKMBINDEX, TROPONINI in the last 168 hours. BNP (last 3 results) No results for input(s): PROBNP in the last 8760 hours. CBG: No results for input(s): GLUCAP in the last 168 hours.  Recent Results (from the past 240 hour(s))  Culture, Urine     Status: None   Collection Time: 16-Jun-2015  8:12 PM  Result Value Ref Range Status   Specimen Description URINE, RANDOM   Final   Special Requests NONE  Final   Culture >=100,000 COLONIES/mL KLEBSIELLA PNEUMONIAE  Final   Report Status 06/14/2015 FINAL  Final   Organism ID, Bacteria KLEBSIELLA PNEUMONIAE  Final      Susceptibility   Klebsiella pneumoniae - MIC*    AMPICILLIN 16 RESISTANT Resistant     CEFAZOLIN <=4 SENSITIVE Sensitive     CEFTRIAXONE <=1 SENSITIVE Sensitive     CIPROFLOXACIN <=0.25 SENSITIVE Sensitive     GENTAMICIN <=1 SENSITIVE Sensitive     IMIPENEM <=0.25 SENSITIVE Sensitive     NITROFURANTOIN 32 SENSITIVE Sensitive     TRIMETH/SULFA <=20 SENSITIVE Sensitive     AMPICILLIN/SULBACTAM <=2 SENSITIVE Sensitive     PIP/TAZO <=4 SENSITIVE Sensitive     * >=100,000 COLONIES/mL KLEBSIELLA PNEUMONIAE         Studies: No results found.      Scheduled Meds: . aspirin EC  81 mg Oral Daily  . cefUROXime  250 mg Oral BID WC  . enoxaparin (LOVENOX) injection  30 mg Subcutaneous Q24H  . feeding supplement (ENSURE ENLIVE)  237 mL Oral Q1500  . levothyroxine  100 mcg Oral QAC breakfast  . metoprolol  12.5 mg Oral BID   Continuous Infusions:    Active Problems:   Hypertension   Hypothyroidism   Fracture of multiple pubic rami (HCC)   Dehydration    Time spent: 15 minutes    Faylene Allerton, MD, FACP, FHM. [facility] Pager [phone]  If 7PM-7AM, please contact night-coverage [email or web address] Password [email or web address] 06/15/2015, 2:04 PM    LOS: 4 days

## 2015-06-15 NOTE — Progress Notes (Signed)
Physical Therapy Treatment Patient Details Name: Misty Shah MRN: [id number] DOB: Oct 23, 1915 Today's Date: 06/15/2015    History of Present Illness Admitted post fall resulting in L inferior and superior pubic rami; Had been trying to manage at son's home for a few days PTA as inclement weather kept her from the doctor's office;  has a past medical history of Hypertension; Hypothyroidism; UTI (lower urinary tract infection); Anxiety; Claustrophobia; Dislocated shoulder; Shingles; Fracture of ramus of left pubis (HCC) (05/2015); Macular degeneration of both eyes; and Arthritis.;  has past surgical history that includes Hip fracture surgery (Right, 10/2001);   Femur IM nail (09/25/2011)    PT Comments    Patient making slow progress toward mobility goals. Continue to progress as tolerated with anticipated d/c to SNF for further skilled PT services.    Follow Up Recommendations  SNF     Equipment Recommendations  Rolling walker with 5" wheels;3in1 (PT)    Recommendations for Other Services       Precautions / Restrictions Precautions Precautions: Fall Restrictions Weight Bearing Restrictions: Yes RLE Weight Bearing: Weight bearing as tolerated LLE Weight Bearing: Partial weight bearing LLE Partial Weight Bearing Percentage or Pounds: 50    Mobility  Bed Mobility Overal bed mobility: Needs Assistance Bed Mobility: Supine to Sit     Supine to sit: Min assist;Mod assist;HOB elevated     General bed mobility comments: min A to bring bilat LE to EOB with pt able to bring LE mostly by herself; mod A to elevate trunk into sitting with HOB elevated; vc for technique   Transfers Overall transfer level: Needs assistance Equipment used: Rolling walker (2 wheeled) Transfers: Sit to/from Stand Sit to Stand: Min assist         General transfer comment: min A to power up into standing from EOB and BSC; pt with increased time to achieve erect posture; vc for maintianing WB  status  Ambulation/Gait Ambulation/Gait assistance: Min guard Ambulation Distance (Feet): 30 Feet Assistive device: Rolling walker (2 wheeled) Gait Pattern/deviations: Decreased step length - right;Decreased stance time - left     General Gait Details: vc for sequencing and maintaining PWB L LE   Stairs            Wheelchair Mobility    Modified Rankin (Stroke Patients Only)       Balance Overall balance assessment: Needs assistance Sitting-balance support: Feet supported Sitting balance-Leahy Scale: Fair     Standing balance support: Bilateral upper extremity supported Standing balance-Leahy Scale: Poor                      Cognition Arousal/Alertness: Awake/alert Behavior During Therapy: WFL for tasks assessed/performed Overall Cognitive Status: Within Functional Limits for tasks assessed                      Exercises      General Comments        Pertinent Vitals/Pain Pain Assessment: Faces Faces Pain Scale: Hurts a little bit Pain Location: L LE Pain Descriptors / Indicators: Sore Pain Intervention(s): Limited activity within patient's tolerance;Monitored during session;Repositioned    Home Living                      Prior Function            PT Goals (current goals can now be found in the care plan section) Acute Rehab PT Goals Patient Stated Goal: did not state PT  Goal Formulation: With patient Time For Goal Achievement: 06/27/15 Potential to Achieve Goals: Good Progress towards PT goals: Progressing toward goals    Frequency  Min 3X/week    PT Plan Current plan remains appropriate    Co-evaluation             End of Session Equipment Utilized During Treatment: Gait belt Activity Tolerance: Patient limited by pain Patient left: in chair;with call bell/phone within reach;with chair alarm set     Time: 1610-9604 PT Time Calculation (min) (ACUTE ONLY): 26 min  Charges:  $Gait Training: 8-22  mins $Therapeutic Activity: 8-22 mins                    G Codes:      Derek Mound, PTA Pager: [phone]   06/15/2015, 2:40 PM

## 2015-06-16 ENCOUNTER — Inpatient Hospital Stay (HOSPITAL_COMMUNITY): Payer: Medicare Other

## 2015-06-16 DIAGNOSIS — S32502D Unspecified fracture of left pubis, subsequent encounter for fracture with routine healing: Secondary | ICD-10-CM

## 2015-06-16 DIAGNOSIS — E039 Hypothyroidism, unspecified: Secondary | ICD-10-CM

## 2015-06-16 LAB — CBC
HCT: 24.7 % — ABNORMAL LOW (ref 36.0–46.0)
Hemoglobin: 8.1 g/dL — ABNORMAL LOW (ref 12.0–15.0)
MCH: 29.9 pg (ref 26.0–34.0)
MCHC: 32.8 g/dL (ref 30.0–36.0)
MCV: 91.1 fL (ref 78.0–100.0)
Platelets: 225 10*3/uL (ref 150–400)
RBC: 2.71 MIL/uL — ABNORMAL LOW (ref 3.87–5.11)
RDW: 14.1 % (ref 11.5–15.5)
WBC: 9.6 10*3/uL (ref 4.0–10.5)

## 2015-06-16 MED ORDER — DIAZEPAM 2 MG PO TABS: 2.0000 mg | ORAL_TABLET | Freq: Four times a day (QID) | ORAL | Status: DC | PRN

## 2015-06-16 MED ORDER — FUROSEMIDE 20 MG PO TABS: 20.0000 mg | ORAL_TABLET | Freq: Every day | ORAL | Status: AC

## 2015-06-16 MED ORDER — POTASSIUM CHLORIDE CRYS ER 10 MEQ PO TBCR: 10.0000 meq | EXTENDED_RELEASE_TABLET | Freq: Every day | ORAL | Status: DC

## 2015-06-16 MED ORDER — METOPROLOL TARTRATE 25 MG PO TABS: 12.5000 mg | ORAL_TABLET | Freq: Two times a day (BID) | ORAL | Status: DC

## 2015-06-16 MED ORDER — CEFUROXIME AXETIL 250 MG PO TABS: 250.0000 mg | ORAL_TABLET | Freq: Two times a day (BID) | ORAL | Status: AC

## 2015-06-16 MED ORDER — TRAMADOL HCL 50 MG PO TABS: 50.0000 mg | ORAL_TABLET | Freq: Four times a day (QID) | ORAL | Status: DC | PRN

## 2015-06-16 MED ORDER — POLYETHYLENE GLYCOL 3350 17 G PO PACK
34.0000 g | PACK | Freq: Every day | ORAL | Status: DC
  Administered 2015-06-16 – 2015-06-17 (×2): 34 g via ORAL
  Filled 2015-06-16 (×2): qty 2

## 2015-06-16 MED ORDER — ENSURE ENLIVE PO LIQD: 237.0000 mL | Freq: Every day | ORAL | Status: DC

## 2015-06-16 MED ORDER — IOHEXOL 300 MG/ML  SOLN
25.0000 mL | INTRAMUSCULAR | Status: AC
Start: 2015-06-16 — End: 2015-06-16
  Administered 2015-06-16 (×2): 25 mL via ORAL

## 2015-06-16 MED ORDER — SENNOSIDES-DOCUSATE SODIUM 8.6-50 MG PO TABS: 1.0000 | ORAL_TABLET | Freq: Two times a day (BID) | ORAL | Status: DC

## 2015-06-16 MED ORDER — ENOXAPARIN SODIUM 30 MG/0.3ML ~~LOC~~ SOLN: 30.0000 mg | SUBCUTANEOUS | Status: DC

## 2015-06-16 NOTE — Progress Notes (Signed)
Patient complaining of abdomen pain again and tender to the touch on assessment. Cancel discharge. [facility] notified.

## 2015-06-16 NOTE — Progress Notes (Signed)
CT of the Abd. result relayed to K. Wellsite geologist.

## 2015-06-16 NOTE — Clinical Social Work Placement (Signed)
   CLINICAL SOCIAL WORK PLACEMENT  NOTE  Date:  06/16/2015  Patient Details  Name: Misty Shah MRN: [id number] Date of Birth: Aug 27, 1915  Clinical Social Work is seeking post-discharge placement for this patient at the Skilled  Nursing Facility level of care (*CSW will initial, date and re-position this form in  chart as items are completed):  Yes   Patient/family provided with [place] Clinical Social Work Department's list of facilities offering this level of care within the geographic area requested by the patient (or if unable, by the patient's family).  Yes   Patient/family informed of their freedom to choose among providers that offer the needed level of care, that participate in Medicare, Medicaid or managed care program needed by the patient, have an available bed and are willing to accept the patient.  Yes   Patient/family informed of 's ownership interest in [facility] and [facility], as well as of the fact that they are under no obligation to receive care at these facilities.  PASRR submitted to EDS on 06/13/15     PASRR number received on 06/13/15     Existing PASRR number confirmed on       FL2 transmitted to all facilities in geographic area requested by pt/family on 06/13/15     FL2 transmitted to all facilities within larger geographic area on       Patient informed that his/her managed care company has contracts with or will negotiate with certain facilities, including the following:        Yes   Patient/family informed of bed offers received.  Patient chooses bed at [facility]     Physician recommends and patient chooses bed at      Patient to be transferred to [facility] on 06/16/15.  Patient to be transferred to facility by       Patient family notified on 06/16/15 of transfer.  Name of family member notified:  Emelina Hinch- Son     PHYSICIAN Please prepare priority discharge summary, including  medications, Please sign FL2, Please prepare prescriptions     Additional Comment:    _______________________________________________ Darylene Price, LCSW 06/16/2015, 3:02 PM

## 2015-06-16 NOTE — Progress Notes (Signed)
Pt transferred to: [facility] Anticipated date of transfer: 06/16/15 Transported by: Ambulance ([facility]) Time Tentatively Scheduled for: 4PM Family notified: Son:  Cybil [place]egal-  Will sign admit papers at facility at 3:30 pm Report #: [id number]  For RM 307 B DC summary sent to facility for review. Son is pleased with d/c plan;  Currently patient will be placed in a semi-private room. Wants private room when available. Patient alert and oriented some of the time- very pleasant and agreeable to d/c. No further CSW needs identified. CSW signing off.    CSW signing off.  Lovette Cliche, LCSW [phone]

## 2015-06-16 NOTE — Discharge Summary (Signed)
Misty Shah, is a 82 y.o. female  DOB 10-07-1915  MRN [id number].  Admission date:  06/22/2015  Admitting Physician  Catarina Hartshorn, MD  Discharge Date:  06/16/2015   Primary MD  Michiel Sites, MD  Recommendations for primary care physician for things to follow:  - Please check CBC, BMP in 3 days - to continue with Lovenox for next 4 weeks for DVT prophylaxis - Resumed on low-dose by mouth Lasix, 20 mg oral daily, reassess, was on 80 mg oral daily, does not appear to be in any volume overload, did not require any Lasix during hospital stay.   Admission Diagnosis  Fall against object, initial encounter [W18.09XA]   Discharge Diagnosis  Fall against object, initial encounter [W18.09XA]    Active Problems:   Hypertension   Hypothyroidism   Fracture of multiple pubic rami (HCC)   Dehydration      Past Medical History  Diagnosis Date  . Hypertension   . Hypothyroidism   . UTI (lower urinary tract infection)   . Anxiety   . Claustrophobia   . Dislocated shoulder     hx bilateral shoulders - had therapy on them  . Shingles   . Fracture of ramus of left pubis (HCC) 05/2015    displaced left superior and inferior pubic rami fractures.  . Macular degeneration of both eyes   . Arthritis     "all over"    Past Surgical History  Procedure Laterality Date  . Cholecystectomy    . Thyroidectomy    . Tonsillectomy    . Appendectomy    . Hip fracture surgery Right 10/2001    Manipulation and compression screw fixation, right hip fracture/notes 10/11/2010  . Colonoscopy    . Cataract extraction, bilateral    . Femur im nail  09/25/2011    Procedure: INTRAMEDULLARY (IM) NAIL FEMORAL;  Surgeon: Valeria Batman, MD;  Location: [facility];  Service: Orthopedics;  Laterality: Left;  left hip   . Fracture surgery    . Cataract extraction w/ intraocular lens  implant, bilateral Bilateral         History of present illness and  Hospital Course:     Kindly see H&P for history of present illness and admission details, please review complete Labs, Consult reports and Test reports for all details in brief  HPI  from the history and physical done on the day of admission 06/16/2015 82 year old female with a history of hypertension, hypothyroidism, anxiety presented with a mechanical fall that occurred on 06/02/2015 when she tried to open her refrigerator door that was stuck closed. Because of the inclement weather, the patient did not make it to see her physician until today, 06/21/2015. She has been trying to "rough it out" with the help of her son who has been helping her with transfers and getting to the bathroom. However, the patient's pain did not remit. As a result, she went to see her primary care provider. X-rays revealed a pelvic fracture. She was sent to the emergency  department for further evaluation. Patient denies fevers, chills, headache, chest pain, dyspnea, nausea, vomiting, diarrhea, abdominal pain, dysuria, hematuria, hematochezia, melena. At baseline, the patient uses a walker to ambulate. She lives at home by herself prior to her injury. In the emergency department, BMP was essentially unremarkable except for serum creatinine 0.95. CBC was unremarkable except for hemoglobin 10.1. Urinalysis showed 6-30 WBC. Chest x-ray was negative. Left hip x-ray showed displaced left superior and inferior pubic rami fractures.   Hospital Course  82 year old female, lives alone, ambulates with the help of a walker, history of HTN, hypothyroid, anxiety, presented to [facility] ED on 2015-06-22 following a mechanical fall at home when she tried to open her refrigerator door that felt stuck. She sustained this fall on 06/02/15 but was unable to see her physician until 06-22-2015 and was "roughing it out" with the help of her son. X-ray of the hip confirmed a displaced left superior and inferior pubic rami  fractures. Orthopedics consulted and recommended nonoperative management. PT evaluated and recommended SNF.   Left hip superior and inferior pubic rami fracture, displaced - Orthopedic follow-up appreciated. Recommends partial weightbearing on the left, pain management, and by PT, recommend SNF placement.  Klebsiella pneumoniae UTI - Confirmed on urine culture. Patient denies dysuria. Complete 3 days of oral Ceftin, last dose this evening.  Essential hypertension - Amlodipine held. On reduced dose of metoprolol. Reasonably controlled. Will discharge on low dose metoprolol, and no amlodipine on discharge  Dehydration - Resolved after IV fluids.   Hypothyroid - Continue Synthroid.  Anxiety - When necessary Valium  RBBB - Not new.  Anemia - Stable   Discharge Condition:  Stable   Follow UP  Follow-up Information    Follow up with Cammy Copa, MD In 3 weeks.   Specialty:  Orthopedic Surgery   Contact information:   [address] [place] [address] [phone]       Follow up with [facility] .   Specialty:  Skilled Nursing Facility   Contact information:   [address] [place] [address] [phone]        Discharge Instructions  and  Discharge Medications     Discharge Instructions    Diet - low sodium heart healthy    Complete by:  As directed      Discharge instructions    Complete by:  As directed      Increase activity slowly    Complete by:  As directed      Weight bearing as tolerated    Complete by:  As directed             Medication List    STOP taking these medications        amLODipine 5 MG tablet  Commonly known as:  NORVASC     diclofenac sodium 1 % Gel  Commonly known as:  VOLTAREN     HYDROcodone-acetaminophen 5-325 MG tablet  Commonly known as:  NORCO/VICODIN     SYSTANE FREE OP      TAKE these medications        aspirin EC 81 MG tablet  Take 81 mg by  mouth daily.     cefUROXime 250 MG tablet  Commonly known as:  CEFTIN  Take 1 tablet (250 mg total) by mouth 2 (two) times daily with a meal. Please take 1 tablet at 6 PM this evening then stop.     diazepam 2 MG tablet  Commonly known as:  VALIUM  Take 1 tablet (2 mg total) by mouth every 6 (six) hours as needed for anxiety.     enoxaparin 30 MG/0.3ML injection  Commonly known as:  LOVENOX  Inject 0.3 mLs (30 mg total) into the skin daily. This take total of 4 weeks then stop     feeding supplement (ENSURE ENLIVE) Liqd  Take 237 mLs by mouth daily at 3 pm.     furosemide 20 MG tablet  Commonly known as:  LASIX  Take 1 tablet (20 mg total) by mouth daily.     levothyroxine 100 MCG tablet  Commonly known as:  SYNTHROID, LEVOTHROID  Take 100 mcg by mouth daily.     metoprolol tartrate 25 MG tablet  Commonly known as:  LOPRESSOR  Take 0.5 tablets (12.5 mg total) by mouth 2 (two) times daily.     potassium chloride 10 MEQ tablet  Commonly known as:  K-DUR,KLOR-CON  Take 1 tablet (10 mEq total) by mouth daily.     senna-docusate 8.6-50 MG tablet  Commonly known as:  Senokot-S  Take 1 tablet by mouth 2 (two) times daily.     traMADol 50 MG tablet  Commonly known as:  ULTRAM  Take 1 tablet (50 mg total) by mouth every 6 (six) hours as needed. For pain relief          Diet and Activity recommendation: See Discharge Instructions above   Consults obtained -  Orthopedics   Major procedures and Radiology Reports - PLEASE review detailed and final reports for all details, in brief -    Dg Chest 1 View  06/01/2015  CLINICAL DATA:  Fall. EXAM: CHEST 1 VIEW COMPARISON:  June 01, 2015. FINDINGS: Stable cardiomediastinal silhouette. Moderate dextroscoliosis of thoracic spine is noted. No pneumothorax or pleural effusion is noted. Degenerative change of right glenohumeral joint is noted. No acute pulmonary disease is noted. IMPRESSION: No acute cardiopulmonary abnormality  seen. Electronically Signed   By: Lupita Raider, M.D.   On: 06/17/2015 18:25   Dg Chest 1 View  06/01/2015  CLINICAL DATA:  Shortness of breath last night and today. EXAM: CHEST 1 VIEW COMPARISON:  10/10/2011 FINDINGS: There is hyperinflation of the lungs compatible with COPD. Cardiomegaly. Diffuse interstitial opacities throughout the lungs, most likely edema. Bibasilar opacities likely reflect atelectasis. Small effusions. IMPRESSION: Suspect mild CHF.  Bibasilar atelectasis with small effusions. Electronically Signed   By: Charlett Nose M.D.   On: 06/01/2015 14:58   Dg Abd Portable 1v  06/16/2015  CLINICAL DATA:  Generalized abdominal pain. EXAM: PORTABLE ABDOMEN - 1 VIEW COMPARISON:  None. FINDINGS: The bowel gas pattern is normal. No radio-opaque calculi are seen. Surgical sutures are seen in the right side of the abdomen. Postsurgical changes are seen involving both proximal femurs. IMPRESSION: No evidence of bowel obstruction or ileus. Electronically Signed   By: Lupita Raider, M.D.   On: 06/16/2015 09:24   Dg Hip Unilat With Pelvis 2-3 Views Left  06/03/2015  CLINICAL DATA:  Left hip pain after fall today. EXAM: DG HIP (WITH OR WITHOUT PELVIS) 2-3V LEFT COMPARISON:  September 25, 2011. FINDINGS: Status post surgical internal fixation of both proximal femurs. The are noted displaced fractures involving the left superior and inferior pubic rami. No acute abnormality seen involving either hip joint or proximal femur. Joint spaces appear to be intact. Diffuse osteopenia is noted. IMPRESSION: Status post surgical internal fixation of both proximal femurs. Displaced left superior and inferior pubic rami fractures are  noted. Electronically Signed   By: Lupita Raider, M.D.   On: 06/03/2015 18:24    Micro Results     Recent Results (from the past 240 hour(s))  Culture, Urine     Status: None   Collection Time: 06/24/2015  8:12 PM  Result Value Ref Range Status   Specimen Description URINE, RANDOM   Final   Special Requests NONE  Final   Culture >=100,000 COLONIES/mL KLEBSIELLA PNEUMONIAE  Final   Report Status 06/14/2015 FINAL  Final   Organism ID, Bacteria KLEBSIELLA PNEUMONIAE  Final      Susceptibility   Klebsiella pneumoniae - MIC*    AMPICILLIN 16 RESISTANT Resistant     CEFAZOLIN <=4 SENSITIVE Sensitive     CEFTRIAXONE <=1 SENSITIVE Sensitive     CIPROFLOXACIN <=0.25 SENSITIVE Sensitive     GENTAMICIN <=1 SENSITIVE Sensitive     IMIPENEM <=0.25 SENSITIVE Sensitive     NITROFURANTOIN 32 SENSITIVE Sensitive     TRIMETH/SULFA <=20 SENSITIVE Sensitive     AMPICILLIN/SULBACTAM <=2 SENSITIVE Sensitive     PIP/TAZO <=4 SENSITIVE Sensitive     * >=100,000 COLONIES/mL KLEBSIELLA PNEUMONIAE       Today   Subjective:   Yeilin Zweber today has no headache,no chest pain,no new weakness tingling or numbness, complaints of occasional abdominal pain overnight.  Objective:   Blood pressure 129/46, pulse 96, temperature 98.7 F (37.1 C), temperature source Oral, resp. rate 18, height 5' (1.524 m), weight 51.5 kg (113 lb 8.6 oz), SpO2 97 %.   Intake/Output Summary (Last 24 hours) at 06/16/15 1444 Last data filed at 06/16/15 1405  Gross per 24 hour  Intake    120 ml  Output      1 ml  Net    119 ml    Exam General exam: Pleasant elderly frail female patient lying comfortably in bed. Respiratory system: Clear. No increased work of breathing. Cardiovascular system: S1 & S2 heard, RRR. No JVD, murmurs, gallops, clicks or pedal edema. Gastrointestinal system: Abdomen is nondistended, soft and nontender. Normal bowel sounds heard. Central nervous system: Alert and oriented. No focal neurological deficits. Extremities: Symmetric 5 x 5 power. Minimal bruising over left hip & left groin site.  Data Review   CBC w Diff: Lab Results  Component Value Date   WBC 10.2 06/12/2015   HGB 10.5* 06/12/2015   HCT 32.6* 06/12/2015   PLT 285 06/12/2015   LYMPHOPCT 16 06/24/2015   MONOPCT  12 06/20/2015   EOSPCT 7 06/28/2015   BASOPCT 1 06/13/2015    CMP: Lab Results  Component Value Date   NA 139 06/12/2015   K 4.0 06/12/2015   CL 101 06/12/2015   CO2 26 06/12/2015   BUN 14 06/12/2015   CREATININE 0.84 06/12/2015   PROT 5.3* 09/29/2011   ALBUMIN 2.3* 09/29/2011   BILITOT 0.6 09/29/2011   ALKPHOS 49 09/29/2011   AST 21 09/29/2011   ALT 9 09/29/2011  .   Total Time in preparing paper work, data evaluation and todays exam - 35 minutes  Allyna Pittsley M.D on 06/16/2015 at 2:44 PM  [facility]   Office  [phone]

## 2015-06-16 NOTE — Progress Notes (Signed)
Report called to [facility].  °

## 2015-06-17 LAB — BASIC METABOLIC PANEL
Anion gap: 10 (ref 5–15)
Anion gap: 7 (ref 5–15)
BUN: 13 mg/dL (ref 6–20)
BUN: 15 mg/dL (ref 6–20)
CO2: 25 mmol/L (ref 22–32)
CO2: 24 mmol/L (ref 22–32)
Calcium: 8.4 mg/dL — ABNORMAL LOW (ref 8.9–10.3)
Calcium: 8 mg/dL — ABNORMAL LOW (ref 8.9–10.3)
Chloride: 94 mmol/L — ABNORMAL LOW (ref 101–111)
Chloride: 100 mmol/L — ABNORMAL LOW (ref 101–111)
Creatinine, Ser: 0.97 mg/dL (ref 0.44–1.00)
Creatinine, Ser: 0.88 mg/dL (ref 0.44–1.00)
GFR calc Af Amer: 55 mL/min — ABNORMAL LOW (ref >60)
GFR calc Af Amer: >60 mL/min (ref >60)
GFR calc non Af Amer: 47 mL/min — ABNORMAL LOW (ref >60)
GFR calc non Af Amer: 53 mL/min — ABNORMAL LOW (ref >60)
Glucose, Bld: 152 mg/dL — ABNORMAL HIGH (ref 65–99)
Glucose, Bld: 115 mg/dL — ABNORMAL HIGH (ref 65–99)
Potassium: 4.3 mmol/L (ref 3.5–5.1)
Potassium: 4.5 mmol/L (ref 3.5–5.1)
Sodium: 129 mmol/L — ABNORMAL LOW (ref 135–145)
Sodium: 131 mmol/L — ABNORMAL LOW (ref 135–145)

## 2015-06-17 LAB — CBC
HEMATOCRIT: 22.5 % — AB (ref 36.0–46.0)
HEMOGLOBIN: 7.3 g/dL — AB (ref 12.0–15.0)
MCH: 30 pg (ref 26.0–34.0)
MCHC: 32.4 g/dL (ref 30.0–36.0)
MCV: 92.6 fL (ref 78.0–100.0)
Platelets: 211 10*3/uL (ref 150–400)
RBC: 2.43 MIL/uL — ABNORMAL LOW (ref 3.87–5.11)
RDW: 14.3 % (ref 11.5–15.5)
WBC: 6.5 10*3/uL (ref 4.0–10.5)

## 2015-06-17 LAB — PREPARE RBC (CROSSMATCH)

## 2015-06-17 MED ORDER — SODIUM CHLORIDE 0.9 % IV BOLUS (SEPSIS)
250.0000 mL | Freq: Once | INTRAVENOUS | Status: AC
  Administered 2015-06-17: 250 mL via INTRAVENOUS

## 2015-06-17 MED ORDER — MAGNESIUM CITRATE PO SOLN
0.5000 | Freq: Once | ORAL | Status: AC
  Administered 2015-06-17: 0.5 via ORAL
  Filled 2015-06-17: qty 296

## 2015-06-17 MED ORDER — BISACODYL 5 MG PO TBEC: 10.0000 mg | DELAYED_RELEASE_TABLET | Freq: Every day | ORAL | Status: DC | PRN

## 2015-06-17 MED ORDER — BOOST / RESOURCE BREEZE PO LIQD
1.0000 | ORAL | Status: DC
  Administered 2015-06-17 – 2015-06-20 (×3): 1 via ORAL

## 2015-06-17 MED ORDER — BISACODYL 10 MG RE SUPP
10.0000 mg | Freq: Once | RECTAL | Status: AC
  Administered 2015-06-17: 10 mg via RECTAL
  Filled 2015-06-17: qty 1

## 2015-06-17 MED ORDER — SODIUM CHLORIDE 0.9 % IV SOLN: Freq: Once | INTRAVENOUS | Status: DC

## 2015-06-17 MED ORDER — SENNOSIDES-DOCUSATE SODIUM 8.6-50 MG PO TABS
2.0000 | ORAL_TABLET | Freq: Two times a day (BID) | ORAL | Status: DC
  Administered 2015-06-17: 2 via ORAL
  Filled 2015-06-17: qty 2

## 2015-06-17 MED ORDER — SODIUM CHLORIDE 0.9 % IV SOLN
INTRAVENOUS | Status: DC
  Administered 2015-06-17 – 2015-06-19 (×4): via INTRAVENOUS

## 2015-06-17 NOTE — Progress Notes (Signed)
Physical Therapy Treatment Patient Details Name: KATHRYNNE KULINSKI MRN: [id number] DOB: May 20, 1916 Today's Date: 06/17/2015    History of Present Illness Admitted post fall resulting in L inferior and superior pubic rami; Had been trying to manage at son's home for a few days PTA as inclement weather kept her from the doctor's office;  has a past medical history of Hypertension; Hypothyroidism; UTI (lower urinary tract infection); Anxiety; Claustrophobia; Dislocated shoulder; Shingles; Fracture of ramus of left pubis (HCC) (05/2015); Macular degeneration of both eyes; and Arthritis.;  has past surgical history that includes Hip fracture surgery (Right, 10/2001);   Femur IM nail (09/25/2011)    PT Comments    Patient making slow progress with mobility and gait.  Difficulty maintaining PWB on LLE and with decreased balance today.  Agree with need for SNF at discharge.  Follow Up Recommendations  SNF     Equipment Recommendations  Rolling walker with 5" wheels;3in1 (PT)    Recommendations for Other Services       Precautions / Restrictions Precautions Precautions: Fall Restrictions Weight Bearing Restrictions: Yes RLE Weight Bearing: Weight bearing as tolerated LLE Weight Bearing: Partial weight bearing LLE Partial Weight Bearing Percentage or Pounds: 50    Mobility  Bed Mobility Overal bed mobility: Needs Assistance Bed Mobility: Supine to Sit;Sit to Supine     Supine to sit: Mod assist Sit to supine: Mod assist   General bed mobility comments: Verbal cues for technique.  Assist to bring LE's off of bed and to raise trunk to sitting position.  Use of bed pad to assist patient to scoot to EOB in sitting.  Assist to bring LE's onto bed to return to supine, and to reposition.  Transfers Overall transfer level: Needs assistance Equipment used: Rolling walker (2 wheeled) Transfers: Sit to/from Stand Sit to Stand: Min assist         General transfer comment: Verbal cues for hand  placement.  Assist to power up to standing, with cues for PWB on LLE.  Ambulation/Gait Ambulation/Gait assistance: Min assist Ambulation Distance (Feet): 30 Feet Assistive device: Rolling walker (2 wheeled) Gait Pattern/deviations: Step-through pattern;Decreased stance time - left;Decreased step length - right;Decreased stride length;Antalgic     General Gait Details: Verbal cues for safe use of RW and to maintain PWB on LLE.  Patient required assist to maintain balance today due to posterior lean.   Stairs            Wheelchair Mobility    Modified Rankin (Stroke Patients Only)       Balance           Standing balance support: Bilateral upper extremity supported Standing balance-Leahy Scale: Poor                      Cognition Arousal/Alertness: Awake/alert Behavior During Therapy: WFL for tasks assessed/performed Overall Cognitive Status: Within Functional Limits for tasks assessed                      Exercises      General Comments        Pertinent Vitals/Pain Pain Assessment: Faces Faces Pain Scale: Hurts a little bit Pain Location: Lt groin Pain Descriptors / Indicators: Sore Pain Intervention(s): Limited activity within patient's tolerance;Monitored during session;Repositioned    Home Living                      Prior Function  PT Goals (current goals can now be found in the care plan section) Progress towards PT goals: Progressing toward goals    Frequency  Min 3X/week    PT Plan Current plan remains appropriate    Co-evaluation             End of Session Equipment Utilized During Treatment: Gait belt Activity Tolerance: Patient limited by pain;Patient limited by fatigue Patient left: in bed;with call bell/phone within reach;with family/visitor present     Time: 1610-9604 PT Time Calculation (min) (ACUTE ONLY): 24 min  Charges:  $Gait Training: 23-37 mins                    G Codes:       Vena Austria 07/13/2015, 5:23 PM Durenda Hurt. Renaldo Fiddler, [facility] Acute Rehab Services Pager [phone]

## 2015-06-17 NOTE — Progress Notes (Signed)
Nutrition Follow-up  DOCUMENTATION CODES:   Not applicable  INTERVENTION:   -D/c Ensure Enlive po daily, each supplement provides 350 kcal and 20 grams of protein -Boost Breeze po daily, each supplement provides 250 kcal and 9 grams of protein  NUTRITION DIAGNOSIS:   Increased nutrient needs related to  (healing) as evidenced by estimated needs.  Progressing  GOAL:   Patient will meet greater than or equal to 90% of their needs  Progressing  MONITOR:   PO intake, Supplement acceptance, Weight trends, Labs, I & O's, Skin  REASON FOR ASSESSMENT:   Consult Hip fracture protocol  ASSESSMENT:   82 year old female with a history of hypertension, hypothyroidism, anxiety presented with a mechanical fall that occurred on 06/02/2015 when she tried to open her refrigerator door that was stuck closed. Because of the inclement weather, the patient did not make it to see her physician until today, 06/08/2015. She has been trying to "rough it out" with the help of her son who has been helping her with transfers and getting to the bathroom. However, the patient's pain did not remit. As a result, she went to see her primary care provider. X-rays revealed a pelvic fracture.   Pt sleeping in bed at time of visit. She did not arouse when this RD called her name. Noted cups of coffee and juice at bedside; pt consumed about 50%.   Appetite has been variable, but generally good; PO: 25-100%. Pt has been refusing Ensure supplements. Per RN notes, pt is afraid they will make her sick. RD will d/c due to poor acceptance.   Discharge to [facility] was cancelled on 06/16/15 secondary to worsening abdominal pain. CSW following for dischage back to [facility]; awaiting medical stability.   Labs reviewed: Na: 131 (on IV supplementation).   Diet Order:  Diet Heart Room service appropriate?: Yes; Fluid consistency:: Thin Diet - low sodium heart healthy  Skin:  Reviewed, no issues  Last BM:   06/13/15  Height:   Ht Readings from Last 1 Encounters:  06/27/2015 5' (1.524 m)    Weight:   Wt Readings from Last 1 Encounters:  06/29/2015 113 lb 8.6 oz (51.5 kg)    Ideal Body Weight:  45.45 kg  BMI:  Body mass index is 22.17 kg/(m^2).  Estimated Nutritional Needs:   Kcal:  1350-1550  Protein:  55-70 grams  Fluid:  >/= 1.5 L/day  EDUCATION NEEDS:   No education needs identified at this time  Jereline Ticer A. Mayford Knife, RD, LDN, CDE Pager: [phone] After hours Pager: [phone]

## 2015-06-17 NOTE — Progress Notes (Signed)
PROGRESS NOTE    Misty Shah ZOX:[id number] DOB: Oct 14, 1915 DOA: 06/21/2015 PCP: Michiel Sites, MD  HPI/Brief narrative 82 year old female, lives alone, ambulates with the help of a walker, history of HTN, hypothyroid, anxiety, presented to [facility] ED on 06/06/2015 following a mechanical fall at home when she tried to open her refrigerator door that felt stuck. She sustained this fall on 06/02/15 but was unable to see her physician until 06/24/2015 and was "roughing it out" with the help of her son. X-ray of the hip confirmed a displaced left superior and inferior pubic rami fractures. Orthopedics consulted and recommended nonoperative management. PT evaluated and recommended SNF. Social worker consulted and awaiting SNF bed availability, prior to discharge patient had complaints of abdominal pain, CT abdomen pelvis significant for Large acute/subacute hematoma in the prevesical space subsequent to recently demonstrated displaced fractures of the left pubic rami  Assessment/Plan:   Left hip superior and inferior pubic rami fracture, displaced - Orthopedic follow-up appreciated. Recommends partial weightbearing on the left, pain management, PT and OT evaluation and possible DC to SNF. - PT recommends SNF. Per orthopedics  Anemia secondary to blood loss from hematoma at left pubic ramus fracture - Patient is lying hemoglobin 12.8 as an outpatient, was 10.1 on admission, 7.3 today, transfused 2 units PRBC today, recheck CBC in a.m., continue to hold DVT prophylaxis and aspirin.  Klebsiella pneumoniae UTI - Confirmed on urine culture. Patient denies dysuria. Complete 3 days of oral Ceftin.  Essential hypertension - Amlodipine held. On reduced dose of metoprolol. Reasonably controlled.  Dehydration - Resolved after IV fluids. DC IV fluids. Resumed diet.  Hypothyroid - Continue Synthroid.  Anxiety - When necessary Valium  RBBB - Not new.  Anemia - Stable    DVT prophylaxis: Lovenox   Code Status: Full Family Communication: None at bedside today. Disposition Plan: DC to SNF in 48 hours.   Consultants:  Orthopedics  Procedures:  Foley- DC'ed 1/15  Antimicrobials:  None   Subjective: Patient states that her left hip pain continues to improve.  Objective: Filed Vitals:   06/17/15 1143 06/17/15 1401 06/17/15 1539 06/17/15 1610  BP: 121/45 140/79 143/59 138/58  Pulse: 81 88 97 95  Temp: 99 F (37.2 C) 98.4 F (36.9 C) 99.7 F (37.6 C) 100.2 F (37.9 C)  TempSrc: Oral Oral Oral Oral  Resp: Height:      Weight:      SpO2: 98% 98% 92% 92%    Intake/Output Summary (Last 24 hours) at 06/17/15 1637 Last data filed at 06/17/15 1400  Gross per 24 hour  Intake   1331 ml  Output    226 ml  Net   1105 ml   Filed Weights   06/22/2015 2145  Weight: 51.5 kg (113 lb 8.6 oz)    Exam:  General exam: Pleasant elderly frail female patient lying comfortably in bed. Respiratory system: Clear. No increased work of breathing. Cardiovascular system: S1 & S2 heard, RRR. No JVD, murmurs, gallops, clicks or pedal edema. Gastrointestinal system: Abdomen is nondistended, soft , minimal diffuse tenderness. Normal bowel sounds heard. Central nervous system: Alert and oriented. No focal neurological deficits. Extremities: Symmetric 5 x 5 power. Minimal bruising over left hip & left groin site.   Data Reviewed: Basic Metabolic Panel:  Recent Labs Lab 06/06/2015 1743 06/12/15 0442 06/16/15 2302 06/17/15 0524  NA 139 139 129* 131*  K 4.3 4.0 4.3 4.5  CL 97* 101 94* 100*  CO2 33*  GLUCOSE 117* 111* 152* 115*  BUN CREATININE 0.95 0.84 0.97 0.88  CALCIUM 9.6 9.3 8.4* 8.0*   Liver Function Tests: No results for input(s): AST, ALT, ALKPHOS, BILITOT, PROT, ALBUMIN in the last 168 hours. No results for input(s): LIPASE, AMYLASE in the last 168 hours. No results for input(s): AMMONIA in the last 168 hours. CBC:  Recent Labs Lab  06/02/2015 1743 06/12/15 0442 06/16/15 2302 06/17/15 0659  WBC 9.2 10.2 9.6 6.5  NEUTROABS 5.9  --   --   --   HGB 10.1* 10.5* 8.1* 7.3*  HCT 32.0* 32.6* 24.7* 22.5*  MCV 93.3 94.5 91.1 92.6  PLT 289 285 225 211   Cardiac Enzymes: No results for input(s): CKTOTAL, CKMB, CKMBINDEX, TROPONINI in the last 168 hours. BNP (last 3 results) No results for input(s): PROBNP in the last 8760 hours. CBG: No results for input(s): GLUCAP in the last 168 hours.  Recent Results (from the past 240 hour(s))  Culture, Urine     Status: None   Collection Time: 06/08/2015  8:12 PM  Result Value Ref Range Status   Specimen Description URINE, RANDOM  Final   Special Requests NONE  Final   Culture >=100,000 COLONIES/mL KLEBSIELLA PNEUMONIAE  Final   Report Status 06/14/2015 FINAL  Final   Organism ID, Bacteria KLEBSIELLA PNEUMONIAE  Final      Susceptibility   Klebsiella pneumoniae - MIC*    AMPICILLIN 16 RESISTANT Resistant     CEFAZOLIN <=4 SENSITIVE Sensitive     CEFTRIAXONE <=1 SENSITIVE Sensitive     CIPROFLOXACIN <=0.25 SENSITIVE Sensitive     GENTAMICIN <=1 SENSITIVE Sensitive     IMIPENEM <=0.25 SENSITIVE Sensitive     NITROFURANTOIN 32 SENSITIVE Sensitive     TRIMETH/SULFA <=20 SENSITIVE Sensitive     AMPICILLIN/SULBACTAM <=2 SENSITIVE Sensitive     PIP/TAZO <=4 SENSITIVE Sensitive     * >=100,000 COLONIES/mL KLEBSIELLA PNEUMONIAE         Studies: Ct Abdomen Pelvis Wo Contrast  06/16/2015  CLINICAL DATA:  Abdominal pain since injury on 06/02/2015. Known pelvic fractures. EXAM: CT ABDOMEN AND PELVIS WITHOUT CONTRAST TECHNIQUE: Multidetector CT imaging of the abdomen and pelvis was performed following the standard protocol without IV contrast. COMPARISON:  Abdominal and hip radiographs 06/28/2015. FINDINGS: Lower chest: Small dependent pleural effusions bilaterally with associated bibasilar atelectasis. The heart is mildly enlarged. There are mitral annular calcifications. Hepatobiliary:  The liver demonstrates no focal abnormality as imaged in the noncontrast state. The gallbladder is not clearly visualized, presumably surgically absent. No significant biliary dilatation. Pancreas: Diffusely atrophied without apparent focal abnormality or surrounding inflammation. Spleen: Normal in size without focal abnormality. Adrenals/Urinary Tract: Both adrenal glands appear normal. There is a small nonobstructing calculus in the upper pole of the right kidney. There are several sub cm hyperdense renal lesions bilaterally. No hydronephrosis or ureteral calculus. Mild bladder wall thickening is present. Stomach/Bowel: No evidence of bowel wall thickening, distention or surrounding inflammatory change. Diverticular changes of the distal colon noted. Vascular/Lymphatic: There are no enlarged abdominal or pelvic lymph nodes. Moderate atherosclerosis of the aorta, its branches and the iliac arteries. Reproductive: The uterus is atrophied.  No evidence of adnexal mass. Other: There is a large hematoma extending superiorly from the left pubic rami fractures. This is located deep to the anterior abdominal wall musculature, in the pre vesicle space. This hematoma measures approximately 14.6 x 6.3 x 8.2 cm. No significant rectus sheath  or pelvic sidewall hematoma. No evidence of hemoperitoneum. Musculoskeletal: As seen on recent radiographs, there are acute moderately displaced fractures of the left superior and inferior pubic rami. There is a probable nondisplaced fracture of the left sacrum. There are old fractures of the right pubic rami. Patient is status post bilateral proximal femoral ORIF. There is a chronic inferior endplate compression fracture at L3. IMPRESSION: 1. Large acute/subacute hematoma in the prevesical space subsequent to recently demonstrated displaced fractures of the left pubic rami. 2. No evidence of hemoperitoneum. 3. Bilateral pleural effusions and bibasilar atelectasis. 4. Small hyperdense  renal lesions of doubtful significance. 5. These results were called by telephone at the time of interpretation on 06/16/2015 at 9:41 pm to the patient's nurse, Clarita Crane, who verbally acknowledged these results. Electronically Signed   By: Carey Bullocks M.D.   On: 06/16/2015 21:41   Dg Abd Portable 1v  06/16/2015  CLINICAL DATA:  Generalized abdominal pain. EXAM: PORTABLE ABDOMEN - 1 VIEW COMPARISON:  None. FINDINGS: The bowel gas pattern is normal. No radio-opaque calculi are seen. Surgical sutures are seen in the right side of the abdomen. Postsurgical changes are seen involving both proximal femurs. IMPRESSION: No evidence of bowel obstruction or ileus. Electronically Signed   By: Lupita Raider, M.D.   On: 06/16/2015 09:24        Scheduled Meds: . sodium chloride   Intravenous Once  . cefUROXime  250 mg Oral BID WC  . feeding supplement  1 Container Oral Q24H  . levothyroxine  100 mcg Oral QAC breakfast  . metoprolol  12.5 mg Oral BID  . polyethylene glycol  34 g Oral Daily  . senna-docusate  2 tablet Oral BID   Continuous Infusions: . sodium chloride 50 mL/hr at 06/17/15 0908    Active Problems:   Hypertension   Hypothyroidism   Fracture of multiple pubic rami (HCC)   Dehydration    Time spent: 25 minutes    Annamaria Salah, MD. [facility] Pager [phone]  If 7PM-7AM, please contact night-coverage [email or web address] Password [email or web address] 06/17/2015, 4:37 PM    LOS: 6 days

## 2015-06-17 NOTE — Progress Notes (Signed)
CSW notified yesterday afternoon by nursing that d/c to [facility] was cancelled for the day as patient was complaining of severe abdomen pain.  Notified Janie- Admissions at [facility]. Will await decision per MD re: stability.  Nursing stating that they would notify patient's son [name] of above.  Lorri Frederick. Jaci Laz[place], [place] [phone]

## 2015-06-17 NOTE — Care Management Important Message (Signed)
Important Message  Patient Details  Name: DANIA MARSAN MRN: [id number] Date of Birth: 06-18-1915   Medicare Important Message Given:  Yes    Kinsley Nicklaus P Keinan Brouillet 06/17/2015, 12:27 PM

## 2015-06-18 DIAGNOSIS — E871 Hypo-osmolality and hyponatremia: Secondary | ICD-10-CM

## 2015-06-18 DIAGNOSIS — D62 Acute posthemorrhagic anemia: Secondary | ICD-10-CM

## 2015-06-18 DIAGNOSIS — L8990 Pressure ulcer of unspecified site, unspecified stage: Secondary | ICD-10-CM | POA: Insufficient documentation

## 2015-06-18 LAB — TYPE AND SCREEN
ABO/RH(D): B POS
Antibody Screen: NEGATIVE
Unit division: 0
Unit division: 0

## 2015-06-18 LAB — CBC
HCT: 30.6 % — ABNORMAL LOW (ref 36.0–46.0)
HEMOGLOBIN: 10.5 g/dL — AB (ref 12.0–15.0)
MCH: 30.5 pg (ref 26.0–34.0)
MCHC: 34.3 g/dL (ref 30.0–36.0)
MCV: 89 fL (ref 78.0–100.0)
Platelets: 163 10*3/uL (ref 150–400)
RBC: 3.44 MIL/uL — AB (ref 3.87–5.11)
RDW: 15.3 % (ref 11.5–15.5)
WBC: 6.6 10*3/uL (ref 4.0–10.5)

## 2015-06-18 LAB — BASIC METABOLIC PANEL WITH GFR
Anion gap: 6 (ref 5–15)
BUN: 11 mg/dL (ref 6–20)
CO2: 25 mmol/L (ref 22–32)
Calcium: 8 mg/dL — ABNORMAL LOW (ref 8.9–10.3)
Chloride: 102 mmol/L (ref 101–111)
Creatinine, Ser: 0.61 mg/dL (ref 0.44–1.00)
GFR calc Af Amer: >60 mL/min
GFR calc non Af Amer: >60 mL/min
Glucose, Bld: 108 mg/dL — ABNORMAL HIGH (ref 65–99)
Potassium: 3.6 mmol/L (ref 3.5–5.1)
Sodium: 133 mmol/L — ABNORMAL LOW (ref 135–145)

## 2015-06-18 MED ORDER — GUAIFENESIN 100 MG/5ML PO SOLN
5.0000 mL | ORAL | Status: DC | PRN
  Administered 2015-06-18 – 2015-06-19 (×5): 100 mg via ORAL
  Filled 2015-06-18 (×4): qty 5

## 2015-06-18 MED ORDER — POTASSIUM CHLORIDE CRYS ER 20 MEQ PO TBCR
40.0000 meq | EXTENDED_RELEASE_TABLET | Freq: Once | ORAL | Status: AC
  Administered 2015-06-18: 40 meq via ORAL
  Filled 2015-06-18: qty 2

## 2015-06-18 NOTE — Progress Notes (Signed)
CSW was contacted by facility to see if Pt would be ready for d/c today.   CSW contacted Pt. MD and Pt will not be ready for d/c today as the Pt is not medically stable.   CSW contacted [facility]'s admission coordinator and passed along information.   Facility will be able to accept Pt when medically ready for d/c.   Krystal Teachey LCSWA  [facility] [phone]      

## 2015-06-18 NOTE — Progress Notes (Addendum)
PROGRESS NOTE    Misty Shah ZOX:[id number] DOB: [birth year] DOA: 06/17/2015 PCP: Michiel Sites, MD  HPI/Brief narrative 82 year old female, lives alone, ambulates with the help of a walker, history of HTN, hypothyroid, anxiety, presented to [facility] ED on 06/27/2015 following a mechanical fall at home when she tried to open her refrigerator door that felt stuck. She sustained this fall on 06/02/15 but was unable to see her physician until 06/05/2015 and was "roughing it out" with the help of her son. X-ray of the hip confirmed a displaced left superior and inferior pubic rami fractures. Orthopedics consulted and recommended nonoperative management. PT evaluated and recommended SNF. Social worker consulted and awaiting SNF bed availability, prior to discharge patient had complaints of abdominal pain, CT abdomen pelvis significant for Large acute/subacute hematoma in the prevesical space subsequent to recently demonstrated displaced fractures of the left pubic rami  Assessment/Plan:   Left hip superior and inferior pubic rami fracture, displaced - Orthopedic follow-up appreciated. Recommends partial weightbearing on the left, pain management, PT and OT evaluation and possible DC to SNF. - PT recommends SNF. Per orthopedics  Anemia secondary to blood loss from hematoma at left pubic ramus fracture - Patient is lying hemoglobin 12.8 as an outpatient, was 10.1 on admission, 7.3 on 1/19, transfused 2 units PRBC 1/19, Hgb 10.5 today, continue to hold DVT prophylaxis and aspirin. - Check CBC in a.m., hemoglobin remained stable can discharge to SNF  Klebsiella pneumoniae UTI - Confirmed on urine culture. Patient denies dysuria. Complete 3 days of oral Ceftin.  Hyponatremia - Secondary to volume depletion, improved with volume resuscitation  Essential hypertension - Amlodipine held. On reduced dose of metoprolol. Reasonably controlled.  Dehydration - Resolved after IV fluids. DC IV fluids. Resumed  diet.  Hypothyroid - Continue Synthroid.  Anxiety - When necessary Valium  RBBB - Not new.  Anemia - Stable    DVT prophylaxis: Lovenox  Code Status: Full Family Communication: D/E son VIA phone 1/19 Disposition Plan: DC to SNF in am if Hgb remains stable.    Consultants:  Orthopedics  Procedures:  Foley- DC'ed 1/15  Antimicrobials:  None   Subjective: Patient states that her left hip pain continues to improve.  Objective: Filed Vitals:   06/17/15 2114 06/17/15 2137 06/18/15 0509 06/18/15 1510  BP: 151/65 151/65 150/64 129/76  Pulse:  116 95 72  Temp:  99.6 F (37.6 C) 98.2 F (36.8 C) 99 F (37.2 C)  TempSrc:  Oral Oral Oral  Resp:  Height:      Weight:      SpO2:  94% 92% 91%    Intake/Output Summary (Last 24 hours) at 06/18/15 1650 Last data filed at 06/18/15 1512  Gross per 24 hour  Intake   1700 ml  Output      2 ml  Net   1698 ml   Filed Weights   06/22/2015 2145  Weight: 51.5 kg (113 lb 8.6 oz)    Exam:  General exam: Pleasant elderly frail female patient lying comfortably in bed. Respiratory system: Clear. No increased work of breathing. Cardiovascular system: S1 & S2 heard, RRR. No JVD, murmurs, gallops, clicks or pedal edema. Gastrointestinal system: Abdomen is nondistended, soft , minimal diffuse tenderness. Normal bowel sounds heard. Central nervous system: Alert and oriented. No focal neurological deficits. Extremities: Symmetric 5 x 5 power. Minimal bruising over left hip & left groin site.   Data Reviewed: Basic Metabolic Panel:  Recent Labs Lab 06/26/2015 1743  06/12/15 0442 06/16/15 2302 06/17/15 0524 06/18/15 0504  NA 139 139 129* 131* 133*  K 4.3 4.0 4.3 4.5 3.6  CL 97* 101 94* 100* 102  CO2 33* GLUCOSE 117* 111* 152* 115* 108*  BUN CREATININE 0.95 0.84 0.97 0.88 0.61  CALCIUM 9.6 9.3 8.4* 8.0* 8.0*   Liver Function Tests: No results for input(s): AST, ALT, ALKPHOS,  BILITOT, PROT, ALBUMIN in the last 168 hours. No results for input(s): LIPASE, AMYLASE in the last 168 hours. No results for input(s): AMMONIA in the last 168 hours. CBC:  Recent Labs Lab 06-25-15 1743 06/12/15 0442 06/16/15 2302 06/17/15 0659 06/18/15 0504  WBC 9.2 10.2 9.6 6.5 6.6  NEUTROABS 5.9  --   --   --   --   HGB 10.1* 10.5* 8.1* 7.3* 10.5*  HCT 32.0* 32.6* 24.7* 22.5* 30.6*  MCV 93.3 94.5 91.1 92.6 89.0  PLT 289 285 225 211 163   Cardiac Enzymes: No results for input(s): CKTOTAL, CKMB, CKMBINDEX, TROPONINI in the last 168 hours. BNP (last 3 results) No results for input(s): PROBNP in the last 8760 hours. CBG: No results for input(s): GLUCAP in the last 168 hours.  Recent Results (from the past 240 hour(s))  Culture, Urine     Status: None   Collection Time: 06-25-15  8:12 PM  Result Value Ref Range Status   Specimen Description URINE, RANDOM  Final   Special Requests NONE  Final   Culture >=100,000 COLONIES/mL KLEBSIELLA PNEUMONIAE  Final   Report Status 06/14/2015 FINAL  Final   Organism ID, Bacteria KLEBSIELLA PNEUMONIAE  Final      Susceptibility   Klebsiella pneumoniae - MIC*    AMPICILLIN 16 RESISTANT Resistant     CEFAZOLIN <=4 SENSITIVE Sensitive     CEFTRIAXONE <=1 SENSITIVE Sensitive     CIPROFLOXACIN <=0.25 SENSITIVE Sensitive     GENTAMICIN <=1 SENSITIVE Sensitive     IMIPENEM <=0.25 SENSITIVE Sensitive     NITROFURANTOIN 32 SENSITIVE Sensitive     TRIMETH/SULFA <=20 SENSITIVE Sensitive     AMPICILLIN/SULBACTAM <=2 SENSITIVE Sensitive     PIP/TAZO <=4 SENSITIVE Sensitive     * >=100,000 COLONIES/mL KLEBSIELLA PNEUMONIAE         Studies: Ct Abdomen Pelvis Wo Contrast  06/16/2015  CLINICAL DATA:  Abdominal pain since injury on 06/02/2015. Known pelvic fractures. EXAM: CT ABDOMEN AND PELVIS WITHOUT CONTRAST TECHNIQUE: Multidetector CT imaging of the abdomen and pelvis was performed following the standard protocol without IV contrast.  COMPARISON:  Abdominal and hip radiographs 2015-06-25. FINDINGS: Lower chest: Small dependent pleural effusions bilaterally with associated bibasilar atelectasis. The heart is mildly enlarged. There are mitral annular calcifications. Hepatobiliary: The liver demonstrates no focal abnormality as imaged in the noncontrast state. The gallbladder is not clearly visualized, presumably surgically absent. No significant biliary dilatation. Pancreas: Diffusely atrophied without apparent focal abnormality or surrounding inflammation. Spleen: Normal in size without focal abnormality. Adrenals/Urinary Tract: Both adrenal glands appear normal. There is a small nonobstructing calculus in the upper pole of the right kidney. There are several sub cm hyperdense renal lesions bilaterally. No hydronephrosis or ureteral calculus. Mild bladder wall thickening is present. Stomach/Bowel: No evidence of bowel wall thickening, distention or surrounding inflammatory change. Diverticular changes of the distal colon noted. Vascular/Lymphatic: There are no enlarged abdominal or pelvic lymph nodes. Moderate atherosclerosis of the aorta, its branches and the iliac arteries. Reproductive: The uterus is atrophied.  No  evidence of adnexal mass. Other: There is a large hematoma extending superiorly from the left pubic rami fractures. This is located deep to the anterior abdominal wall musculature, in the pre vesicle space. This hematoma measures approximately 14.6 x 6.3 x 8.2 cm. No significant rectus sheath or pelvic sidewall hematoma. No evidence of hemoperitoneum. Musculoskeletal: As seen on recent radiographs, there are acute moderately displaced fractures of the left superior and inferior pubic rami. There is a probable nondisplaced fracture of the left sacrum. There are old fractures of the right pubic rami. Patient is status post bilateral proximal femoral ORIF. There is a chronic inferior endplate compression fracture at L3. IMPRESSION: 1.  Large acute/subacute hematoma in the prevesical space subsequent to recently demonstrated displaced fractures of the left pubic rami. 2. No evidence of hemoperitoneum. 3. Bilateral pleural effusions and bibasilar atelectasis. 4. Small hyperdense renal lesions of doubtful significance. 5. These results were called by telephone at the time of interpretation on 06/16/2015 at 9:41 pm to the patient's nurse, Clarita Crane, who verbally acknowledged these results. Electronically Signed   By: Carey Bullocks M.D.   On: 06/16/2015 21:41        Scheduled Meds: . sodium chloride   Intravenous Once  . cefUROXime  250 mg Oral BID WC  . feeding supplement  1 Container Oral Q24H  . levothyroxine  100 mcg Oral QAC breakfast  . metoprolol  12.5 mg Oral BID  . potassium chloride  40 mEq Oral Once   Continuous Infusions: . sodium chloride 50 mL/hr at 06/18/15 1544    Active Problems:   Hypertension   Hypothyroidism   Fracture of multiple pubic rami (HCC)   Dehydration   Pressure ulcer    Time spent: 15 minutes    Clarke Peretz, MD. [facility] Pager [phone]  If 7PM-7AM, please contact night-coverage [email or web address] Password [email or web address] 06/18/2015, 4:50 PM    LOS: 7 days

## 2015-06-18 NOTE — Progress Notes (Signed)
No change in d/c plan at this time. Awaiting stability and pending bed availability- will d/c to [facility].  MD: Please clarify anticipated d/c date if weekend d/c is needed.  Written handoff provided to receiving CSW - Leron Croak, LCSW for follow up. Lorri Frederick. Jaci [place]az[place], [place] [phone]

## 2015-06-18 NOTE — Consult Note (Signed)
WOC wound consult note Reason for Consult: Consult requested for sacrum wound.  Pt is frequently incontinent of urine and stool and it is difficult to keep the location from becoming soiled.  Previous foam dressing is saturated and trapping moisture against skin.  Pt is very thin and has protruding sacral bone to affected area. Wound type: Deep tissue pressure injury to sacrum Pressure Ulcer POA: No Measurement: .8X.8cm Wound bed: dark purple-red with intact skin Drainage (amount, consistency, odor) No odor or drainage Periwound: Intact skin surrounding Dressing procedure/placement/frequency: Barrier cream to protect affected area; apply with each turning or cleaning session.  Reposition off affected area as often as possible to reduce pressure.  No family at bedside to discuss plan of care.  Deep tissue injuries frequently evolve into full thickness tissue loss despite optimal plan of care. Please re-consult if further assistance is needed.  Thank-you,  Cammie Mcgee MSN, RN, CWOCN, [place], CNS [phone]

## 2015-06-18 NOTE — Progress Notes (Signed)
Hematoma requires no current intervention

## 2015-06-19 ENCOUNTER — Inpatient Hospital Stay (HOSPITAL_COMMUNITY): Payer: Medicare Other

## 2015-06-19 DIAGNOSIS — I4891 Unspecified atrial fibrillation: Secondary | ICD-10-CM | POA: Insufficient documentation

## 2015-06-19 DIAGNOSIS — J189 Pneumonia, unspecified organism: Secondary | ICD-10-CM

## 2015-06-19 DIAGNOSIS — R0902 Hypoxemia: Secondary | ICD-10-CM | POA: Insufficient documentation

## 2015-06-19 LAB — CBC
HCT: 34.1 % — ABNORMAL LOW (ref 36.0–46.0)
Hemoglobin: 11.6 g/dL — ABNORMAL LOW (ref 12.0–15.0)
MCH: 30.4 pg (ref 26.0–34.0)
MCHC: 34 g/dL (ref 30.0–36.0)
MCV: 89.3 fL (ref 78.0–100.0)
PLATELETS: 203 10*3/uL (ref 150–400)
RBC: 3.82 MIL/uL — AB (ref 3.87–5.11)
RDW: 15.2 % (ref 11.5–15.5)
WBC: 12.1 10*3/uL — AB (ref 4.0–10.5)

## 2015-06-19 MED ORDER — DILTIAZEM LOAD VIA INFUSION
10.0000 mg | Freq: Once | INTRAVENOUS | Status: AC
  Administered 2015-06-19: 10 mg via INTRAVENOUS
  Filled 2015-06-19: qty 10

## 2015-06-19 MED ORDER — HEPARIN SODIUM (PORCINE) 5000 UNIT/ML IJ SOLN
5000.0000 [IU] | Freq: Three times a day (TID) | INTRAMUSCULAR | Status: DC
  Administered 2015-06-19 – 2015-06-20 (×3): 5000 [IU] via SUBCUTANEOUS
  Filled 2015-06-19 (×2): qty 1

## 2015-06-19 MED ORDER — FUROSEMIDE 10 MG/ML IJ SOLN
40.0000 mg | Freq: Once | INTRAMUSCULAR | Status: AC
  Administered 2015-06-19: 40 mg via INTRAVENOUS
  Filled 2015-06-19: qty 4

## 2015-06-19 MED ORDER — METOPROLOL TARTRATE 25 MG PO TABS: 25.0000 mg | ORAL_TABLET | Freq: Two times a day (BID) | ORAL | Status: DC

## 2015-06-19 MED ORDER — METOPROLOL TARTRATE 1 MG/ML IV SOLN
5.0000 mg | Freq: Once | INTRAVENOUS | Status: AC
  Administered 2015-06-19: 5 mg via INTRAVENOUS
  Filled 2015-06-19: qty 5

## 2015-06-19 MED ORDER — DIGOXIN 0.25 MG/ML IJ SOLN: 0.2500 mg | Freq: Once | INTRAMUSCULAR | Status: DC

## 2015-06-19 MED ORDER — METOPROLOL TARTRATE 50 MG PO TABS
50.0000 mg | ORAL_TABLET | Freq: Once | ORAL | Status: AC
  Administered 2015-06-19: 50 mg via ORAL
  Filled 2015-06-19: qty 1

## 2015-06-19 MED ORDER — METOPROLOL TARTRATE 50 MG PO TABS
50.0000 mg | ORAL_TABLET | Freq: Two times a day (BID) | ORAL | Status: DC
  Administered 2015-06-19 – 2015-06-20 (×2): 50 mg via ORAL
  Filled 2015-06-19 (×2): qty 1

## 2015-06-19 MED ORDER — PIPERACILLIN-TAZOBACTAM 3.375 G IVPB
3.3750 g | Freq: Three times a day (TID) | INTRAVENOUS | Status: DC
  Administered 2015-06-19 – 2015-06-23 (×12): 3.375 g via INTRAVENOUS
  Filled 2015-06-19 (×14): qty 50

## 2015-06-19 MED ORDER — WHITE PETROLATUM GEL
Status: AC
  Administered 2015-06-19: 0.2
  Filled 2015-06-19: qty 1

## 2015-06-19 MED ORDER — VANCOMYCIN HCL 500 MG IV SOLR
500.0000 mg | INTRAVENOUS | Status: DC
  Administered 2015-06-19 – 2015-06-22 (×4): 500 mg via INTRAVENOUS
  Filled 2015-06-19 (×5): qty 500

## 2015-06-19 MED ORDER — DILTIAZEM HCL 100 MG IV SOLR
5.0000 mg/h | INTRAVENOUS | Status: DC
  Administered 2015-06-19: 5 mg/h via INTRAVENOUS
  Filled 2015-06-19 (×2): qty 100

## 2015-06-19 NOTE — Progress Notes (Signed)
ANTIBIOTIC CONSULT NOTE - INITIAL  Pharmacy Consult for vancomycin + zosyn Indication: rule out pneumonia  Allergies  Allergen Reactions  . Codeine Other (See Comments)    unknown  . Epinephrine Other (See Comments)    unknown  . Sulfur Other (See Comments)    unknown  . Tetanus Toxoids Other (See Comments)    unknown    Patient Measurements: Height: 5' (152.4 cm) Weight: 113 lb 8.6 oz (51.5 kg) IBW/kg (Calculated) : 45.5 Adjusted Body Weight:   Vital Signs: Temp: 97.9 F (36.6 C) (01/21 1000) Temp Source: Oral (01/21 1000) BP: 153/87 mmHg (01/21 1000) Pulse Rate: 113 (01/21 1000) Intake/Output from previous day: 01/20 0701 - 01/21 0700 In: 1900 [P.O.:660; I.V.:1240] Out: -  Intake/Output from this shift: Total I/O In: 120 [P.O.:120] Out: -   Labs:  Recent Labs  06/16/15 2302 06/17/15 0524 06/17/15 0659 06/18/15 0504 06/19/15 0518  WBC 9.6  --  6.5 6.6 12.1*  HGB 8.1*  --  7.3* 10.5* 11.6*  PLT 225  --  211 163 203  CREATININE 0.97 0.88  --  0.61  --    Estimated Creatinine Clearance: 28.9 mL/min (by C-G formula based on Cr of 0.61). No results for input(s): VANCOTROUGH, VANCOPEAK, VANCORANDOM, GENTTROUGH, GENTPEAK, GENTRANDOM, TOBRATROUGH, TOBRAPEAK, TOBRARND, AMIKACINPEAK, AMIKACINTROU, AMIKACIN in the last 72 hours.   Microbiology: Recent Results (from the past 720 hour(s))  Culture, Urine     Status: None   Collection Time: 2015/06/19  8:12 PM  Result Value Ref Range Status   Specimen Description URINE, RANDOM  Final   Special Requests NONE  Final   Culture >=100,000 COLONIES/mL KLEBSIELLA PNEUMONIAE  Final   Report Status 06/14/2015 FINAL  Final   Organism ID, Bacteria KLEBSIELLA PNEUMONIAE  Final      Susceptibility   Klebsiella pneumoniae - MIC*    AMPICILLIN 16 RESISTANT Resistant     CEFAZOLIN <=4 SENSITIVE Sensitive     CEFTRIAXONE <=1 SENSITIVE Sensitive     CIPROFLOXACIN <=0.25 SENSITIVE Sensitive     GENTAMICIN <=1 SENSITIVE Sensitive      IMIPENEM <=0.25 SENSITIVE Sensitive     NITROFURANTOIN 32 SENSITIVE Sensitive     TRIMETH/SULFA <=20 SENSITIVE Sensitive     AMPICILLIN/SULBACTAM <=2 SENSITIVE Sensitive     PIP/TAZO <=4 SENSITIVE Sensitive     * >=100,000 COLONIES/mL KLEBSIELLA PNEUMONIAE    Medical History: Past Medical History  Diagnosis Date  . Hypertension   . Hypothyroidism   . UTI (lower urinary tract infection)   . Anxiety   . Claustrophobia   . Dislocated shoulder     hx bilateral shoulders - had therapy on them  . Shingles   . Fracture of ramus of left pubis (HCC) 05/2015    displaced left superior and inferior pubic rami fractures.  . Macular degeneration of both eyes   . Arthritis     "all over"    Medications:  Anti-infectives    Start     Dose/Rate Route Frequency Ordered Stop   06/19/15 1330  vancomycin (VANCOCIN) 500 mg in sodium chloride 0.9 % 100 mL IVPB     500 mg 100 mL/hr over 60 Minutes Intravenous Every 24 hours 06/19/15 1240     06/19/15 1330  piperacillin-tazobactam (ZOSYN) IVPB 3.375 g     3.375 g 12.5 mL/hr over 240 Minutes Intravenous Every 8 hours 06/19/15 1240     06/16/15 0000  cefUROXime (CEFTIN) 250 MG tablet     250 mg Oral 2 times  daily with meals 06/16/15 1444 06/16/15 2359   06/14/15 0800  cefUROXime (CEFTIN) tablet 250 mg  Status:  Discontinued     250 mg Oral 2 times daily with meals 06/14/15 0728 06/18/15 1656     Assessment: 89 yof presented to the hospital initially with hip pain d/t a fall. Now initiating broad-spectrum antibiotics with vancomycin and zosyn for possible HCAP. Pt is afebrile and WBC is elevated at 12.1. SCr is WNL.   Vanc 1/21>> Zosyn 1/21>> Cefuroxime 116>>1/20  1/13 Urine - Kleb pneumo  Goal of Therapy:  Vancomycin trough level 15-20 mcg/ml  Plan:  - Vancomycin  IV Q24H - Zosyn 3.375gm IV Q8H (4 hr inf) - F/u renal fxn, C&S, clinical status and trough at [facility]  Jheri Mitter, Drake Leach 06/19/2015,12:40 PM

## 2015-06-19 NOTE — Progress Notes (Signed)
Patient running Atrial Fib with HR 140's-160's on the monitor. Dr. Randol Kern notified. New orders received.

## 2015-06-19 NOTE — Progress Notes (Addendum)
PROGRESS NOTE    Misty Shah:[id number] DOB: [birth year] DOA: 06-19-2015 PCP: Michiel Sites, MD  HPI/Brief narrative 82 year old female, lives alone, ambulates with the help of a walker, history of HTN, hypothyroid, anxiety, presented to [facility] ED on 06-19-15 following a mechanical fall at home when she tried to open her refrigerator door that felt stuck. She sustained this fall on 06/02/15 but was unable to see her physician until June 19, 2015 and was "roughing it out" with the help of her son. X-ray of the hip confirmed a displaced left superior and inferior pubic rami fractures. Orthopedics consulted and recommended nonoperative management. PT evaluated and recommended SNF. Social worker consulted and awaiting SNF bed availability, prior to discharge patient had complaints of abdominal pain, CT abdomen pelvis significant for Large acute/subacute hematoma in the prevesical space subsequent to recently demonstrated displaced fractures of the left pubic rami  Assessment/Plan:   Left hip superior and inferior pubic rami fracture, displaced - Orthopedic follow-up appreciated. Recommends partial weightbearing on the left, pain management, PT and OT evaluation and possible DC to SNF. - PT recommends SNF. Per orthopedics  Anemia secondary to blood loss from hematoma at left pubic ramus fracture - Patient is lying hemoglobin 12.8 as an outpatient, was 10.1 on admission, 7.3 on 1/19, transfused 2 units PRBC 1/19, hemoglobin been stable since - We'll resume subcutaneous heparin for DVT prophylaxis, monitor hemoglobin closely  Hypoxic respiratory failure/HCAP - Patient with hypoxia, tachypnea and increased respiratory distress overnight, chest x-ray showing by apical infiltrate, low-grade temperature, tachycardia, tachypnea and leukocytosis. - We'll start on IV vancomycin and Zosyn for HCAP. - We'll start on IV Lasix as well. - Continue with incentive spirometry and flutter valve.  New Onset A.  Fib - Patient was noticed to be tachycardic, was in A. fib with RVR on telemetry, and a candidate for anticoagulation giving her recent fall and hematoma. - We'll give IV Cardizem 5 mg one dose, her metoprolol dose was increased, will give one dose 50 mg by mouth once.  Klebsiella pneumoniae UTI - Confirmed on urine culture. Patient denies dysuria. Complete 3 days of oral Ceftin.  Hyponatremia - Secondary to volume depletion, improved with volume resuscitation  Essential hypertension - Initially on the lower side, started to increase, we'll increase metoprolol home dose , resume amlodipine in a.m. if remains uncontrolled .  Dehydration - Resolved after IV fluids. DC IV fluids. Resumed diet.  Hypothyroid - Continue Synthroid.  Anxiety - When necessary Valium  RBBB - Not new.  Anemia - Stable    DVT prophylaxis: Lovenox  Code Status: Full Family Communication: D/E son VIA phone 1/21 Disposition Plan: DC to SNF when respiratory status improves    Consultants:  Orthopedics  Procedures:  Foley- DC'ed 1/15  Antimicrobials:  None   Subjective: Patient states that her left hip pain continues to improve.  Objective: Filed Vitals:   06/19/15 0215 06/19/15 0249 06/19/15 0526 06/19/15 1000  BP:   140/92 153/87  Pulse: 142  101 113  Temp: 97.6 F (36.4 C)  97.7 F (36.5 C) 97.9 F (36.6 C)  TempSrc: Oral  Oral Oral  Resp: Height:      Weight:      SpO2: 92% 94% 94% 95%    Intake/Output Summary (Last 24 hours) at 06/19/15 1216 Last data filed at 06/19/15 1008  Gross per 24 hour  Intake   1900 ml  Output      0 ml  Net  1900 ml   Filed Weights   06-14-2015 2145  Weight: 51.5 kg (113 lb 8.6 oz)    Exam:  General exam: Pleasant elderly frail female patient lying comfortably in bed. Respiratory system: Clear. No wheezing, good air entry bilaterally. Cardiovascular system: S1 & S2 heard, RRR. No JVD, murmurs, gallops, clicks or pedal  edema. Gastrointestinal system: Abdomen is nondistended, soft , minimal diffuse tenderness. Normal bowel sounds heard. Central nervous system: Alert and oriented. No focal neurological deficits. Extremities: Symmetric 5 x 5 power. Minimal bruising over left hip & left groin site.   Data Reviewed: Basic Metabolic Panel:  Recent Labs Lab 06/16/15 2302 06/17/15 0524 06/18/15 0504  NA 129* 131* 133*  K 4.3 4.5 3.6  CL 94* 100* 102  CO2 GLUCOSE 152* 115* 108*  BUN CREATININE 0.97 0.88 0.61  CALCIUM 8.4* 8.0* 8.0*   Liver Function Tests: No results for input(s): AST, ALT, ALKPHOS, BILITOT, PROT, ALBUMIN in the last 168 hours. No results for input(s): LIPASE, AMYLASE in the last 168 hours. No results for input(s): AMMONIA in the last 168 hours. CBC:  Recent Labs Lab 06/16/15 2302 06/17/15 0659 06/18/15 0504 06/19/15 0518  WBC 9.6 6.5 6.6 12.1*  HGB 8.1* 7.3* 10.5* 11.6*  HCT 24.7* 22.5* 30.6* 34.1*  MCV 91.1 92.6 89.0 89.3  PLT 225 211 163 203   Cardiac Enzymes: No results for input(s): CKTOTAL, CKMB, CKMBINDEX, TROPONINI in the last 168 hours. BNP (last 3 results) No results for input(s): PROBNP in the last 8760 hours. CBG: No results for input(s): GLUCAP in the last 168 hours.  Recent Results (from the past 240 hour(s))  Culture, Urine     Status: None   Collection Time: 2015-06-14  8:12 PM  Result Value Ref Range Status   Specimen Description URINE, RANDOM  Final   Special Requests NONE  Final   Culture >=100,000 COLONIES/mL KLEBSIELLA PNEUMONIAE  Final   Report Status 06/14/2015 FINAL  Final   Organism ID, Bacteria KLEBSIELLA PNEUMONIAE  Final      Susceptibility   Klebsiella pneumoniae - MIC*    AMPICILLIN 16 RESISTANT Resistant     CEFAZOLIN <=4 SENSITIVE Sensitive     CEFTRIAXONE <=1 SENSITIVE Sensitive     CIPROFLOXACIN <=0.25 SENSITIVE Sensitive     GENTAMICIN <=1 SENSITIVE Sensitive     IMIPENEM <=0.25 SENSITIVE Sensitive      NITROFURANTOIN 32 SENSITIVE Sensitive     TRIMETH/SULFA <=20 SENSITIVE Sensitive     AMPICILLIN/SULBACTAM <=2 SENSITIVE Sensitive     PIP/TAZO <=4 SENSITIVE Sensitive     * >=100,000 COLONIES/mL KLEBSIELLA PNEUMONIAE         Studies: Dg Chest Port 1 View  06/19/2015  CLINICAL DATA:  Shortness of breath for 1 week EXAM: PORTABLE CHEST - 1 VIEW COMPARISON:  06/14/15 FINDINGS: Cardiac shadow is stable. Biapical infiltrates are noted right greater than left new from the prior exam. No sizable effusion is seen. No acute bony abnormality is noted. IMPRESSION: Biapical infiltrates right greater than left. Electronically Signed   By: Alcide Clever M.D.   On: 06/19/2015 08:44        Scheduled Meds: . sodium chloride   Intravenous Once  . feeding supplement  1 Container Oral Q24H  . furosemide  40 mg Intravenous Once  . heparin subcutaneous  5,000 Units Subcutaneous 3 times per day  . levothyroxine  100 mcg Oral QAC breakfast  . metoprolol  12.5 mg Oral  BID   Continuous Infusions:    Active Problems:   Hypertension   Hypothyroidism   Fracture of multiple pubic rami (HCC)   Dehydration   Pressure ulcer   Acute blood loss anemia   Hyponatremia    Time spent: 25 minutes    Dewitt Judice, MD. [facility] Pager [phone]  If 7PM-7AM, please contact night-coverage [email or web address] Password [email or web address] 06/19/2015, 12:16 PM    LOS: 8 days

## 2015-06-19 NOTE — Progress Notes (Signed)
Per MD, Pt not ready for d/c today.  Amanda Lyzbeth Genrich, LCSW Clinical Social Work [phone]  

## 2015-06-19 NOTE — Progress Notes (Signed)
2040 report called to Jim,RN. Pt. transported by staff with son accompanying. Denies pain or discomfort. All belongings with pt. Cardizem drip remains @ 5gtts.

## 2015-06-19 NOTE — Progress Notes (Signed)
Patient was placed on 2L O2 around 0009 since patient's O2 desat to 88 and noted confused and very anxious.  O2Sat at 94% on 2L Reoriented patient to room, time and advise to take deep breath, relax and close both eyes. Administered PRN Valium  tab around 0205 since patient still noted anxious and looking for his son. Administered PRN Robitussin 5mL due to intermittent cough with tan sputum.  Will monitor closely since patient tend to remove [place] when wiping nose with tissue and tend to forget to put back on the . Administered PRN Tramadol 50 mg tab for left hip pain especially when coughing.

## 2015-06-19 NOTE — Progress Notes (Signed)
Patient HR decreased after Metoprolol dose, but has increased to 140's again, sustained.  Still in atrial fib on the monitor.  Dr. Randol Kern notified.  Transfer orders received for stepdown bed.  Secretary made bed request.  Will call [facility] to assist with Cardizem drip until bed is assigned.

## 2015-06-19 NOTE — Progress Notes (Signed)
Called to start cardizem drip.  PI{V inserted in to left FA #24 on 1st attempt, successful.  HR 130's. BP 124/88, SPo2 95% on 1 lpm nasal cannula.  After bolus of  IV HR decreased to 84, BP 109/77.  Patient tolerating well.

## 2015-06-20 DIAGNOSIS — I480 Paroxysmal atrial fibrillation: Secondary | ICD-10-CM

## 2015-06-20 LAB — BASIC METABOLIC PANEL
ANION GAP: 11 (ref 5–15)
BUN: 17 mg/dL (ref 6–20)
CHLORIDE: 100 mmol/L — AB (ref 101–111)
CO2: 21 mmol/L — AB (ref 22–32)
Calcium: 8.5 mg/dL — ABNORMAL LOW (ref 8.9–10.3)
Creatinine, Ser: 0.68 mg/dL (ref 0.44–1.00)
GFR calc Af Amer: >60 mL/min (ref >60)
GFR calc non Af Amer: >60 mL/min (ref >60)
GLUCOSE: 112 mg/dL — AB (ref 65–99)
POTASSIUM: 4.3 mmol/L (ref 3.5–5.1)
Sodium: 132 mmol/L — ABNORMAL LOW (ref 135–145)

## 2015-06-20 LAB — CBC
HEMATOCRIT: 32.4 % — AB (ref 36.0–46.0)
HEMOGLOBIN: 11 g/dL — AB (ref 12.0–15.0)
MCH: 30.7 pg (ref 26.0–34.0)
MCHC: 34 g/dL (ref 30.0–36.0)
MCV: 90.5 fL (ref 78.0–100.0)
Platelets: 205 10*3/uL (ref 150–400)
RBC: 3.58 MIL/uL — AB (ref 3.87–5.11)
RDW: 15.1 % (ref 11.5–15.5)
WBC: 11.8 10*3/uL — AB (ref 4.0–10.5)

## 2015-06-20 LAB — MRSA PCR SCREENING: MRSA by PCR: NEGATIVE

## 2015-06-20 MED ORDER — HEPARIN SODIUM (PORCINE) 5000 UNIT/ML IJ SOLN
5000.0000 [IU] | Freq: Two times a day (BID) | INTRAMUSCULAR | Status: DC
  Administered 2015-06-20 – 2015-06-22 (×5): 5000 [IU] via SUBCUTANEOUS
  Filled 2015-06-20 (×6): qty 1

## 2015-06-20 MED ORDER — METOPROLOL TARTRATE 50 MG PO TABS: 75.0000 mg | ORAL_TABLET | Freq: Two times a day (BID) | ORAL | Status: DC

## 2015-06-20 MED ORDER — METOPROLOL TARTRATE 100 MG PO TABS
100.0000 mg | ORAL_TABLET | Freq: Two times a day (BID) | ORAL | Status: DC
Start: 2015-06-20 — End: 2015-06-23
  Administered 2015-06-20 – 2015-06-22 (×5): 100 mg via ORAL
  Filled 2015-06-20 (×6): qty 1

## 2015-06-20 MED ORDER — METOPROLOL TARTRATE 25 MG PO TABS
25.0000 mg | ORAL_TABLET | Freq: Once | ORAL | Status: AC
  Administered 2015-06-20: 25 mg via ORAL
  Filled 2015-06-20: qty 1

## 2015-06-20 MED ORDER — FUROSEMIDE 10 MG/ML IJ SOLN
40.0000 mg | Freq: Once | INTRAMUSCULAR | Status: AC
  Administered 2015-06-20: 40 mg via INTRAVENOUS
  Filled 2015-06-20: qty 4

## 2015-06-20 NOTE — Progress Notes (Signed)
PROGRESS NOTE    Misty Shah ZOX:[id number] DOB: [birth year] DOA: 2015-06-27 PCP: Michiel Sites, MD  HPI/Brief narrative 82 year old female, lives alone, ambulates with the help of a walker, history of HTN, hypothyroid, anxiety, presented to [facility] ED on 2015-06-27 following a mechanical fall at home when she tried to open her refrigerator door that felt stuck. She sustained this fall on 06/02/15 but was unable to see her physician until 2015/06/27 and was "roughing it out" with the help of her son. X-ray of the hip confirmed a displaced left superior and inferior pubic rami fractures. Orthopedics consulted and recommended nonoperative management. PT evaluated and recommended SNF. Social worker consulted and awaiting SNF bed availability, prior to discharge patient had complaints of abdominal pain, CT abdomen pelvis significant for Large acute/subacute hematoma in the prevesical space subsequent to recently demonstrated displaced fractures of the left pubic rami, required 2 units PRBC transfusion, as well as hospital stay was complicated by HCAP , and developed new onset A. fib with RVR required transfer to stepdown unit for Cardizem drip.  Assessment/Plan:   Left hip superior and inferior pubic rami fracture, displaced - Orthopedic follow-up appreciated. Recommends partial weightbearing on the left, pain management, PT and OT evaluation and possible DC to SNF. - PT recommends SNF. Per orthopedics  Anemia secondary to blood loss from hematoma at left pubic ramus fracture - Patient is lying hemoglobin 12.8 as an outpatient, was 10.1 on admission, 7.3 on 1/19, transfused 2 units PRBC 1/19, hemoglobin been stable since - We'll resume subcutaneous heparin for DVT prophylaxis, monitor hemoglobin closely  Hypoxic respiratory failure/HCAP - Patient with hypoxia, tachypnea and  respiratory distress 1/21, chest x-ray showing by apical infiltrate, low-grade temperature, tachycardia, tachypnea and  leukocytosis. -  started on IV vancomycin and Zosyn for HCAP 1/21. - Continue with incentive spirometry and flutter valve, continue with pulmonary toilet RT consulted for chest PT. - Continue with gentle diuresis.  New Onset A. Fib - Patient was noticed to be tachycardic, was in A. fib with RVR on telemetry,CHADS2VASC2 score >2, but not a  candidate for anticoagulation giving her recent fall and hematoma. - Required Cardizem drip initially, currently rate controlled on metoprolol.  Klebsiella pneumoniae UTI - Confirmed on urine culture. Patient denies dysuria. Completed 3 days of oral Ceftin.  Hyponatremia - Secondary to volume depletion, improved with volume resuscitation  Essential hypertension - Acceptable, continue with metoprolol.  Dehydration - Resolved after IV fluids. DC IV fluids. Resumed diet.  Hypothyroid - Continue Synthroid.  Anxiety - When necessary Valium  RBBB - Not new.  Anemia - Stable    DVT prophylaxis: [place] heparin Code Status: Full Family Communication: D/E son VIA phone 1/21 Disposition Plan: DC to SNF when respiratory status improves    Consultants:  Orthopedics  Procedures:  Foley- DC'ed 1/15  Transfused 2 units PRBC  Antimicrobials:  None   Subjective: Patient states that her left hip pain continues to improve.  Objective: Filed Vitals:   06/20/15 0900 06/20/15 1000 06/20/15 1050 06/20/15 1100  BP: 128/71 127/67 120/54 132/57  Pulse: 90 78 75 48  Temp:    97 F (36.1 C)  TempSrc:    Oral  Resp: 32 Height:      Weight:      SpO2: 98% 95% 95% 92%    Intake/Output Summary (Last 24 hours) at 06/20/15 1435 Last data filed at 06/20/15 1017  Gross per 24 hour  Intake 766.92 ml  Output  150 ml  Net 616.92 ml   Filed Weights   07-08-15 2145  Weight: 51.5 kg (113 lb 8.6 oz)    Exam:  General exam: Pleasant elderly frail female patient lying comfortably in bed. Respiratory system: Clear. No wheezing, good  air entry bilaterally, no wheezing. Cardiovascular system: S1 & S2 heard, irregular. No JVD, murmurs, gallops, clicks or pedal edema. Gastrointestinal system: Abdomen is nondistended, soft , minimal diffuse tenderness. Normal bowel sounds heard. Central nervous system: Alert and oriented. No focal neurological deficits. Extremities: Symmetric 5 x 5 power. Minimal bruising over left hip & left groin site.   Data Reviewed: Basic Metabolic Panel:  Recent Labs Lab 06/16/15 2302 06/17/15 0524 06/18/15 0504 06/20/15 0625  NA 129* 131* 133* 132*  K 4.3 4.5 3.6 4.3  CL 94* 100* 102 100*  CO2 21*  GLUCOSE 152* 115* 108* 112*  BUN CREATININE 0.97 0.88 0.61 0.68  CALCIUM 8.4* 8.0* 8.0* 8.5*   Liver Function Tests: No results for input(s): AST, ALT, ALKPHOS, BILITOT, PROT, ALBUMIN in the last 168 hours. No results for input(s): LIPASE, AMYLASE in the last 168 hours. No results for input(s): AMMONIA in the last 168 hours. CBC:  Recent Labs Lab 06/16/15 2302 06/17/15 0659 06/18/15 0504 06/19/15 0518 06/20/15 0625  WBC 9.6 6.5 6.6 12.1* 11.8*  HGB 8.1* 7.3* 10.5* 11.6* 11.0*  HCT 24.7* 22.5* 30.6* 34.1* 32.4*  MCV 91.1 92.6 89.0 89.3 90.5  PLT 225 211 163 203 205   Cardiac Enzymes: No results for input(s): CKTOTAL, CKMB, CKMBINDEX, TROPONINI in the last 168 hours. BNP (last 3 results) No results for input(s): PROBNP in the last 8760 hours. CBG: No results for input(s): GLUCAP in the last 168 hours.  Recent Results (from the past 240 hour(s))  Culture, Urine     Status: None   Collection Time: 07/08/15  8:12 PM  Result Value Ref Range Status   Specimen Description URINE, RANDOM  Final   Special Requests NONE  Final   Culture >=100,000 COLONIES/mL KLEBSIELLA PNEUMONIAE  Final   Report Status 06/14/2015 FINAL  Final   Organism ID, Bacteria KLEBSIELLA PNEUMONIAE  Final      Susceptibility   Klebsiella pneumoniae - MIC*    AMPICILLIN 16 RESISTANT  Resistant     CEFAZOLIN <=4 SENSITIVE Sensitive     CEFTRIAXONE <=1 SENSITIVE Sensitive     CIPROFLOXACIN <=0.25 SENSITIVE Sensitive     GENTAMICIN <=1 SENSITIVE Sensitive     IMIPENEM <=0.25 SENSITIVE Sensitive     NITROFURANTOIN 32 SENSITIVE Sensitive     TRIMETH/SULFA <=20 SENSITIVE Sensitive     AMPICILLIN/SULBACTAM <=2 SENSITIVE Sensitive     PIP/TAZO <=4 SENSITIVE Sensitive     * >=100,000 COLONIES/mL KLEBSIELLA PNEUMONIAE  MRSA PCR Screening     Status: None   Collection Time: 06/20/15 12:54 AM  Result Value Ref Range Status   MRSA by PCR NEGATIVE NEGATIVE Final    Comment:        The GeneXpert MRSA Assay (FDA approved for NASAL specimens only), is one component of a comprehensive MRSA colonization surveillance program. It is not intended to diagnose MRSA infection nor to guide or monitor treatment for MRSA infections.          Studies: Dg Chest Port 1 View  06/19/2015  CLINICAL DATA:  Shortness of breath for 1 week EXAM: PORTABLE CHEST - 1 VIEW COMPARISON:  2015/07/08 FINDINGS: Cardiac shadow is stable. Biapical infiltrates are  noted right greater than left new from the prior exam. No sizable effusion is seen. No acute bony abnormality is noted. IMPRESSION: Biapical infiltrates right greater than left. Electronically Signed   By: Alcide Clever M.D.   On: 06/19/2015 08:44        Scheduled Meds: . sodium chloride   Intravenous Once  . feeding supplement  1 Container Oral Q24H  . heparin subcutaneous  5,000 Units Subcutaneous 3 times per day  . levothyroxine  100 mcg Oral QAC breakfast  . metoprolol  75 mg Oral BID  . piperacillin-tazobactam (ZOSYN)  IV  3.375 g Intravenous Q8H  . vancomycin  500 mg Intravenous Q24H   Continuous Infusions: . diltiazem (CARDIZEM) infusion Stopped (06/20/15 1017)    Active Problems:   Hypertension   Hypothyroidism   Fracture of multiple pubic rami (HCC)   Dehydration   Pressure ulcer   Acute blood loss anemia    Hyponatremia   Hypoxia   Atrial fibrillation (HCC)   HCAP (healthcare-associated pneumonia)    Time spent: 25 minutes    Autry Prust, MD. [facility] Pager [phone]  If 7PM-7AM, please contact night-coverage [email or web address] Password [email or web address] 06/20/2015, 2:35 PM    LOS: 9 days

## 2015-06-21 DIAGNOSIS — L8990 Pressure ulcer of unspecified site, unspecified stage: Secondary | ICD-10-CM

## 2015-06-21 DIAGNOSIS — E876 Hypokalemia: Secondary | ICD-10-CM

## 2015-06-21 DIAGNOSIS — D62 Acute posthemorrhagic anemia: Secondary | ICD-10-CM

## 2015-06-21 DIAGNOSIS — I10 Essential (primary) hypertension: Secondary | ICD-10-CM

## 2015-06-21 DIAGNOSIS — E860 Dehydration: Secondary | ICD-10-CM

## 2015-06-21 DIAGNOSIS — I4891 Unspecified atrial fibrillation: Secondary | ICD-10-CM

## 2015-06-21 DIAGNOSIS — R0902 Hypoxemia: Secondary | ICD-10-CM

## 2015-06-21 LAB — BASIC METABOLIC PANEL
ANION GAP: 10 (ref 5–15)
BUN: 15 mg/dL (ref 6–20)
CHLORIDE: 98 mmol/L — AB (ref 101–111)
CO2: 28 mmol/L (ref 22–32)
Calcium: 8.5 mg/dL — ABNORMAL LOW (ref 8.9–10.3)
Creatinine, Ser: 0.72 mg/dL (ref 0.44–1.00)
GFR calc non Af Amer: >60 mL/min (ref >60)
Glucose, Bld: 111 mg/dL — ABNORMAL HIGH (ref 65–99)
Potassium: 3 mmol/L — ABNORMAL LOW (ref 3.5–5.1)
Sodium: 136 mmol/L (ref 135–145)

## 2015-06-21 LAB — CBC
HEMATOCRIT: 33.4 % — AB (ref 36.0–46.0)
HEMOGLOBIN: 11.3 g/dL — AB (ref 12.0–15.0)
MCH: 30.6 pg (ref 26.0–34.0)
MCHC: 33.8 g/dL (ref 30.0–36.0)
MCV: 90.5 fL (ref 78.0–100.0)
Platelets: 251 10*3/uL (ref 150–400)
RBC: 3.69 MIL/uL — ABNORMAL LOW (ref 3.87–5.11)
RDW: 15.1 % (ref 11.5–15.5)
WBC: 12.5 10*3/uL — AB (ref 4.0–10.5)

## 2015-06-21 LAB — MAGNESIUM: MAGNESIUM: 1.6 mg/dL — AB (ref 1.7–2.4)

## 2015-06-21 LAB — PHOSPHORUS: Phosphorus: 2.5 mg/dL (ref 2.5–4.6)

## 2015-06-21 MED ORDER — BOOST / RESOURCE BREEZE PO LIQD
1.0000 | Freq: Two times a day (BID) | ORAL | Status: DC
  Administered 2015-06-22 – 2015-06-23 (×3): 1 via ORAL

## 2015-06-21 MED ORDER — DILTIAZEM HCL ER COATED BEADS 120 MG PO CP24
120.0000 mg | ORAL_CAPSULE | Freq: Every day | ORAL | Status: DC
  Administered 2015-06-21 – 2015-06-22 (×2): 120 mg via ORAL
  Filled 2015-06-21 (×3): qty 1

## 2015-06-21 MED ORDER — METOPROLOL TARTRATE 1 MG/ML IV SOLN
5.0000 mg | INTRAVENOUS | Status: DC | PRN
  Administered 2015-06-21: 5 mg via INTRAVENOUS
  Filled 2015-06-21 (×2): qty 5

## 2015-06-21 MED ORDER — POTASSIUM CHLORIDE CRYS ER 20 MEQ PO TBCR
40.0000 meq | EXTENDED_RELEASE_TABLET | ORAL | Status: DC
  Administered 2015-06-21: 40 meq via ORAL
  Filled 2015-06-21: qty 2

## 2015-06-21 MED ORDER — POTASSIUM CHLORIDE 20 MEQ PO PACK
40.0000 meq | PACK | ORAL | Status: AC
  Administered 2015-06-21: 40 meq via ORAL
  Filled 2015-06-21 (×4): qty 2

## 2015-06-21 MED ORDER — K PHOS MONO-SOD PHOS DI & MONO 155-852-130 MG PO TABS
250.0000 mg | ORAL_TABLET | Freq: Two times a day (BID) | ORAL | Status: DC
  Administered 2015-06-21 – 2015-06-22 (×4): 250 mg via ORAL
  Filled 2015-06-21 (×6): qty 1

## 2015-06-21 MED ORDER — MAGNESIUM SULFATE 2 GM/50ML IV SOLN
2.0000 g | Freq: Once | INTRAVENOUS | Status: AC
  Administered 2015-06-21: 2 g via INTRAVENOUS
  Filled 2015-06-21: qty 50

## 2015-06-21 MED ORDER — FUROSEMIDE 10 MG/ML IJ SOLN
40.0000 mg | Freq: Once | INTRAMUSCULAR | Status: AC
  Administered 2015-06-21: 40 mg via INTRAVENOUS
  Filled 2015-06-21: qty 4

## 2015-06-21 NOTE — Progress Notes (Signed)
CPT not done at this time pt is resting comfortably. All vital signs within normal range. RT to monitor as needed

## 2015-06-21 NOTE — Progress Notes (Signed)
PROGRESS NOTE    Misty Shah ZHY:[id number] DOB: 12-01-1915 DOA: 06/22/2015 PCP: Michiel Sites, MD  HPI/Brief narrative 82 year old female, lives alone, ambulates with the help of a walker, history of HTN, hypothyroid, anxiety, presented to [facility] ED on 06/14/2015 following a mechanical fall at home when she tried to open her refrigerator door that felt stuck. She sustained this fall on 06/02/15 but was unable to see her physician until 06/19/2015 and was "roughing it out" with the help of her son. X-ray of the hip confirmed a displaced left superior and inferior pubic rami fractures. Orthopedics consulted and recommended nonoperative management. PT evaluated and recommended SNF. Social worker consulted and awaiting SNF bed availability, prior to discharge patient had complaints of abdominal pain, CT abdomen pelvis significant for Large acute/subacute hematoma in the prevesical space subsequent to recently demonstrated displaced fractures of the left pubic rami, required 2 units PRBC transfusion, as well as hospital stay was complicated by HCAP , and developed new onset A. fib with RVR required transfer to stepdown unit for Cardizem drip.  Assessment/Plan:   Left hip superior and inferior pubic rami fracture, displaced - Orthopedic follow-up appreciated. Recommends partial weightbearing on the left, pain management, PT and OT evaluation and possible DC to SNF. - PT recommends SNF. Per orthopedics  Anemia secondary to blood loss from hematoma at left pubic ramus fracture - Patient is lying hemoglobin 12.8 as an outpatient, was 10.1 on admission, 7.3 on 1/19, transfused 2 units PRBC 1/19, hemoglobin been stable since - resumed subcutaneous heparin for DVT prophylaxis, monitor hemoglobin closely  Hypoxic respiratory failure/HCAP - Patient with hypoxia, tachypnea and  respiratory distress 1/21, chest x-ray showing by apical infiltrate, low-grade temperature, tachycardia, tachypnea and leukocytosis. -   started on IV vancomycin and Zosyn for HCAP 1/21. - Continue with incentive spirometry and flutter valve, continue with pulmonary toiland chest PT - Continue with gentle diuresis.  New Onset A. Fib - Patient was noticed to be tachycardic, was in A. fib with RVR on telemetry,CHADS2VASC2 score >2, but not a  candidate for anticoagulation giving her recent fall and hematoma. - Required Cardizem drip initially,In addition to by mouth metoprolol,  required when necessary IV metoprolol overnight , did Cardizem CD , requested cardiology consult .  Klebsiella pneumoniae UTI - Confirmed on urine culture. Patient denies dysuria. Completed 3 days of oral Ceftin.  Hyponatremia -resolved  Essential hypertension - Acceptable, continue with metoprolol.  Dehydration - Resolved after IV fluids. DC IV fluids. Resumed diet.  Hypothyroid - Continue Synthroid.  Anxiety - When necessary Valium  RBBB - Not new.  Hypokalemia/hypomagnesemia - Repleted, recheck in a.m. phosphorus is borderline, will start on by mouth supplement.    DVT prophylaxis: [place] heparin Code Status: Full Family Communication: D/E son VIA phone 1/21 Disposition Plan: DC to SNF when respiratory status improves    Consultants:  Orthopedics  Procedures:  Foley- DC'ed 1/15  Transfused 2 units PRBC  Antimicrobials:  None   Subjective: Patient states that her left hip is controlled, reports weak cough . Subjective: Filed Vitals:   06/21/15 0810 06/21/15 0824 06/21/15 0907 06/21/15 1149  BP:      Pulse:   95   Temp:  97.9 F (36.6 C)  97.8 F (36.6 C)  TempSrc:  Oral  Oral  Resp:   29   Height:      Weight:      SpO2: 91%  97%     Intake/Output Summary (Last 24 hours) at 06/21/15  1215 Last data filed at 06/21/15 0600  Gross per 24 hour  Intake    370 ml  Output    575 ml  Net   -205 ml   Filed Weights   2015/06/12 2145  Weight: 51.5 kg (113 lb 8.6 oz)    Exam:  General exam: Pleasant elderly  frail female patient lying comfortably in bed. Respiratory system: Clear. No wheezing, good air entry bilaterally, coarse bilaterally. Cardiovascular system: S1 & S2 heard, irregular. No JVD, murmurs, gallops, clicks or pedal edema. Gastrointestinal system: Abdomen is nondistended, soft , minimal diffuse tenderness. Normal bowel sounds heard. Central nervous system: Alert and oriented. No focal neurological deficits. Extremities: Symmetric 5 x 5 power. Minimal bruising over left hip & left groin site.   Data Reviewed: Basic Metabolic Panel:  Recent Labs Lab 06/16/15 2302 06/17/15 0524 06/18/15 0504 06/20/15 0625 06/21/15 0353  NA 129* 131* 133* 132* 136  K 4.3 4.5 3.6 4.3 3.0*  CL 94* 100* 102 100* 98*  CO2 21* 28  GLUCOSE 152* 115* 108* 112* 111*  BUN CREATININE 0.97 0.88 0.61 0.68 0.72  CALCIUM 8.4* 8.0* 8.0* 8.5* 8.5*  MG  --   --   --   --  1.6*  PHOS  --   --   --   --  2.5   Liver Function Tests: No results for input(s): AST, ALT, ALKPHOS, BILITOT, PROT, ALBUMIN in the last 168 hours. No results for input(s): LIPASE, AMYLASE in the last 168 hours. No results for input(s): AMMONIA in the last 168 hours. CBC:  Recent Labs Lab 06/17/15 0659 06/18/15 0504 06/19/15 0518 06/20/15 0625 06/21/15 0353  WBC 6.5 6.6 12.1* 11.8* 12.5*  HGB 7.3* 10.5* 11.6* 11.0* 11.3*  HCT 22.5* 30.6* 34.1* 32.4* 33.4*  MCV 92.6 89.0 89.3 90.5 90.5  PLT 211 163 203 205 251   Cardiac Enzymes: No results for input(s): CKTOTAL, CKMB, CKMBINDEX, TROPONINI in the last 168 hours. BNP (last 3 results) No results for input(s): PROBNP in the last 8760 hours. CBG: No results for input(s): GLUCAP in the last 168 hours.  Recent Results (from the past 240 hour(s))  Culture, Urine     Status: None   Collection Time: 2015-06-12  8:12 PM  Result Value Ref Range Status   Specimen Description URINE, RANDOM  Final   Special Requests NONE  Final   Culture >=100,000  COLONIES/mL KLEBSIELLA PNEUMONIAE  Final   Report Status 06/14/2015 FINAL  Final   Organism ID, Bacteria KLEBSIELLA PNEUMONIAE  Final      Susceptibility   Klebsiella pneumoniae - MIC*    AMPICILLIN 16 RESISTANT Resistant     CEFAZOLIN <=4 SENSITIVE Sensitive     CEFTRIAXONE <=1 SENSITIVE Sensitive     CIPROFLOXACIN <=0.25 SENSITIVE Sensitive     GENTAMICIN <=1 SENSITIVE Sensitive     IMIPENEM <=0.25 SENSITIVE Sensitive     NITROFURANTOIN 32 SENSITIVE Sensitive     TRIMETH/SULFA <=20 SENSITIVE Sensitive     AMPICILLIN/SULBACTAM <=2 SENSITIVE Sensitive     PIP/TAZO <=4 SENSITIVE Sensitive     * >=100,000 COLONIES/mL KLEBSIELLA PNEUMONIAE  MRSA PCR Screening     Status: None   Collection Time: 06/20/15 12:54 AM  Result Value Ref Range Status   MRSA by PCR NEGATIVE NEGATIVE Final    Comment:        The GeneXpert MRSA Assay (FDA approved for NASAL specimens only), is one component of a  comprehensive MRSA colonization surveillance program. It is not intended to diagnose MRSA infection nor to guide or monitor treatment for MRSA infections.          Studies: No results found.      Scheduled Meds: . sodium chloride   Intravenous Once  . diltiazem  120 mg Oral Daily  . feeding supplement  1 Container Oral BID BM  . heparin subcutaneous  5,000 Units Subcutaneous Q12H  . levothyroxine  100 mcg Oral QAC breakfast  . metoprolol  100 mg Oral BID  . phosphorus  250 mg Oral BID  . piperacillin-tazobactam (ZOSYN)  IV  3.375 g Intravenous Q8H  . potassium chloride  40 mEq Oral Q4H  . vancomycin  500 mg Intravenous Q24H   Continuous Infusions:    Active Problems:   Hypertension   Hypothyroidism   Fracture of multiple pubic rami (HCC)   Dehydration   Pressure ulcer   Acute blood loss anemia   Hyponatremia   Hypoxia   Atrial fibrillation (HCC)   HCAP (healthcare-associated pneumonia)    Time spent: 35 minutes    ELGERGAWY, DAWOOD, MD. [facility] Pager  [phone]  If 7PM-7AM, please contact night-coverage [email or web address] Password [email or web address] 06/21/2015, 12:15 PM    LOS: 10 days

## 2015-06-21 NOTE — Consult Note (Signed)
CARDIOLOGY CONSULT NOTE   Patient ID: Misty Shah MRN: [id number] DOB/AGE: [birth year] 82 y.o.  Admit date: 06/17/2015  Primary Physician   Michiel Sites, MD Primary Cardiologist   New (Dr. Herbie Baltimore) Reason for Consultation   New Onset Atrial Fibrillation Requesting Physician Internal Medicine  HPI: Misty Shah is a 82 y.o. female with a history of HTN, hypothyroid, anxiety, presented to [facility] ED on 06/05/2015 following a mechanical fall at home when she tried to open her refrigerator door that felt stuck. She sustained this fall on 06/02/15 but was unable to see her physician until 06/22/2015 and was "roughing it out" with the help of her son. X-ray of the hip confirmed a displaced left superior and inferior pubic rami fractures. Orthopedics consulted and recommended nonoperative management. PT evaluated and recommended SNF. Social worker consulted and awaiting SNF bed availability, prior to discharge patient had complaints of abdominal pain, CT abdomen pelvis significant for Large acute/subacute hematoma in the prevesical space subsequent to recently demonstrated displaced fractures of the left pubic rami., pt required 2 units PRBC transfusion. Hospital stay was further complicated by HCAP and development of new onset A. fib with RVR Pt was transferred to stepdown unit for Cardizem drip. Patient currently on Cardizem 120mg  PO daily, metoprolol 100mg  PO BID for rate control. Cardiology consulted for further evaluation of atrial fibrillation management.   Today, patient states she is doing okay but is upset that she is not able to go home today. Notes that her hip is still causing her pain and she continues to have a cough and shortness of breath with exertion. Denies any chest pain, palpitations, edema, or orthopnea.   Past Medical History  Diagnosis Date  . Hypertension   . Hypothyroidism   . UTI (lower urinary tract infection)   . Anxiety   . Claustrophobia   . Dislocated shoulder     hx  bilateral shoulders - had therapy on them  . Shingles   . Fracture of ramus of left pubis (HCC) 05/2015    displaced left superior and inferior pubic rami fractures.  . Macular degeneration of both eyes   . Arthritis     "all over"     Past Surgical History  Procedure Laterality Date  . Cholecystectomy    . Thyroidectomy    . Tonsillectomy    . Appendectomy    . Hip fracture surgery Right 10/2001    Manipulation and compression screw fixation, right hip fracture/notes 10/11/2010  . Colonoscopy    . Cataract extraction, bilateral    . Femur im nail  09/25/2011    Procedure: INTRAMEDULLARY (IM) NAIL FEMORAL;  Surgeon: Valeria Batman, MD;  Location: [facility] ORS;  Service: Orthopedics;  Laterality: Left;  left hip   . Fracture surgery    . Cataract extraction w/ intraocular lens  implant, bilateral Bilateral     Allergies  Allergen Reactions  . Codeine Other (See Comments)    unknown  . Epinephrine Other (See Comments)    unknown  . Sulfur Other (See Comments)    unknown  . Tetanus Toxoids Other (See Comments)    unknown    I have reviewed the patient's current medications . sodium chloride   Intravenous Once  . diltiazem  120 mg Oral Daily  . feeding supplement  1 Container Oral BID BM  . heparin subcutaneous  5,000 Units Subcutaneous Q12H  . levothyroxine  100 mcg Oral QAC breakfast  . metoprolol  100 mg Oral BID  .  phosphorus  250 mg Oral BID  . piperacillin-tazobactam (ZOSYN)  IV  3.375 g Intravenous Q8H  . potassium chloride  40 mEq Oral Q4H  . vancomycin  500 mg Intravenous Q24H     bisacodyl, diazepam, guaiFENesin, metoprolol, morphine injection, traMADol  Prior to Admission medications   Medication Sig Start Date End Date Taking? Authorizing Provider  amLODipine (NORVASC) 5 MG tablet Take 5 mg by mouth daily.   Yes Historical Provider, MD  aspirin EC 81 MG tablet Take 81 mg by mouth daily.   Yes Historical Provider, MD  furosemide (LASIX) 80 MG tablet Take 80 mg  by mouth daily.   Yes Historical Provider, MD  levothyroxine (SYNTHROID, LEVOTHROID) 100 MCG tablet Take 100 mcg by mouth daily.   Yes Historical Provider, MD  metoprolol (LOPRESSOR) 100 MG tablet Take 100 mg by mouth 2 (two) times daily.   Yes Historical Provider, MD  Polyethyl Glycol-Propyl Glycol (SYSTANE FREE OP) Place 1 drop into both eyes daily as needed.   Yes Historical Provider, MD  diazepam (VALIUM) 2 MG tablet Take 1 tablet (2 mg total) by mouth every 6 (six) hours as needed for anxiety. 06/16/15   Leana Roe Elgergawy, MD  diclofenac sodium (VOLTAREN) 1 % GEL Apply 1 application topically 4 (four) times daily. Patient not taking: Reported on June 23, 2015 10/13/11   Mcarthur Rossetti Angiulli, PA-C  enoxaparin (LOVENOX) 30 MG/0.3ML injection Inject 0.3 mLs (30 mg total) into the skin daily. This take total of 4 weeks then stop 06/16/15   Starleen Arms, MD  feeding supplement, ENSURE ENLIVE, (ENSURE ENLIVE) LIQD Take 237 mLs by mouth daily at 3 pm. 06/16/15   Starleen Arms, MD  furosemide (LASIX) 20 MG tablet Take 1 tablet (20 mg total) by mouth daily. 06/16/15   Starleen Arms, MD  HYDROcodone-acetaminophen (NORCO/VICODIN) 5-325 MG per tablet Take 1 or 2 po Q 6hrs for pain Patient not taking: Reported on 23-Jun-2015 09/15/12   Devoria Albe, MD  metoprolol tartrate (LOPRESSOR) 25 MG tablet Take 0.5 tablets (12.5 mg total) by mouth 2 (two) times daily. 06/16/15   Leana Roe Elgergawy, MD  potassium chloride (K-DUR,KLOR-CON) 10 MEQ tablet Take 1 tablet (10 mEq total) by mouth daily. 06/16/15   Leana Roe Elgergawy, MD  senna-docusate (SENOKOT-S) 8.6-50 MG tablet Take 1 tablet by mouth 2 (two) times daily. 06/16/15   Leana Roe Elgergawy, MD  traMADol (ULTRAM) 50 MG tablet Take 1 tablet (50 mg total) by mouth every 6 (six) hours as needed. For pain relief 06/16/15   Starleen Arms, MD     Social History   Social History  . Marital Status: Widowed    Spouse Name: N/A  . Number of Children: N/A  . Years  of Education: N/A   Occupational History  . Not on file.   Social History Main Topics  . Smoking status: Never Smoker   . Smokeless tobacco: Never Used  . Alcohol Use: No  . Drug Use: No  . Sexual Activity: No   Other Topics Concern  . Not on file   Social History Narrative    No family status information on file.   History reviewed. No pertinent family history.   Add family hx - The patient is unable to provide any information regarding family hx.   ROS:  Full 14 point review of systems complete and found to be negative unless listed above.  Physical Exam: Blood pressure 138/84, pulse 95, temperature 97.8 F (36.6 C), temperature  source Oral, resp. rate 29, height 5' (1.524 m), weight 113 lb 8.6 oz (51.5 kg), SpO2 97 %.  General: Well developed female in no acute distress, appears frail.  Head: Eyes PERRLA, No xanthomas. Normocephalic and atraumatic, oropharynx without edema or exudate.  Lungs: Resp regular and unlabored. Rhonchi auscultated b/l throughout lung fields, cleared with coughing. Coarse breath sounds noted b/l.   Heart:Irregular, no s3, s4, or murmurs..   Neck: No carotid bruits. No lymphadenopathy.  JVD. Abdomen: Bowel sounds present, abdomen soft and non-tender without masses or hernias noted. Msk:  No spine or cva tenderness. No weakness, no joint deformities or effusions. Extremities: No clubbing, cyanosis or edema. DP/PT/Radials 2+ and equal bilaterally. Neuro: Alert and oriented. No focal deficits noted. Psych:  Good affect, responds appropriately Skin: No rashes or lesions noted on limited skin exam.   Labs:   Lab Results  Component Value Date   WBC 12.5* 06/21/2015   HGB 11.3* 06/21/2015   HCT 33.4* 06/21/2015   MCV 90.5 06/21/2015   PLT 251 06/21/2015   No results for input(s): INR in the last 72 hours.  Recent Labs Lab 06/21/15 0353  NA 136  K 3.0*  CL 98*  CO2 28  BUN 15  CREATININE 0.72  CALCIUM 8.5*  GLUCOSE 111*   MAGNESIUM    Date Value Ref Range Status  06/21/2015 1.6* 1.7 - 2.4 mg/dL Final   No results for input(s): CKTOTAL, CKMB, TROPONINI in the last 72 hours. No results for input(s): TROPIPOC in the last 72 hours. No results found for: PROBNP No results found for: CHOL, HDL, LDLCALC, TRIG No results found for: DDIMER No results found for: LIPASE, AMYLASE TSH  Date/Time Value Ref Range Status  09/25/2011 05:20 PM 1.806 0.350 - 4.500 uIU/mL Final   No results found for: VITAMINB12, FOLATE, FERRITIN, TIBC, IRON, RETICCTPCT   ECG: 06/13/15 NSR, moderate artifact   Tele: When ambulating, tele showed atrial fibrillation with HR in 100-110s. While resting, tele showed a fib with HR in 80-90s.   Radiology:  No results found.  ASSESSMENT AND PLAN:    Active Problems:   Hypertension   Hypothyroidism   Fracture of multiple pubic rami (HCC)   Dehydration   Pressure ulcer   Acute blood loss anemia   Hyponatremia   Hypoxia   Atrial fibrillation (HCC)   HCAP (healthcare-associated pneumonia)    1. Left hip superior and inferior pubic rami fracture, displaced - Orthopedic recommends partial weightbearing on the left, pain management, PT and OT evaluation and possible DC to SNF. - PT recommends SNF. Per orthopedics  2. Anemia secondary to blood loss from hematoma at left pubic ramus fracture - Patient is lying hemoglobin 12.8 as an outpatient, was 10.1 on admission, 7.3 on 1/19, transfused 2 units PRBC 1/19, hemoglobin been stable since, 11.3 today.  - resumed subcutaneous heparin for DVT prophylaxis, monitor hemoglobin closely  3. Hypoxic respiratory failure/HCAP - Patient with hypoxia, tachypnea and respiratory distress 1/21, chest x-ray showing by apical infiltrate, low-grade temperature, tachycardia, tachypnea and leukocytosis. - started on IV vancomycin and Zosyn for HCAP 1/21. -Per IM  4. New Onset A. Fib with RVR - Patient was noticed to be tachycardic, was in A. fib with RVR on  telemetry,CHADS2VASC2 of 4 (HTN, Age > 5, Female) but not acandidate for anticoagulation giving her recent fall and hematoma. - Required Cardizem drip initially,has since been started on Cardizem  PO daily and metoprolol  PO BID for rate control. HR continues  to elevate to 110s when patient ambulates,at rest HR is 80-90. Continue to monitor HR and BP.   5. Klebsiella pneumoniae UTI - Confirmed on urine culture. Patient denies dysuria. Completed 3 days of oral Ceftin. -Per IM    6. Essential hypertension - Acceptable, continue with metoprolol. Last BP reading 138/87.   7. Hypothyroid - Continue Synthroid. -Per IM   8. Anxiety - When necessary Valium -Per IM   9. RBBB - Not new.  10. Hypokalemia/hypomagnesemia -Today, K is 3.0, down from yesterday's (06/20/15) 4.3, need to replete  -Mg today is 1.6, need to replete.   -Monitor values in the AM  Signed: Benjiman Core, Student-PA 06/21/2015, 12:18 PM   Discussed with PA-student patient case. Patient has also been seen and examined by Dr. Herbie Baltimore. Agree with note as written. She remains in atrial fibrillation. Resting rate is in the 90s on PO Cardizem and metoprolol. Suspect some of her other underlying medical issues may be exacerbating her arrhthymia and heart rate. K is 3.0. Mg is 1.6, both of which need repletion. Supplement K and Mg and monitor. Also continue treatment of her PNA, anemia and UTI. She also has a h/o hypothyroidism, on Synthroid. Will check a TSH to make sure she is not being over-treated. No anticoagulation given recent fall and hematoma. Dr. Herbie Baltimore to follow with further recommendations.   SIMMONS, BRITTAINY 06/21/2015  I have seen, examined and evaluated the patient this AM along with both Robbie Lis, PA-C & Benjiman Core, [place] .  After reviewing all the available data and chart,  I agree with their findings, examination as well as impression recommendations.  Very pleasant -  somewhat confused elderly lady with new onset Afib with RVR following fall with hip fxr. We are consulted for assistance with Afib HR control..   Currently, on my exam, her HR is borderline elevated, but does go up with activity -- I suspect that pain & confusion/anxiety is playing a role in rate control.    Agree with checking the noted labs.  For now, I thing rate control is the best option in the absence of CHF Sx. -- continue BB & slowly titrate up CCB dose.  Given her age & recent fall, as well as obvious immobility, I am concerned of her risk for bleeding if we were toconsider anticoagulation -- would use ASA +/- Plavix, but would not consider NOAC or Heparin-Warfarin.   Treat underlying cause for RVR => UT / PNAI, pain & anxiety control & HR will likely respond.  For now, I agree with BB & CCB - rate is relatively well controlled - can reassess in AM  With her recent fall & now progressed immobility, there is concern for recurrent falls - would not consider her an ideal anticoagulation candidate.   -- would not initiate anticoagulation.    Marykay Lex, M.D., M.S. Interventional Cardiologist   Pager # [phone] Phone # [phone] [address] [place], [place] [address]

## 2015-06-21 NOTE — Progress Notes (Signed)
PT hr trending up 120's 130's, no prn's paged NP new orders received .

## 2015-06-21 NOTE — Care Management Note (Addendum)
Case Management Note  Patient Details  Name: Misty Shah MRN: [id number] Date of Birth: 1916-04-21  Subjective/Objective:     Patient is from home alone, plan at dc is to go to [facility], CSW following.                Action/Plan: Patient is s/p fall at home, has hematoma, pna,uti, new afib , was on cardizem gtt, now po cardizem with metoprolol for rate control.  HR continues to elvate to 110's.   Expected Discharge Date:                  Expected Discharge Plan:  Skilled Nursing Facility  In-House Referral:  Clinical Social Work  Discharge planning Services  CM Consult  Post Acute Care Choice:    Choice offered to:     DME Arranged:    DME Agency:     HH Arranged:    HH Agency:     Status of Service:  Completed, signed off  Medicare Important Message Given:  Yes Date Medicare IM Given:    Medicare IM give by:    Date Additional Medicare IM Given:    Additional Medicare Important Message give by:     If discussed at Long Length of Stay Meetings, dates discussed:    Additional Comments:  Leone Haven, RN 06/21/2015, 1:43 PM

## 2015-06-21 NOTE — Progress Notes (Signed)
Physical Therapy Treatment Patient Details Name: Misty Shah MRN: [id number] DOB: 1916/04/14 Today's Date: 06/21/2015    History of Present Illness Admitted post fall resulting in L inferior and superior pubic rami; Had been trying to manage at son's home for a few days PTA as inclement weather kept her from the doctor's office;  has a past medical history of Hypertension; Hypothyroidism; UTI (lower urinary tract infection); Anxiety; Claustrophobia; Dislocated shoulder; Shingles; Fracture of ramus of left pubis (HCC) (05/2015); Macular degeneration of both eyes; and Arthritis.;  has past surgical history that includes Hip fracture surgery (Right, 10/2001);   Femur IM nail (09/25/2011)  Patient went into A-Fib w/RVR and transferred to SDU.    PT Comments    Patient with functional decline with recent changes in heart rhythm as well as seems more confused.  Patient needed cues for [facility] throughout ambulation and increased assist needed for safety with posterior lean, difficulty managing walker and noted decreased O2 sats with ambulation into mid 80's while on 4L O2.  Will need SNF level rehab at d/c.  Follow Up Recommendations  SNF     Equipment Recommendations  Rolling walker with 5" wheels;3in1 (PT)    Recommendations for Other Services       Precautions / Restrictions Precautions Precautions: Fall Restrictions Weight Bearing Restrictions: Yes LLE Weight Bearing: Partial weight bearing    Mobility  Bed Mobility               General bed mobility comments: up in chair   Transfers   Equipment used: Rolling walker (2 wheeled) Transfers: Sit to/from Stand Sit to Stand: Mod assist         General transfer comment: lifting and lowering assist with posterior bias  Ambulation/Gait Ambulation/Gait assistance: Mod assist Ambulation Distance (Feet): 10 Feet Assistive device: Rolling walker (2 wheeled) Gait Pattern/deviations: Step-to pattern;Decreased step length -  right;Decreased stance time - left;Antalgic     General Gait Details: Cues each step to maintain PWB L; posterior bias throughout and assist for safety with turns   Stairs            Wheelchair Mobility    Modified Rankin (Stroke Patients Only)       Balance Overall balance assessment: Needs assistance       Postural control: Posterior lean Standing balance support: Bilateral upper extremity supported                        Cognition Arousal/Alertness: Awake/alert Behavior During Therapy: WFL for tasks assessed/performed Overall Cognitive Status: Impaired/Different from baseline Area of Impairment: Orientation;Memory;Safety/judgement;Problem solving Orientation Level: Disoriented to;Time   Memory: Decreased short-term memory   Safety/Judgement: Decreased awareness of deficits;Decreased awareness of safety   Problem Solving: Requires verbal cues;Decreased initiation      Exercises      General Comments        Pertinent Vitals/Pain Faces Pain Scale: Hurts little more Pain Location: L LE with weight bearing Pain Descriptors / Indicators: Sore Pain Intervention(s): Monitored during session;Repositioned    Home Living                      Prior Function            PT Goals (current goals can now be found in the care plan section) Progress towards PT goals: Not progressing toward goals - comment (due to medical issues)    Frequency  Min 3X/week    PT Plan  Current plan remains appropriate    Co-evaluation             End of Session Equipment Utilized During Treatment: Gait belt;Oxygen Activity Tolerance: Patient limited by fatigue;Patient limited by pain Patient left: in chair;with call bell/phone within reach     Time: 1147-1208 PT Time Calculation (min) (ACUTE ONLY): 21 min  Charges:  $Gait Training: 8-22 mins                    G Codes:      [name] 07/11/2015, 12:29 PM  [name],  PT [phone] 11-Jul-2015

## 2015-06-22 LAB — BASIC METABOLIC PANEL
Anion gap: 9 (ref 5–15)
BUN: 14 mg/dL (ref 6–20)
CALCIUM: 8.3 mg/dL — AB (ref 8.9–10.3)
CO2: 32 mmol/L (ref 22–32)
CREATININE: 0.77 mg/dL (ref 0.44–1.00)
Chloride: 97 mmol/L — ABNORMAL LOW (ref 101–111)
Glucose, Bld: 129 mg/dL — ABNORMAL HIGH (ref 65–99)
Potassium: 2.9 mmol/L — ABNORMAL LOW (ref 3.5–5.1)
SODIUM: 138 mmol/L (ref 135–145)

## 2015-06-22 LAB — CBC
HCT: 35.2 % — ABNORMAL LOW (ref 36.0–46.0)
Hemoglobin: 11.4 g/dL — ABNORMAL LOW (ref 12.0–15.0)
MCH: 29.8 pg (ref 26.0–34.0)
MCHC: 32.4 g/dL (ref 30.0–36.0)
MCV: 92.1 fL (ref 78.0–100.0)
PLATELETS: 293 10*3/uL (ref 150–400)
RBC: 3.82 MIL/uL — ABNORMAL LOW (ref 3.87–5.11)
RDW: 15 % (ref 11.5–15.5)
WBC: 13.5 10*3/uL — AB (ref 4.0–10.5)

## 2015-06-22 MED ORDER — COLLAGENASE 250 UNIT/GM EX OINT
TOPICAL_OINTMENT | Freq: Every day | CUTANEOUS | Status: DC
  Administered 2015-06-22: 11:00:00 via TOPICAL
  Administered 2015-06-23: 1 via TOPICAL
  Filled 2015-06-22: qty 30

## 2015-06-22 MED ORDER — POTASSIUM CHLORIDE 20 MEQ/15ML (10%) PO SOLN
40.0000 meq | ORAL | Status: AC
  Administered 2015-06-22 (×3): 40 meq via ORAL
  Filled 2015-06-22 (×3): qty 30

## 2015-06-22 NOTE — Care Management Important Message (Signed)
Important Message  Patient Details  Name: Misty Shah MRN: [id number] Date of Birth: 07-Sep-1915   Medicare Important Message Given:  Yes    [name] Abena 06/22/2015, 1:46 PM

## 2015-06-22 NOTE — Progress Notes (Signed)
Pharmacy Antibiotic Follow-up Note  Misty Shah is a 82 y.o. year-old female admitted on 05/31/2015.  The patient is currently on day 4 of vancomycin and zosyn for possible pneumonia. He is afebrile, but wbc has been trending up. Scr 0.77, stable, est. crcl ~ 30 ml/min.   Assessment/Plan: - Continue vancomycin 500 mg IV Q 24 hrs - Continue zosyn 3.375g IV Q 8 hrs - vancomycin trough tomorrow or Thursday if continues.  Temp (24hrs), Avg:98.2 F (36.8 C), Min:97.3 F (36.3 C), Max:98.9 F (37.2 C)   Recent Labs Lab 06/18/15 0504 06/19/15 0518 06/20/15 0625 06/21/15 0353 06/22/15 0350  WBC 6.6 12.1* 11.8* 12.5* 13.5*    Recent Labs Lab 06/17/15 0524 06/18/15 0504 06/20/15 0625 06/21/15 0353 06/22/15 0350  CREATININE 0.88 0.61 0.68 0.72 0.77   Estimated Creatinine Clearance: 28.9 mL/min (by C-G formula based on Cr of 0.77).    Allergies  Allergen Reactions  . Codeine Other (See Comments)    unknown  . Epinephrine Other (See Comments)    unknown  . Sulfur Other (See Comments)    unknown  . Tetanus Toxoids Other (See Comments)    unknown    Antimicrobials this admission: Vanc 1/21>> Zosyn 1/21>> Cefuroxime 1/16>>1/20  Levels/dose changes this admission: None  Microbiology results: 1/13 Urine - Kleb pneumo (R amp) completed ceftin x 3 days  Thank you for allowing pharmacy to be a part of this patient's care.  Bayard Hugger, PharmD, BCPS  Clinical Pharmacist  Pager: [phone]  06/22/2015 12:47 PM

## 2015-06-22 NOTE — Consult Note (Addendum)
WOC wound follow-up consult note Reason for Consult: Sacrum wound re-assessment. Pt is frequently incontinent of urine and stool and it is difficult to keep the location from becoming soiled.Pt is very thin and has protruding sacral bone to affected area. Wound type: Deep tissue pressure injury to sacrum Pressure Ulcer POA: No Measurement: .1.5X2cm Wound bed: Previous deep tissue pressure injury has evolved into unstageable; 75% yellow slough tightly adhered and 25% red Drainage (amount, consistency, odor) Small amt yellow drainage, no odor Periwound: Red macerated skin surrounding with partial thickness skin loss, appearance consistent with moisture associated skin loss. Dressing procedure/placement/frequency: Santyl ointment to provide enzymatic debridement of nonviable tissue. Reposition off affected area as often as possible to reduce pressure, and air mattress replacement for bed. Pt has multiple systemic factors which can impair healing. No family at bedside to discuss plan of care. Please re-consult if further assistance is needed. Thank-you,  Cammie Mcgee MSN, RN, CWOCN, [place], CNS [phone]

## 2015-06-22 NOTE — Progress Notes (Signed)
   I briefly saw the patient today, her heart rate looks much better with current medications. Likely has her illness improves this would improve as well.  My understanding is that she should be discharged tomorrow.  She likely does not require cardiology follow-up as an outpatient, however if her primary care physician would prefer for this to manage by a cardiologist, we will be happy to schedule her to see me in follow-up.  I will provide a card for her tomorrow.   Marykay Lex, M.D., M.S. Interventional Cardiologist   Pager # [phone] Phone # [phone] [address] [place], [place] [address]

## 2015-06-22 NOTE — Progress Notes (Addendum)
PROGRESS NOTE    Misty Shah ZOX:[id number] DOB: June 30, 1915 DOA: 06/05/2015 PCP: Michiel Sites, MD  HPI/Brief narrative 82 year old female, lives alone, ambulates with the help of a walker, history of HTN, hypothyroid, anxiety, presented to [facility] ED on 06/22/2015 following a mechanical fall at home when she tried to open her refrigerator door that felt stuck. She sustained this fall on 06/02/15 but was unable to see her physician until 06/29/2015 and was "roughing it out" with the help of her son. X-ray of the hip confirmed a displaced left superior and inferior pubic rami fractures. Orthopedics consulted and recommended nonoperative management. PT evaluated and recommended SNF. Social worker consulted and awaiting SNF bed availability, prior to discharge patient had complaints of abdominal pain, CT abdomen pelvis significant for Large acute/subacute hematoma in the prevesical space subsequent to recently demonstrated displaced fractures of the left pubic rami, required 2 units PRBC transfusion, as well as hospital stay was complicated by HCAP , and developed new onset A. fib with RVR required transfer to stepdown unit for Cardizem drip.  Assessment/Plan:   Left hip superior and inferior pubic rami fracture, displaced - Orthopedic follow-up appreciated. Recommends partial weightbearing on the left, pain management, PT and OT evaluation and possible DC to SNF. - PT recommends SNF. Per orthopedics  Anemia secondary to blood loss from hematoma at left pubic ramus fracture - Patient is lying hemoglobin 12.8 as an outpatient, was 10.1 on admission, 7.3 on 1/19, transfused 2 units PRBC 1/19, hemoglobin been stable since - resumed subcutaneous heparin for DVT prophylaxis, monitor hemoglobin closely  Hypoxic respiratory failure/HCAP - Patient with hypoxia, tachypnea and  respiratory distress 1/21, chest x-ray showing by apical infiltrate, low-grade temperature, tachycardia, tachypnea and leukocytosis. -   started on IV vancomycin and Zosyn for HCAP 1/21. - Continue with incentive spirometry and flutter valve, continue with pulmonary toiland chest PT - Continue with gentle diuresis.  New Onset A. Fib - Patient was noticed to be tachycardic, was in A. fib with RVR on telemetry,CHADS2VASC2 score >2, but not a  candidate for anticoagulation giving her recent fall and hematoma. - Required Cardizem drip initially,In addition to by mouth metoprolol,  better controlled after adding Cardizem CD , - cardiology consult appreciated  Klebsiella pneumoniae UTI - Confirmed on urine culture. Patient denies dysuria. Completed 3 days of oral Ceftin.  Hyponatremia -resolved  Essential hypertension - Acceptable, continue with metoprolol.  Dehydration - Resolved after IV fluids. DC IV fluids. Resumed diet.  Hypothyroid - Continue Synthroid.  Pressure ulcer - Wound Care consult appreciated  Anxiety - When necessary Valium  RBBB - Not new.  Hypokalemia/hypomagnesemia - Repleted, recheck in a.m. phosphorus is borderline, will start on by mouth supplement.    DVT prophylaxis: [place] heparin Code Status: Full Family Communication: D/W son via phone 1/24 Disposition Plan: DC to SNF when respiratory status improves    Consultants:  Orthopedics  Procedures:  Foley- DC'ed 1/15  Transfused 2 units PRBC  Antimicrobials:  None   Subjective: Patient states that her left hip is controlled, complains of cough . Subjective: Filed Vitals:   06/22/15 0406 06/22/15 0810 06/22/15 0849 06/22/15 1152  BP:  120/66  132/67  Pulse:  123  74  Temp: 97.3 F (36.3 C)  98.9 F (37.2 C) 98 F (36.7 C)  TempSrc: Oral  Oral Oral  Resp:    22  Height:      Weight:      SpO2:    100%  Intake/Output Summary (Last 24 hours) at 06/22/15 1356 Last data filed at 06/22/15 1021  Gross per 24 hour  Intake    400 ml  Output      0 ml  Net    400 ml   Filed Weights   06/21/2015 2145  Weight: 51.5 kg  (113 lb 8.6 oz)    Exam:  General exam: Pleasant elderly frail female patient lying comfortably in bed. Respiratory system: Clear. No wheezing, good air entry bilaterally, coarse bilaterally. Cardiovascular system: S1 & S2 heard, irregular. No JVD, murmurs, gallops, clicks or pedal edema. Gastrointestinal system: Abdomen is nondistended, soft , minimal diffuse tenderness. Normal bowel sounds heard. Central nervous system: Alert and oriented. No focal neurological deficits. Extremities: Symmetric 5 x 5 power. Minimal bruising over left hip & left groin site.   Data Reviewed: Basic Metabolic Panel:  Recent Labs Lab 06/17/15 0524 06/18/15 0504 06/20/15 0625 06/21/15 0353 06/22/15 0350  NA 131* 133* 132* 136 138  K 4.5 3.6 4.3 3.0* 2.9*  CL 100* 102 100* 98* 97*  CO2 24 25 21* 28 32  GLUCOSE 115* 108* 112* 111* 129*  BUN CREATININE 0.88 0.61 0.68 0.72 0.77  CALCIUM 8.0* 8.0* 8.5* 8.5* 8.3*  MG  --   --   --  1.6*  --   PHOS  --   --   --  2.5  --    Liver Function Tests: No results for input(s): AST, ALT, ALKPHOS, BILITOT, PROT, ALBUMIN in the last 168 hours. No results for input(s): LIPASE, AMYLASE in the last 168 hours. No results for input(s): AMMONIA in the last 168 hours. CBC:  Recent Labs Lab 06/18/15 0504 06/19/15 0518 06/20/15 0625 06/21/15 0353 06/22/15 0350  WBC 6.6 12.1* 11.8* 12.5* 13.5*  HGB 10.5* 11.6* 11.0* 11.3* 11.4*  HCT 30.6* 34.1* 32.4* 33.4* 35.2*  MCV 89.0 89.3 90.5 90.5 92.1  PLT 163 203 205 251 293   Cardiac Enzymes: No results for input(s): CKTOTAL, CKMB, CKMBINDEX, TROPONINI in the last 168 hours. BNP (last 3 results) No results for input(s): PROBNP in the last 8760 hours. CBG: No results for input(s): GLUCAP in the last 168 hours.  Recent Results (from the past 240 hour(s))  MRSA PCR Screening     Status: None   Collection Time: 06/20/15 12:54 AM  Result Value Ref Range Status   MRSA by PCR NEGATIVE NEGATIVE Final     Comment:        The GeneXpert MRSA Assay (FDA approved for NASAL specimens only), is one component of a comprehensive MRSA colonization surveillance program. It is not intended to diagnose MRSA infection nor to guide or monitor treatment for MRSA infections.          Studies: No results found.      Scheduled Meds: . sodium chloride   Intravenous Once  . collagenase   Topical Daily  . diltiazem  120 mg Oral Daily  . feeding supplement  1 Container Oral BID BM  . heparin subcutaneous  5,000 Units Subcutaneous Q12H  . levothyroxine  100 mcg Oral QAC breakfast  . metoprolol  100 mg Oral BID  . phosphorus  250 mg Oral BID  . piperacillin-tazobactam (ZOSYN)  IV  3.375 g Intravenous Q8H  . potassium chloride  40 mEq Oral Q3H  . vancomycin  500 mg Intravenous Q24H   Continuous Infusions:    Active Problems:   Hypertension   Hypothyroidism   Fracture  of multiple pubic rami (HCC)   Dehydration   Pressure ulcer   Acute blood loss anemia   Hyponatremia   Hypoxia   Atrial fibrillation (HCC)   HCAP (healthcare-associated pneumonia)    Time spent: 30 minutes    Zidane Renner, MD. [facility] Pager [phone]  If 7PM-7AM, please contact night-coverage [email or web address] Password [email or web address] 06/22/2015, 1:56 PM    LOS: 11 days

## 2015-06-22 NOTE — Progress Notes (Signed)
Vest therapy done with no complications.

## 2015-06-22 NOTE — Progress Notes (Signed)
Chest vest delayed this am due to pt eating.

## 2015-06-22 NOTE — Progress Notes (Signed)
Chest vest completed with no compilations.

## 2015-06-22 NOTE — Progress Notes (Signed)
Pt could only tolerate 5 mins of vest therapy. No distress noted at this time.

## 2015-06-23 DIAGNOSIS — I481 Persistent atrial fibrillation: Secondary | ICD-10-CM

## 2015-06-23 DIAGNOSIS — Z7189 Other specified counseling: Secondary | ICD-10-CM

## 2015-06-23 DIAGNOSIS — J9601 Acute respiratory failure with hypoxia: Secondary | ICD-10-CM

## 2015-06-23 DIAGNOSIS — Z515 Encounter for palliative care: Secondary | ICD-10-CM | POA: Insufficient documentation

## 2015-06-23 DIAGNOSIS — R0600 Dyspnea, unspecified: Secondary | ICD-10-CM

## 2015-06-23 DIAGNOSIS — E871 Hypo-osmolality and hyponatremia: Secondary | ICD-10-CM

## 2015-06-23 MED ORDER — GLYCOPYRROLATE 1 MG PO TABS: 1.0000 mg | ORAL_TABLET | ORAL | Status: AC | PRN

## 2015-06-23 MED ORDER — GLYCOPYRROLATE 0.2 MG/ML IJ SOLN
0.2000 mg | INTRAMUSCULAR | Status: DC | PRN
  Administered 2015-06-23: 0.2 mg via INTRAVENOUS
  Filled 2015-06-23 (×3): qty 1

## 2015-06-23 MED ORDER — DIGOXIN 0.25 MG/ML IJ SOLN
0.2500 mg | Freq: Once | INTRAMUSCULAR | Status: AC
Start: 2015-06-23 — End: 2015-06-23
  Administered 2015-06-23: 0.25 mg via INTRAVENOUS
  Filled 2015-06-23 (×2): qty 1

## 2015-06-23 MED ORDER — GLYCOPYRROLATE 1 MG PO TABS
1.0000 mg | ORAL_TABLET | ORAL | Status: DC | PRN
  Filled 2015-06-23: qty 1

## 2015-06-23 MED ORDER — MORPHINE SULFATE 20 MG/5ML PO SOLN
2.5000 mg | ORAL | Status: AC | PRN
Start: 2015-06-23 — End: ?

## 2015-06-23 MED ORDER — ONDANSETRON 4 MG PO TBDP
4.0000 mg | ORAL_TABLET | Freq: Four times a day (QID) | ORAL | Status: DC | PRN
  Filled 2015-06-23: qty 1

## 2015-06-23 MED ORDER — MORPHINE SULFATE (PF) 2 MG/ML IV SOLN: 1.0000 mg | INTRAVENOUS | Status: DC | PRN

## 2015-06-23 MED ORDER — GLYCOPYRROLATE 0.2 MG/ML IJ SOLN
0.2000 mg | INTRAMUSCULAR | Status: DC | PRN
  Filled 2015-06-23: qty 1

## 2015-06-23 MED ORDER — SENNOSIDES-DOCUSATE SODIUM 8.6-50 MG PO TABS: 1.0000 | ORAL_TABLET | Freq: Two times a day (BID) | ORAL | Status: AC

## 2015-06-23 MED ORDER — LORAZEPAM 2 MG/ML IJ SOLN
1.0000 mg | INTRAMUSCULAR | Status: DC | PRN
  Administered 2015-06-23: 1 mg via INTRAVENOUS
  Filled 2015-06-23: qty 1

## 2015-06-23 MED ORDER — LORAZEPAM 2 MG/ML PO CONC: 1.0000 mg | ORAL | Status: DC | PRN

## 2015-06-23 MED ORDER — LORAZEPAM 1 MG PO TABS: 1.0000 mg | ORAL_TABLET | ORAL | Status: DC | PRN

## 2015-06-23 MED ORDER — ONDANSETRON HCL 4 MG/2ML IJ SOLN: 4.0000 mg | Freq: Four times a day (QID) | INTRAMUSCULAR | Status: DC | PRN

## 2015-06-23 MED ORDER — ALBUTEROL SULFATE (2.5 MG/3ML) 0.083% IN NEBU: 2.5000 mg | INHALATION_SOLUTION | RESPIRATORY_TRACT | Status: DC | PRN

## 2015-06-23 MED ORDER — GUAIFENESIN 100 MG/5ML PO SOLN: 5.0000 mL | ORAL | Status: AC | PRN

## 2015-06-23 MED ORDER — POLYVINYL ALCOHOL 1.4 % OP SOLN
1.0000 [drp] | Freq: Four times a day (QID) | OPHTHALMIC | Status: DC | PRN
  Filled 2015-06-23: qty 15

## 2015-06-23 MED ORDER — LORAZEPAM 2 MG/ML PO CONC: 1.0000 mg | ORAL | Status: AC | PRN

## 2015-06-23 MED ORDER — HYDROMORPHONE HCL 1 MG/ML IJ SOLN
0.5000 mg | INTRAMUSCULAR | Status: DC | PRN
  Administered 2015-06-23 (×2): 0.5 mg via INTRAVENOUS
  Filled 2015-06-23 (×2): qty 1

## 2015-06-23 MED ORDER — ALBUTEROL SULFATE (2.5 MG/3ML) 0.083% IN NEBU: 2.5000 mg | INHALATION_SOLUTION | RESPIRATORY_TRACT | Status: AC | PRN

## 2015-06-30 NOTE — Progress Notes (Signed)
   07/11/2015 1500  Clinical Encounter Type  Visited With Family  Visit Type Patient actively dying;Critical Care  Referral From Nurse  Spiritual Encounters  Spiritual Needs Emotional;Grief support  Ch referred by RN to visit with family as pt moved to comfort care; family Renato Gails enroute; [facility] available as needed for additional support; grief and emotional support given. Erline Levine 4:00 PM

## 2015-06-30 NOTE — Progress Notes (Signed)
PT Cancellation Note and Discharge  Patient Details Name: Misty Shah MRN: [id number] DOB: May 08, 1916   Cancelled Treatment:    Reason Eval/Treat Not Completed: Medical issues which prohibited therapy;Other (comment); Patient declining and now on comfort.   [name] 07-17-15, 9:40 AM  [name], PT [phone] 07/17/2015

## 2015-06-30 NOTE — Progress Notes (Signed)
Dyspnea improved, patient body posture more relaxed.  Family at bedside.  Support provided.

## 2015-06-30 NOTE — Progress Notes (Signed)
Brief death summary:  Cause of death : - Cardiopulmonary arrest  Secondary to  - Pneumonia  Secondary to  - Pelvic fracture   Please review discharge summary earlier dictated by me ,was to discharge to hospice facility today, patient was unstable for  transport, she was kept in hospital , passed away, family at bedside. [facility]

## 2015-06-30 NOTE — Progress Notes (Signed)
[facility] called at [facility].  Spoke with Daria Pastures.  Referral number [id number].  She stated patient would not be eligible for donations due to age.  Family did not want organ or tissue donation.

## 2015-06-30 NOTE — Progress Notes (Signed)
Called to room by family.  Patient without respirations or heart beat.  Confirmed by second RN, Sherene Sires.  MD notified via text.

## 2015-06-30 NOTE — Discharge Instructions (Signed)
-   Management as per hospice at facility - Patient on heart healthy diet with thin liquid, can liberalize diet for comfort. - CODE STATUS DO NOT RESUSCITATE

## 2015-06-30 NOTE — Progress Notes (Signed)
Spoke with medical examiner via telephone.

## 2015-06-30 NOTE — Progress Notes (Signed)
Pt displaying "guppy breaths" (agonal). Patient medicated for comfort.  Son, [name], in room and updated on patient's condition.

## 2015-06-30 NOTE — Progress Notes (Signed)
NCM spoke with Nicholos Johns with [facility], she does not feel like patient is  Able to be transported, patient is actively passing, she has also spoke with CSW, Erie Noe, and the MD has been notified as well.  Patient will not be transferred today.

## 2015-06-30 NOTE — Discharge Summary (Signed)
Misty Shah, is a 82 y.o. female  DOB March 16, 1916  MRN [id number].  Admission date:  06/14/2015  Admitting Physician  Misty Hartshorn, MD  Discharge Date:  2015-06-27   Primary MD  Misty Sites, MD  Recommendations for primary care physician for things to follow:  - Management as per hospice at facility - Patient on heart healthy diet with thin liquid, can liberalize diet for comfort. - CODE STATUS DO NOT RESUSCITATE    Admission Diagnosis  Fall against object, initial encounter [W18.09XA]   Discharge Diagnosis  Fall against object, initial encounter [W18.09XA]    Active Problems:   Hypertension   Hypothyroidism   Fracture of multiple pubic rami (HCC)   Dehydration   Pressure ulcer   Acute blood loss anemia   Hyponatremia   Hypoxia   Atrial fibrillation (HCC)   HCAP (healthcare-associated pneumonia)      Past Medical History  Diagnosis Date  . Hypertension   . Hypothyroidism   . UTI (lower urinary tract infection)   . Anxiety   . Claustrophobia   . Dislocated shoulder     hx bilateral shoulders - had therapy on them  . Shingles   . Fracture of ramus of left pubis (HCC) 05/2015    displaced left superior and inferior pubic rami fractures.  . Macular degeneration of both eyes   . Arthritis     "all over"    Past Surgical History  Procedure Laterality Date  . Cholecystectomy    . Thyroidectomy    . Tonsillectomy    . Appendectomy    . Hip fracture surgery Right 10/2001    Manipulation and compression screw fixation, right hip fracture/notes 10/11/2010  . Colonoscopy    . Cataract extraction, bilateral    . Femur im nail  09/25/2011    Procedure: INTRAMEDULLARY (IM) NAIL FEMORAL;  Surgeon: Misty Batman, MD;  Location: [facility];  Service: Orthopedics;  Laterality: Left;  left hip   . Fracture surgery    . Cataract extraction w/ intraocular lens  implant, bilateral Bilateral          History of present illness and  Hospital Course:     Kindly see H&P for history of present illness and admission details, please review complete Labs, Consult reports and Test reports for all details in brief  HPI  from the history and physical done on the day of admission 06/19/2015 82 year old female with a history of hypertension, hypothyroidism, anxiety presented with a mechanical fall that occurred on 06/02/2015 when she tried to open her refrigerator door that was stuck closed. Because of the inclement weather, the patient did not make it to see her physician until today, 06/18/2015. She has been trying to "rough it out" with the help of her son who has been helping her with transfers and getting to the bathroom. However, the patient's pain did not remit. As a result, she went to see her primary care provider. X-rays revealed a pelvic fracture. She was sent to the emergency  department for further evaluation. Patient denies fevers, chills, headache, chest pain, dyspnea, nausea, vomiting, diarrhea, abdominal pain, dysuria, hematuria, hematochezia, melena. At baseline, the patient uses a walker to ambulate. She lives at home by herself prior to her injury. In the emergency department, BMP was essentially unremarkable except for serum creatinine 0.95. CBC was unremarkable except for hemoglobin 10.1. Urinalysis showed 6-30 WBC. Chest x-ray was negative. Left hip x-ray showed displaced left superior and inferior pubic rami fractures.   Hospital Course   82 year old female, lives alone, ambulates with the help of a walker, history of HTN, hypothyroid, anxiety, presented to [facility] ED on 15-Jun-2015 following a mechanical fall at home when she tried to open her refrigerator door that felt stuck. She sustained this fall on 06/02/15 but was unable to see her physician until 2015-06-15 and was "roughing it out" with the help of her son. X-ray of the hip confirmed a displaced left superior and inferior pubic  rami fractures. Orthopedics consulted and recommended nonoperative management. PT evaluated and recommended SNF. Social worker consulted and awaiting SNF bed availability, prior to discharge patient had complaints of abdominal pain, CT abdomen pelvis significant for Large acute/subacute hematoma in the prevesical space subsequent to recently demonstrated displaced fractures of the left pubic rami, required 2 units PRBC transfusion, as well as hospital stay was complicated by HCAP , and developed new onset A. fib with RVR required transfer to stepdown unit for Cardizem drip. Transitioned to by mouth Cardizem and metoprolol, remains was transient episode of uncontrolled A. Fib, as well patient continues to have worsening respiratory status, developed acute hypoxic respiratory failure requiring oxygen, remains with very poor cough effort, no significant improvement despite aggressive chest PT, patient with continuous decline, discussed with HPOA her son Misty Shah, and this point decision was made for comfort care , given her overall poor prognosis, severe debility and multiple comorbidities.  Left hip superior and inferior pubic rami fracture, displaced - Orthopedic follow-up appreciated. Recommends partial weightbearing on the left, pain management.  Anemia secondary to blood loss from hematoma at left pubic ramus fracture - Patient is lying hemoglobin 12.8 as an outpatient, was 10.1 on admission, 7.3 on 1/19, transfused 2 units PRBC 1/19, hemoglobin been stable since  Hypoxic respiratory failure/HCAP - Patient with hypoxia, tachypnea and respiratory distress 1/21, chest x-ray showing by apical infiltrate, low-grade temperature, tachycardia, tachypnea and leukocytosis. - Treated with IV vancomycin and Zosyn for HCAP 1/21>1/25. - Respiratory status continues to deteriorate, patient with very poor cough, was on pulmonary toilet, chest PT, flutter valve,. - At this point patient [name] was comfort care, will  keep on when necessary Ativan and morphine for dyspnea  New Onset A. Fib - Patient was noticed to be tachycardic, was in A. fib with RVR on telemetry,CHADS2VASC2 score >2, but not a candidate for anticoagulation giving her recent fall and hematoma. - Required Cardizem drip initially, started on metoprolol and Cardizem CD, seen by cardiology, continues to have intermittent episodes of uncontrolled heart rate mainly due to respiratory distress. - Currently off medication as she is comfort care - cardiology consult appreciated  Klebsiella pneumoniae UTI - Confirmed on urine culture. Patient denies dysuria. Completed 3 days of oral Ceftin.  Hyponatremia -resolved  Essential hypertension   Dehydration  Hypothyroid  Pressure ulcer - Wound Care consult appreciated  Anxiety - When necessary Ativan  RBBB - Not new.  Hypokalemia/hypomagnesemia     Discharge Condition:  Days to couple weeks  Follow UP  Discharge Instructions  and  Discharge Medications     Discharge Instructions    Diet - low sodium heart healthy    Complete by:  As directed      Discharge instructions    Complete by:  As directed   - Management as per hospice at facility - Patient on heart healthy diet with thin liquid, can liberalize diet for comfort. - CODE STATUS DO NOT RESUSCITATE     Increase activity slowly    Complete by:  As directed      Weight bearing as tolerated    Complete by:  As directed             Medication List    STOP taking these medications        amLODipine 5 MG tablet  Commonly known as:  NORVASC     aspirin EC 81 MG tablet     diazepam 2 MG tablet  Commonly known as:  VALIUM     diclofenac sodium 1 % Gel  Commonly known as:  VOLTAREN     HYDROcodone-acetaminophen 5-325 MG tablet  Commonly known as:  NORCO/VICODIN     levothyroxine 100 MCG tablet  Commonly known as:  SYNTHROID, LEVOTHROID     metoprolol 100 MG tablet  Commonly known as:  LOPRESSOR      potassium chloride 10 MEQ tablet  Commonly known as:  K-DUR,KLOR-CON     SYSTANE FREE OP     traMADol 50 MG tablet  Commonly known as:  ULTRAM      TAKE these medications        albuterol (2.5 MG/3ML) 0.083% nebulizer solution  Commonly known as:  PROVENTIL  Take 3 mLs (2.5 mg total) by nebulization every 2 (two) hours as needed for wheezing.     furosemide 20 MG tablet  Commonly known as:  LASIX  Take 1 tablet (20 mg total) by mouth daily.     glycopyrrolate 1 MG tablet  Commonly known as:  ROBINUL  Take 1 tablet (1 mg total) by mouth every 4 (four) hours as needed (excessive secretions).     guaiFENesin 100 MG/5ML Soln  Commonly known as:  ROBITUSSIN  Take 5 mLs (100 mg total) by mouth every 4 (four) hours as needed for cough or to loosen phlegm.     LORazepam 2 MG/ML concentrated solution  Commonly known as:  ATIVAN  Place 0.5 mLs (1 mg total) under the tongue every 2 (two) hours as needed for anxiety (dyspnea).     morphine 20 MG/5ML solution  Take 0.6 mLs (2.4 mg total) by mouth every 2 (two) hours as needed for pain.     senna-docusate 8.6-50 MG tablet  Commonly known as:  Senokot-S  Take 1 tablet by mouth 2 (two) times daily.          Diet and Activity recommendation: See Discharge Instructions above   Consults obtained -  Orthopedic Cardiology   Major procedures and Radiology Reports - PLEASE review detailed and final reports for all details, in brief -    Transfused 2 units PRBC  Ct Abdomen Pelvis Wo Contrast  06/16/2015  CLINICAL DATA:  Abdominal pain since injury on 06/02/2015. Known pelvic fractures. EXAM: CT ABDOMEN AND PELVIS WITHOUT CONTRAST TECHNIQUE: Multidetector CT imaging of the abdomen and pelvis was performed following the standard protocol without IV contrast. COMPARISON:  Abdominal and hip radiographs 06/04/2015. FINDINGS: Lower chest: Small dependent pleural effusions bilaterally with associated bibasilar atelectasis. The heart  is  mildly enlarged. There are mitral annular calcifications. Hepatobiliary: The liver demonstrates no focal abnormality as imaged in the noncontrast state. The gallbladder is not clearly visualized, presumably surgically absent. No significant biliary dilatation. Pancreas: Diffusely atrophied without apparent focal abnormality or surrounding inflammation. Spleen: Normal in size without focal abnormality. Adrenals/Urinary Tract: Both adrenal glands appear normal. There is a small nonobstructing calculus in the upper pole of the right kidney. There are several sub cm hyperdense renal lesions bilaterally. No hydronephrosis or ureteral calculus. Mild bladder wall thickening is present. Stomach/Bowel: No evidence of bowel wall thickening, distention or surrounding inflammatory change. Diverticular changes of the distal colon noted. Vascular/Lymphatic: There are no enlarged abdominal or pelvic lymph nodes. Moderate atherosclerosis of the aorta, its branches and the iliac arteries. Reproductive: The uterus is atrophied.  No evidence of adnexal mass. Other: There is a large hematoma extending superiorly from the left pubic rami fractures. This is located deep to the anterior abdominal wall musculature, in the pre vesicle space. This hematoma measures approximately 14.6 x 6.3 x 8.2 cm. No significant rectus sheath or pelvic sidewall hematoma. No evidence of hemoperitoneum. Musculoskeletal: As seen on recent radiographs, there are acute moderately displaced fractures of the left superior and inferior pubic rami. There is a probable nondisplaced fracture of the left sacrum. There are old fractures of the right pubic rami. Patient is status post bilateral proximal femoral ORIF. There is a chronic inferior endplate compression fracture at L3. IMPRESSION: 1. Large acute/subacute hematoma in the prevesical space subsequent to recently demonstrated displaced fractures of the left pubic rami. 2. No evidence of hemoperitoneum. 3.  Bilateral pleural effusions and bibasilar atelectasis. 4. Small hyperdense renal lesions of doubtful significance. 5. These results were called by telephone at the time of interpretation on 06/16/2015 at 9:41 pm to the patient's nurse, Clarita Crane, who verbally acknowledged these results. Electronically Signed   By: Carey Bullocks M.D.   On: 06/16/2015 21:41   Dg Chest 1 View  06/20/2015  CLINICAL DATA:  Fall. EXAM: CHEST 1 VIEW COMPARISON:  June 01, 2015. FINDINGS: Stable cardiomediastinal silhouette. Moderate dextroscoliosis of thoracic spine is noted. No pneumothorax or pleural effusion is noted. Degenerative change of right glenohumeral joint is noted. No acute pulmonary disease is noted. IMPRESSION: No acute cardiopulmonary abnormality seen. Electronically Signed   By: Lupita Raider, M.D.   On: 06/18/2015 18:25   Dg Chest 1 View  06/01/2015  CLINICAL DATA:  Shortness of breath last night and today. EXAM: CHEST 1 VIEW COMPARISON:  10/10/2011 FINDINGS: There is hyperinflation of the lungs compatible with COPD. Cardiomegaly. Diffuse interstitial opacities throughout the lungs, most likely edema. Bibasilar opacities likely reflect atelectasis. Small effusions. IMPRESSION: Suspect mild CHF.  Bibasilar atelectasis with small effusions. Electronically Signed   By: Charlett Nose M.D.   On: 06/01/2015 14:58   Dg Chest Port 1 View  06/19/2015  CLINICAL DATA:  Shortness of breath for 1 week EXAM: PORTABLE CHEST - 1 VIEW COMPARISON:  06/04/2015 FINDINGS: Cardiac shadow is stable. Biapical infiltrates are noted right greater than left new from the prior exam. No sizable effusion is seen. No acute bony abnormality is noted. IMPRESSION: Biapical infiltrates right greater than left. Electronically Signed   By: Alcide Clever M.D.   On: 06/19/2015 08:44   Dg Abd Portable 1v  06/16/2015  CLINICAL DATA:  Generalized abdominal pain. EXAM: PORTABLE ABDOMEN - 1 VIEW COMPARISON:  None. FINDINGS: The bowel gas pattern is  normal. No radio-opaque calculi are seen. Surgical  sutures are seen in the right side of the abdomen. Postsurgical changes are seen involving both proximal femurs. IMPRESSION: No evidence of bowel obstruction or ileus. Electronically Signed   By: Lupita Raider, M.D.   On: 06/16/2015 09:24   Dg Hip Unilat With Pelvis 2-3 Views Left  05/31/2015  CLINICAL DATA:  Left hip pain after fall today. EXAM: DG HIP (WITH OR WITHOUT PELVIS) 2-3V LEFT COMPARISON:  September 25, 2011. FINDINGS: Status post surgical internal fixation of both proximal femurs. The are noted displaced fractures involving the left superior and inferior pubic rami. No acute abnormality seen involving either hip joint or proximal femur. Joint spaces appear to be intact. Diffuse osteopenia is noted. IMPRESSION: Status post surgical internal fixation of both proximal femurs. Displaced left superior and inferior pubic rami fractures are noted. Electronically Signed   By: Lupita Raider, M.D.   On: 06/26/2015 18:24    Micro Results    Recent Results (from the past 240 hour(s))  MRSA PCR Screening     Status: None   Collection Time: 06/20/15 12:54 AM  Result Value Ref Range Status   MRSA by PCR NEGATIVE NEGATIVE Final    Comment:        The GeneXpert MRSA Assay (FDA approved for NASAL specimens only), is one component of a comprehensive MRSA colonization surveillance program. It is not intended to diagnose MRSA infection nor to guide or monitor treatment for MRSA infections.        Today   Subjective:   Misty Shah today has no headache,no chest or abdominal pain, no significant events overnight . Objective:   Blood pressure 108/70, pulse 141, temperature 97.5 F (36.4 C), temperature source Oral, resp. rate 28, height 5' (1.524 m), weight 51.5 kg (113 lb 8.6 oz), SpO2 92 %.   Intake/Output Summary (Last 24 hours) at June 28, 2015 1033 Last data filed at 06/28/2015 1000  Gross per 24 hour  Intake    360 ml  Output      1 ml   Net    359 ml    Exam General exam: Pleasant elderly frail female patient lying comfortably in bed. Respiratory system: Clear. No wheezing, good air entry bilaterally, coarse bilaterally. Cardiovascular system: S1 & S2 heard, irregular. No JVD, murmurs, gallops, clicks or pedal edema. Gastrointestinal system: Abdomen is nondistended, soft , minimal diffuse tenderness. Normal bowel sounds heard. Central nervous system: Alert and oriented. No focal neurological deficits. Extremities: Symmetric 5 x 5 power. Minimal bruising over left hip & left groin site.  Data Review   CBC w Diff: Lab Results  Component Value Date   WBC 13.5* 06/22/2015   HGB 11.4* 06/22/2015   HCT 35.2* 06/22/2015   PLT 293 06/22/2015   LYMPHOPCT 16 06/18/2015   MONOPCT 12 06/03/2015   EOSPCT 7 06/02/2015   BASOPCT 1 06/25/2015    CMP: Lab Results  Component Value Date   NA 138 06/22/2015   K 2.9* 06/22/2015   CL 97* 06/22/2015   CO2 32 06/22/2015   BUN 14 06/22/2015   CREATININE 0.77 06/22/2015   PROT 5.3* 09/29/2011   ALBUMIN 2.3* 09/29/2011   BILITOT 0.6 09/29/2011   ALKPHOS 49 09/29/2011   AST 21 09/29/2011   ALT 9 09/29/2011  .   Total Time in preparing paper work, data evaluation and todays exam - 35 minutes  Misty Shah M.D on 06/28/2015 at 10:33 AM  [facility]   Office  [phone]

## 2015-06-30 NOTE — Care Management Note (Signed)
Case Management Note  Patient Details  Name: Misty Shah MRN: [id number] Date of Birth: 07/05/1915  Subjective/Objective:      Patient HR conts to go up, breathing increased, wob increased, dyspneic at rest, now is comfort care, plan is for residential hospice.  CSW following.               Action/Plan:   Expected Discharge Date:                  Expected Discharge Plan:  Hospice Medical Facility  In-House Referral:  Clinical Social Work  Discharge planning Services  CM Consult  Post Acute Care Choice:    Choice offered to:     DME Arranged:    DME Agency:     HH Arranged:    HH Agency:     Status of Service:  Completed, signed off  Medicare Important Message Given:  Yes Date Medicare IM Given:    Medicare IM give by:    Date Additional Medicare IM Given:    Additional Medicare Important Message give by:     If discussed at Long Length of Stay Meetings, dates discussed:    Additional Comments:  Leone Haven, RN 2015-07-21, 11:22 AM

## 2015-06-30 NOTE — Progress Notes (Signed)
    Had discussion this AM with Dr. Randol Kern -- was informed that he has been in discussion with the pt's sone - with persistent PNA & respiratory difficulty, the plan is to move to Comfort Care. He HR was elevated this AM in the setting of respiratory difficulty.  Now off of Tele.  We will sign off.  Marykay Lex, MD

## 2015-06-30 NOTE — Consult Note (Signed)
Consultation Note Date: 2015/07/15   Patient Name: Misty Shah  DOB: 18-Nov-1915  MRN: [id number]  Age / Sex: 82 y.o., female  PCP: Darci Needle, MD Referring Physician: Starleen Arms, MD  Reason for Consultation: Inpatient hospice referral    Clinical Assessment/Narrative:  [name] is a 82 yo lady who was living by herself prior to this hospitalization. She has history of HTN and A fib. She was trying to pull on a refrigerator door when she fell backwards. She was brought to the hospital, was found to have pelvic fractures. Hospital course has been complicated by HCAP, A fib with RVR. Patient continues to not eat, has uncontrolled pain and dyspnea tachypnea and tachycardia. It was noted that the patient would not be able to be D/C to SNF for rehab. Goals of care discussions ensued between nursing staff, primary team and the patient's sons Buddy ( HCPOA agent) and son Misty Shah. DNR DNI was established.   The patient is awake but not alert, she is visibly tachypneic and appears in mild to moderate distress. She is not able to verbalize meaningfully. She is using accessory muscles of respiration. She is not eating well either.   Goals of care discussions undertaken between the undersigned and the patient's son HCPOA agent Buddy. Agree that it is prudent to consider Hospice referral and consider transfer to inpatient hospice at this point. Prognosis appears to be days-1-2 weeks or so at this point.   Medication list reviewed. Will add IV Dilaudid PRN pain or dyspnea.  Agree with hospice consult, patient appropriate for consideration for inpatient hospice in my opinion.  DNR DNI reconfirmed with HCPOA son Buddy   Contacts/Participants in Discussion: Primary Decision Maker: son Relationship to Patient Misty Shah  HCPOA: yes     SUMMARY OF RECOMMENDATIONS  Medication list reviewed. Will add IV Dilaudid PRN pain or  dyspnea.  Agree with hospice consult, patient appropriate for consideration for inpatient hospice in my opinion.  DNR DNI reconfirmed with HCPOA son Buddy    Code Status/Advance Care Planning: DNR    Code Status Orders        Start     Ordered   07-15-2015 [phone]  Do not attempt resuscitation (DNR)   Continuous    Question Answer Comment  In the event of cardiac or respiratory ARREST Do not call a "code blue"   In the event of cardiac or respiratory ARREST Do not perform Intubation, CPR, defibrillation or ACLS   In the event of cardiac or respiratory ARREST Use medication by any route, position, wound care, and other measures to relive pain and suffering. May use oxygen, suction and manual treatment of airway obstruction as needed for comfort.      07-15-2015 0844    Code Status History    Date Active Date Inactive Code Status Order ID Comments User Context   06/10/2015  9:43 PM 2015-07-15  8:44 AM Full Code [id number]  Catarina Hartshorn, MD Inpatient   09/28/2011  7:06 PM 10/13/2011 12:42 PM Full Code [id number]  Philis Kendall, RN Inpatient   09/25/2011 11:21 PM 09/28/2011  7:06 PM Full Code [id number]  Dwan Bolt, RN Inpatient   09/25/2011  4:44 PM 09/25/2011 11:21 PM Full Code [id number]  Richarda Overlie, MD ED      Other Directives:Other  Symptom Management:    as above  Palliative Prophylaxis:   Delirium Protocol  Additional Recommendations (Limitations, Scope, Preferences):  Full Comfort Care     Psycho-social/Spiritual:  Support System: Fair Desire for further Chaplaincy support:no Additional Recommendations: Education on Hospice  Prognosis: < 2 weeks  Discharge Planning: hospice facility    Chief Complaint/ Primary Diagnoses: Present on Admission:  . Fracture of multiple pubic rami (HCC) . Hypertension . Hypothyroidism  I have reviewed the medical record, interviewed the patient and family, and examined the patient. The following aspects are pertinent.  Past Medical  History  Diagnosis Date  . Hypertension   . Hypothyroidism   . UTI (lower urinary tract infection)   . Anxiety   . Claustrophobia   . Dislocated shoulder     hx bilateral shoulders - had therapy on them  . Shingles   . Fracture of ramus of left pubis (HCC) 05/2015    displaced left superior and inferior pubic rami fractures.  . Macular degeneration of both eyes   . Arthritis     "all over"   Social History   Social History  . Marital Status: Widowed    Spouse Name: N/A  . Number of Children: N/A  . Years of Education: N/A   Social History Main Topics  . Smoking status: Never Smoker   . Smokeless tobacco: Never Used  . Alcohol Use: No  . Drug Use: No  . Sexual Activity: No   Other Topics Concern  . None   Social History Narrative   History reviewed. No pertinent family history. Scheduled Meds: . sodium chloride   Intravenous Once  . collagenase   Topical Daily  . feeding supplement  1 Container Oral BID BM   Continuous Infusions:  PRN Meds:.albuterol, bisacodyl, glycopyrrolate **OR** glycopyrrolate **OR** glycopyrrolate, guaiFENesin, HYDROmorphone (DILAUDID) injection, LORazepam **OR** LORazepam **OR** LORazepam, ondansetron **OR** ondansetron (ZOFRAN) IV, polyvinyl alcohol, traMADol Medications Prior to Admission:  Prior to Admission medications   Medication Sig Start Date End Date Taking? Authorizing Provider  amLODipine (NORVASC) 5 MG tablet Take 5 mg by mouth daily.   Yes Historical Provider, MD  aspirin EC 81 MG tablet Take 81 mg by mouth daily.   Yes Historical Provider, MD  furosemide (LASIX) 80 MG tablet Take 80 mg by mouth daily.   Yes Historical Provider, MD  levothyroxine (SYNTHROID, LEVOTHROID) 100 MCG tablet Take 100 mcg by mouth daily.   Yes Historical Provider, MD  metoprolol (LOPRESSOR) 100 MG tablet Take 100 mg by mouth 2 (two) times daily.   Yes Historical Provider, MD  Polyethyl Glycol-Propyl Glycol (SYSTANE FREE OP) Place 1 drop into both eyes  daily as needed.   Yes Historical Provider, MD  albuterol (PROVENTIL) (2.5 MG/3ML) 0.083% nebulizer solution Take 3 mLs (2.5 mg total) by nebulization every 2 (two) hours as needed for wheezing. 2015/07/17   Starleen Arms, MD  diclofenac sodium (VOLTAREN) 1 % GEL Apply 1 application topically 4 (four) times daily. Patient not taking: Reported on 06/22/2015 10/13/11   Mcarthur Rossetti Angiulli, PA-C  furosemide (LASIX) 20 MG tablet Take 1 tablet (20 mg total) by mouth daily. 06/16/15   Leana Roe Elgergawy, MD  glycopyrrolate (ROBINUL) 1 MG tablet Take 1 tablet (1 mg total) by mouth every 4 (four) hours as needed (excessive secretions). 2015/07/17   Leana Roe Elgergawy, MD  guaiFENesin (ROBITUSSIN) 100 MG/5ML SOLN Take 5 mLs (100 mg total) by mouth every 4 (four) hours as needed for cough or to loosen phlegm. 07-17-15   Starleen Arms, MD  HYDROcodone-acetaminophen (NORCO/VICODIN) 5-325 MG per tablet Take 1 or 2 po Q 6hrs for pain Patient not taking: Reported on 06/10/2015  09/15/12   Devoria Albe, MD  LORazepam (ATIVAN) 2 MG/ML concentrated solution Place 0.5 mLs (1 mg total) under the tongue every 2 (two) hours as needed for anxiety (dyspnea). 2015/07/05   Starleen Arms, MD  morphine 20 MG/5ML solution Take 0.6 mLs (2.4 mg total) by mouth every 2 (two) hours as needed for pain. 2015-07-05   Leana Roe Elgergawy, MD  senna-docusate (SENOKOT-S) 8.6-50 MG tablet Take 1 tablet by mouth 2 (two) times daily. 2015/07/05   Starleen Arms, MD   Allergies  Allergen Reactions  . Codeine Other (See Comments)    unknown  . Epinephrine Other (See Comments)    unknown  . Sulfur Other (See Comments)    unknown  . Tetanus Toxoids Other (See Comments)    unknown    Review of Systems Not able to verbalize  Physical Exam Frail elderly lady Coarse congested breath sounds anteriorly S1 S2 irregular Abdomen soft Trace edema Awake, mumbles incoherently  Vital Signs: BP 108/70 mmHg  Pulse 141  Temp(Src) 97.5 F (36.4 C)  (Oral)  Resp 28  Ht 5' (1.524 m)  Wt 51.5 kg (113 lb 8.6 oz)  BMI 22.17 kg/m2  SpO2 92%  SpO2: SpO2: 92 % O2 Device:SpO2: 92 % O2 Flow Rate: .O2 Flow Rate (L/min): 4 L/min  IO: Intake/output summary:  Intake/Output Summary (Last 24 hours) at 07-05-2015 1122 Last data filed at 07-05-15 1000  Gross per 24 hour  Intake    360 ml  Output      1 ml  Net    359 ml    LBM: Last BM Date: 06/22/15 Baseline Weight: Weight: 51.5 kg (113 lb 8.6 oz) Most recent weight: Weight: 51.5 kg (113 lb 8.6 oz)      Palliative Assessment/Data:  Flowsheet Rows        Most Recent Value   Intake Tab    Referral Department  Hospitalist   Unit at Time of Referral  ICU   Palliative Care Primary Diagnosis  Other (Comment)   Palliative Care Type  New Palliative care   Reason for referral  End of Life Care Assistance   Clinical Assessment    Palliative Performance Scale Score  10%   Pain Max last 24 hours  6   Pain Min Last 24 hours  5   Dyspnea Max Last 24 Hours  5   Dyspnea Min Last 24 hours  4   Psychosocial & Spiritual Assessment    Palliative Care Outcomes    Patient/Family meeting held?  Yes   Who was at the meeting?  son [name] HCPOA agent    Palliative Care Outcomes  Clarified goals of care   Palliative Care follow-up planned  Yes, Facility      Additional Data Reviewed:  CBC:    Component Value Date/Time   WBC 13.5* 06/22/2015 0350   HGB 11.4* 06/22/2015 0350   HCT 35.2* 06/22/2015 0350   PLT 293 06/22/2015 0350   MCV 92.1 06/22/2015 0350   NEUTROABS 5.9 06/22/2015 1743   LYMPHSABS 1.5 06/27/2015 1743   MONOABS 1.1* 06/16/2015 1743   EOSABS 0.6 06/28/2015 1743   BASOSABS 0.1 06/21/2015 1743   Comprehensive Metabolic Panel:    Component Value Date/Time   NA 138 06/22/2015 0350   K 2.9* 06/22/2015 0350   CL 97* 06/22/2015 0350   CO2 32 06/22/2015 0350   BUN 14 06/22/2015 0350   CREATININE 0.77 06/22/2015 0350   GLUCOSE 129* 06/22/2015 0350   CALCIUM  8.3* 06/22/2015 0350     AST 21 09/29/2011 0543   ALT 9 09/29/2011 0543   ALKPHOS 49 09/29/2011 0543   BILITOT 0.6 09/29/2011 0543   PROT 5.3* 09/29/2011 0543   ALBUMIN 2.3* 09/29/2011 0543     Time In: 10 Time Out: 11 Time Total: 60 min  Greater than 50%  of this time was spent counseling and coordinating care related to the above assessment and plan.  Signed by: Rosalin Hawking, MD [id number] Rosalin Hawking, MD  06/25/2015, 11:22 AM  Please contact Palliative Medicine Team phone at [phone] for questions and concerns.

## 2015-06-30 DEATH — deceased

## 2015-08-17 ENCOUNTER — Ambulatory Visit: Payer: Medicare Other | Admitting: Podiatry

## 2016-10-18 IMAGING — CT CT ABD-PELV W/O CM
2 of 4 series · 16 of 46 positions shown, 18 images · non-contrast
Comparison: Abdominal and hip radiographs 06/11/2015.

CLINICAL DATA: Abdominal pain since injury on 06/02/2015. Known
pelvic fractures.

EXAM:
CT ABDOMEN AND PELVIS WITHOUT CONTRAST
TECHNIQUE: Multidetector CT imaging of the abdomen and pelvis was performed
following the standard protocol without IV contrast.

[Series 2: a/p w/o 5mm · axial · non-contrast · 0.84mm/px · z∈[-384,-29]mm · 13 of 79 slices shown, 15 images]
[im 4/79  soft-tissue]
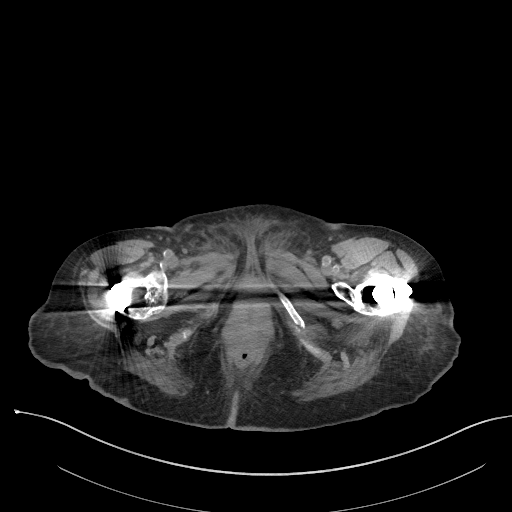
[im 4/79  bone]
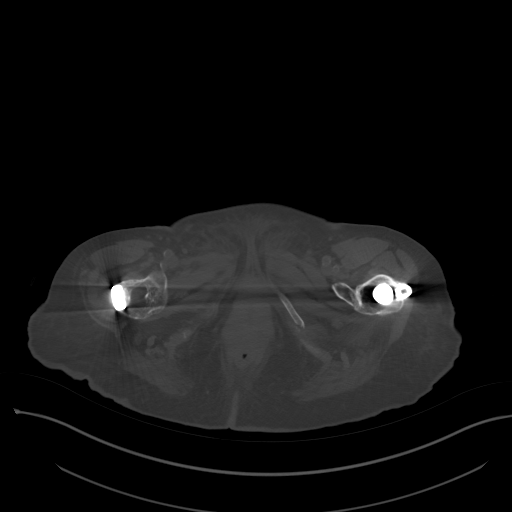
[im 10/79  soft-tissue]
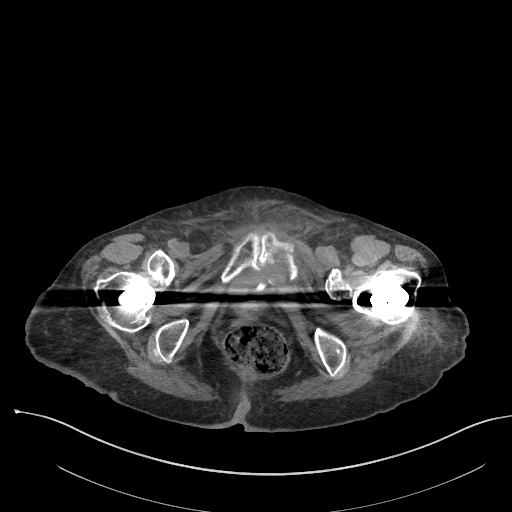
[im 16/79  soft-tissue]
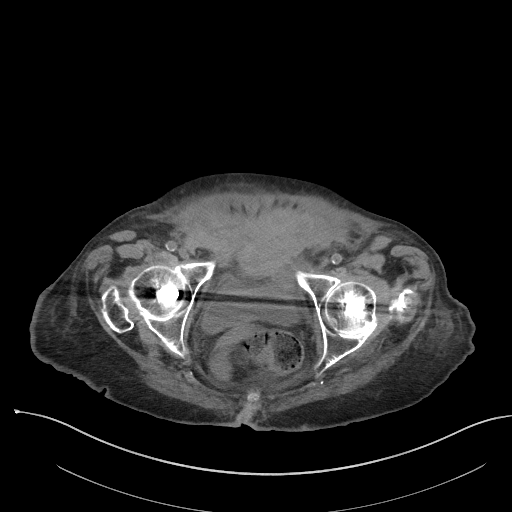
[im 22/79  soft-tissue]
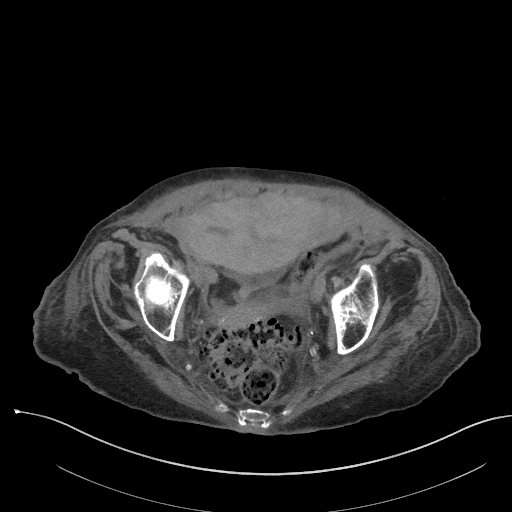
[im 29/79  soft-tissue]
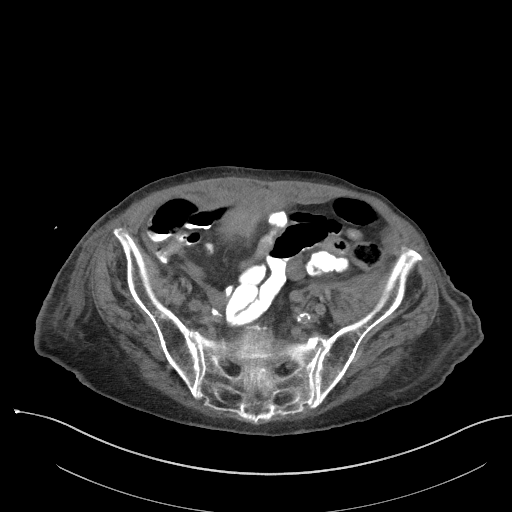
[im 35/79  soft-tissue]
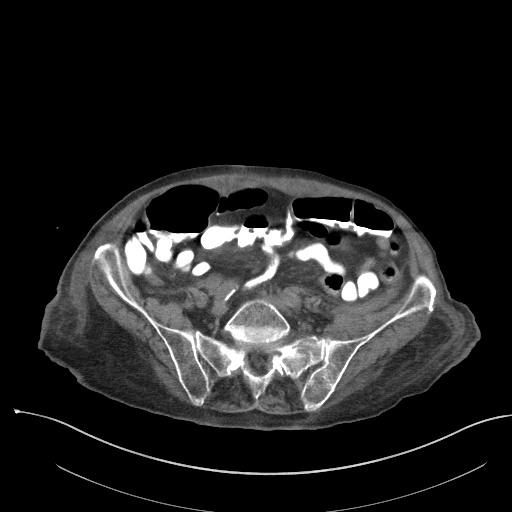
[im 41/79  soft-tissue]
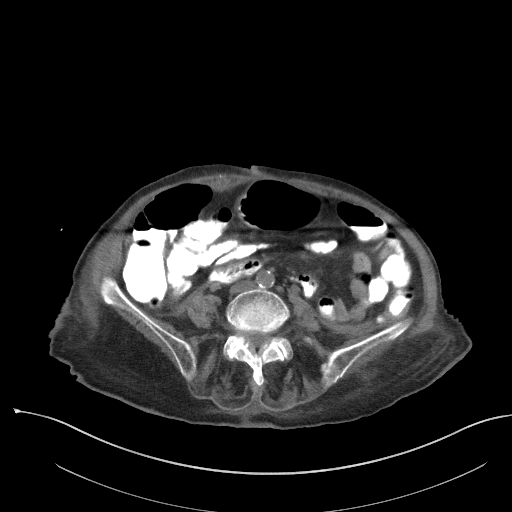
[im 44/79  soft-tissue]
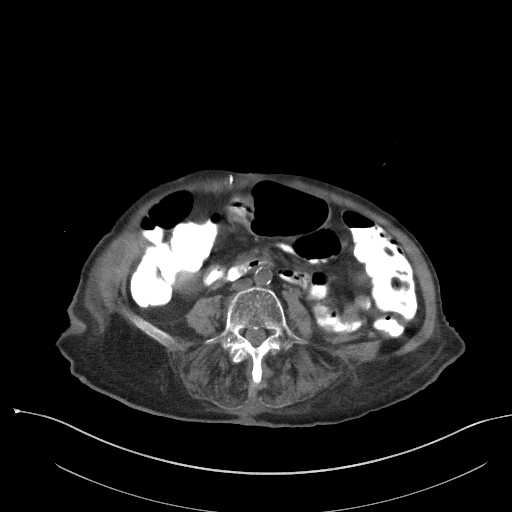
[im 50/79  soft-tissue]
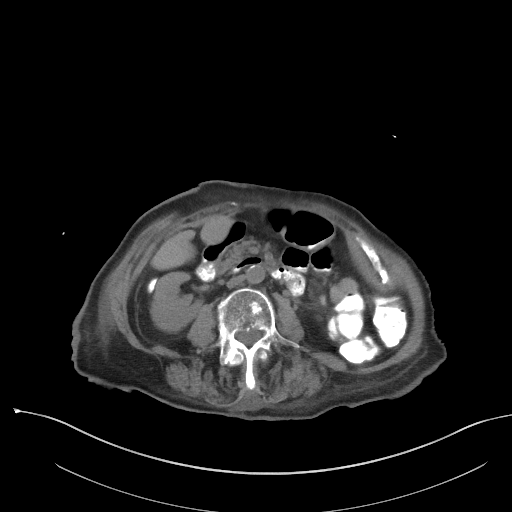
[im 50/79  bone]
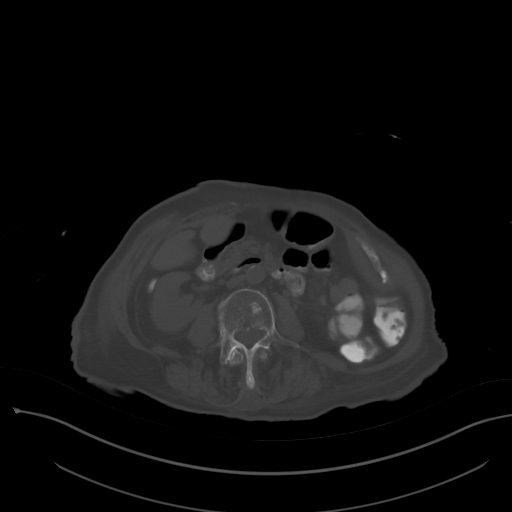
[im 57/79  soft-tissue]
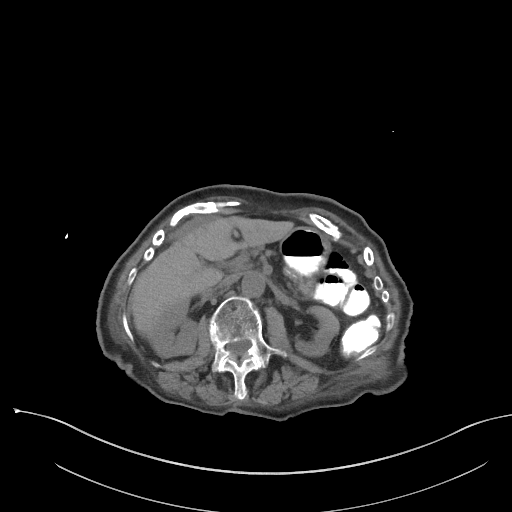
[im 63/79  soft-tissue]
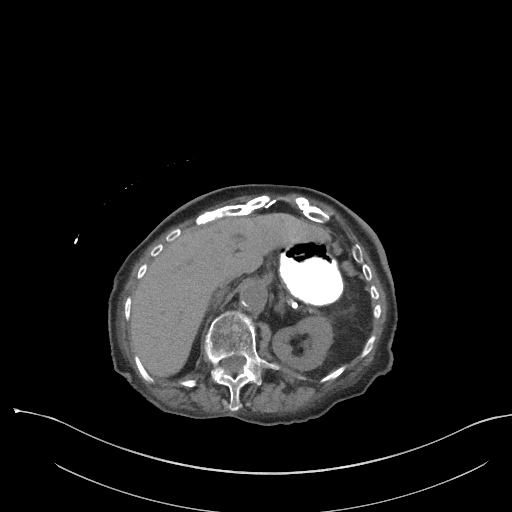
[im 69/79  soft-tissue]
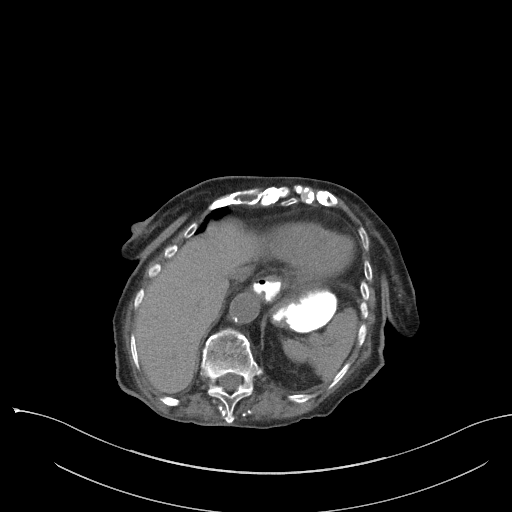
[im 75/79  soft-tissue]
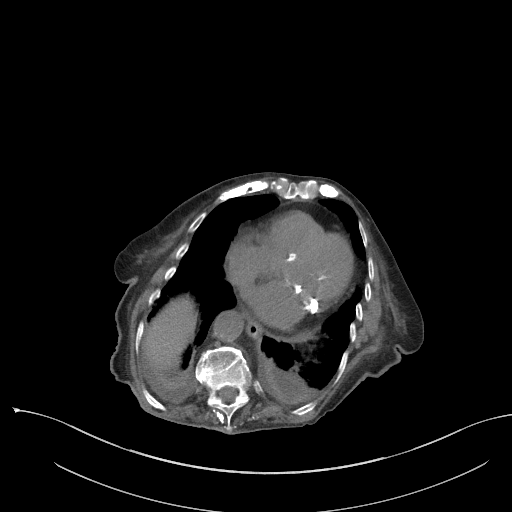

[Series 5: a/p w/o cor · coronal · non-contrast · 0.66mm/px · 3 of 118 slices shown]
[im 40/118  soft-tissue]
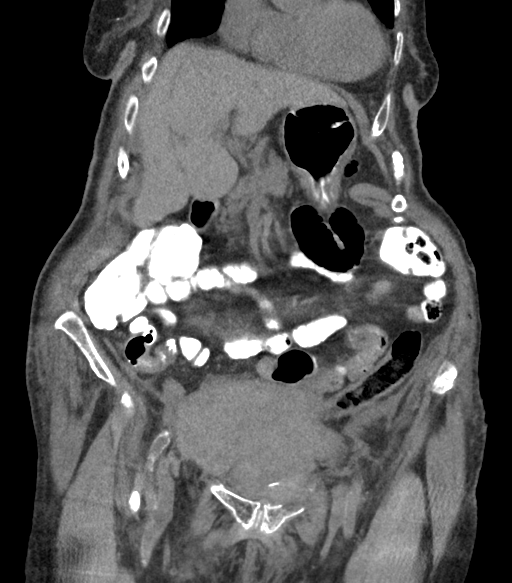
[im 53/118  soft-tissue]
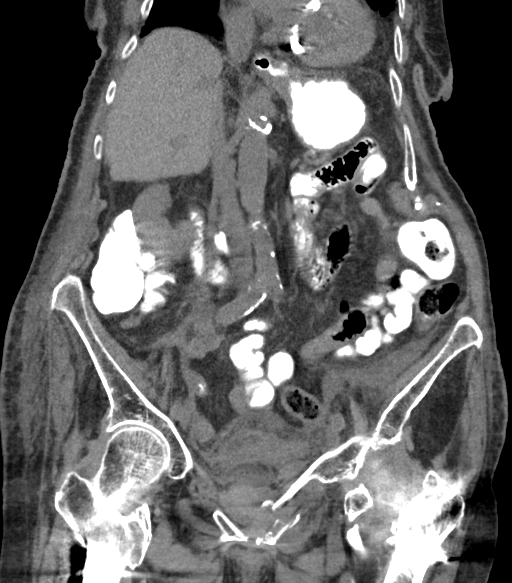
[im 66/118  soft-tissue]
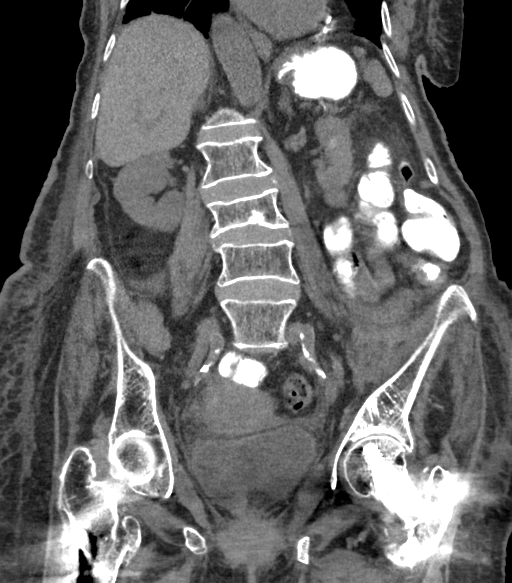

[16 of 46 positions shown; findings below may reference images not displayed]

FINDINGS: Lower chest: Small dependent pleural effusions bilaterally with
associated bibasilar atelectasis. The heart is mildly enlarged.
There are mitral annular calcifications.

Hepatobiliary: The liver demonstrates no focal abnormality as imaged
in the noncontrast state. The gallbladder is not clearly visualized,
presumably surgically absent. No significant biliary dilatation.

Pancreas: Diffusely atrophied without apparent focal abnormality or
surrounding inflammation.

Spleen: Normal in size without focal abnormality.

Adrenals/Urinary Tract: Both adrenal glands appear normal. There is
a small nonobstructing calculus in the upper pole of the right
kidney. There are several sub cm hyperdense renal lesions
bilaterally. No hydronephrosis or ureteral calculus. Mild bladder
wall thickening is present.

Stomach/Bowel: No evidence of bowel wall thickening, distention or
surrounding inflammatory change. Diverticular changes of the distal
colon noted.

Vascular/Lymphatic: There are no enlarged abdominal or pelvic lymph
nodes. Moderate atherosclerosis of the aorta, its branches and the
iliac arteries.

Reproductive: The uterus is atrophied.  No evidence of adnexal mass.

Other: There is a large hematoma extending superiorly from the left
pubic rami fractures. This is located deep to the anterior abdominal
wall musculature, in the pre vesicle space. This hematoma measures
approximately 14.6 x 6.3 x 8.2 cm. No significant rectus sheath or
pelvic sidewall hematoma. No evidence of hemoperitoneum.

Musculoskeletal: As seen on recent radiographs, there are acute
moderately displaced fractures of the left superior and inferior
pubic rami. There is a probable nondisplaced fracture of the left
sacrum. There are old fractures of the right pubic rami. Patient is
status post bilateral proximal femoral ORIF. There is a chronic
inferior endplate compression fracture at L3.
IMPRESSION: 1. Large acute/subacute hematoma in the prevesical space subsequent
to recently demonstrated displaced fractures of the left pubic rami.
2. No evidence of hemoperitoneum.
3. Bilateral pleural effusions and bibasilar atelectasis.
4. Small hyperdense renal lesions of doubtful significance.
5. These results were called by telephone at the time of
interpretation on 06/16/2015 at [DATE] to the patient's nurse,
Marianella, who verbally acknowledged these results.
